# Patient Record
Sex: Male | Born: 1960 | Race: Black or African American | Hispanic: No | Marital: Single | Smoking: Never smoker
Health system: Southern US, Community
[De-identification: ages and names within clinical notes are randomized; demographics above are authoritative.]

## PROBLEM LIST (undated history)

## (undated) DIAGNOSIS — F1910 Other psychoactive substance abuse, uncomplicated: Secondary | ICD-10-CM

## (undated) DIAGNOSIS — N400 Enlarged prostate without lower urinary tract symptoms: Secondary | ICD-10-CM

## (undated) DIAGNOSIS — F32A Depression, unspecified: Secondary | ICD-10-CM

## (undated) DIAGNOSIS — F329 Major depressive disorder, single episode, unspecified: Secondary | ICD-10-CM

---

## 2014-07-19 ENCOUNTER — Encounter (HOSPITAL_COMMUNITY): Payer: Self-pay | Admitting: Emergency Medicine

## 2014-07-19 ENCOUNTER — Emergency Department (HOSPITAL_COMMUNITY)
Admission: EM | Admit: 2014-07-19 | Discharge: 2014-07-19 | Disposition: A | Discharge status: Home / Self Care | Payer: Medicare Other | Attending: Emergency Medicine | Admitting: Emergency Medicine

## 2014-07-19 DIAGNOSIS — F329 Major depressive disorder, single episode, unspecified: Secondary | ICD-10-CM | POA: Diagnosis not present

## 2014-07-19 DIAGNOSIS — L299 Pruritus, unspecified: Secondary | ICD-10-CM | POA: Diagnosis not present

## 2014-07-19 DIAGNOSIS — M2548 Effusion, other site: Secondary | ICD-10-CM | POA: Insufficient documentation

## 2014-07-19 DIAGNOSIS — R609 Edema, unspecified: Secondary | ICD-10-CM | POA: Insufficient documentation

## 2014-07-19 DIAGNOSIS — R21 Rash and other nonspecific skin eruption: Secondary | ICD-10-CM | POA: Insufficient documentation

## 2014-07-19 DIAGNOSIS — F3289 Other specified depressive episodes: Secondary | ICD-10-CM | POA: Diagnosis not present

## 2014-07-19 DIAGNOSIS — Z79899 Other long term (current) drug therapy: Secondary | ICD-10-CM | POA: Diagnosis not present

## 2014-07-19 HISTORY — DX: Major depressive disorder, single episode, unspecified: F32.9

## 2014-07-19 HISTORY — DX: Depression, unspecified: F32.A

## 2014-07-19 LAB — COMPREHENSIVE METABOLIC PANEL
ALT: 23 U/L (ref 0–53)
AST: 24 U/L (ref 0–37)
Albumin: 3.8 g/dL (ref 3.5–5.2)
Alkaline Phosphatase: 39 U/L (ref 39–117)
Anion gap: 14 (ref 5–15)
BILIRUBIN TOTAL: 0.3 mg/dL (ref 0.3–1.2)
BUN: 14 mg/dL (ref 6–23)
CHLORIDE: 101 meq/L (ref 96–112)
CO2: 26 meq/L (ref 19–32)
Calcium: 9.4 mg/dL (ref 8.4–10.5)
Creatinine, Ser: 1.05 mg/dL (ref 0.50–1.35)
GFR calc Af Amer: >90 mL/min (ref >90)
GFR, EST NON AFRICAN AMERICAN: 79 mL/min — AB (ref >90)
Glucose, Bld: 117 mg/dL — ABNORMAL HIGH (ref 70–99)
POTASSIUM: 3.8 meq/L (ref 3.7–5.3)
Sodium: 141 mEq/L (ref 137–147)
Total Protein: 6.8 g/dL (ref 6.0–8.3)

## 2014-07-19 NOTE — ED Notes (Signed)
Pt having bil ankle edema starting last week.  PA at bedside for assessment.

## 2014-07-19 NOTE — Discharge Instructions (Signed)
Please read and follow all provided instructions.  Your diagnoses today include:  1. Peripheral edema     Tests performed today include:  Blood test showing normal liver and kidney function  Vital signs. See below for your results today.   Medications prescribed:   None  Take any prescribed medications only as directed.  Home care instructions:  Follow any educational materials contained in this packet.  Keep legs elevated.  Go to the drug store and get a pair of compression stockings.   BE VERY CAREFUL not to take multiple medicines containing Tylenol (also called acetaminophen). Doing so can lead to an overdose which can damage your liver and cause liver failure and possibly death.   Follow-up instructions: Please follow-up with your primary care provider in the next 3 days for further evaluation of your symptoms.   Return instructions:   Please return to the Emergency Department if you experience worsening symptoms.   Please return if you have any other emergent concerns.  Additional Information:  Your vital signs today were: BP 125/78   Pulse 78   Temp(Src) 98.4 F (36.9 C) (Oral)   Resp 20   SpO2 99% If your blood pressure (BP) was elevated above 135/85 this visit, please have this repeated by your doctor within one month. --------------

## 2014-07-19 NOTE — ED Provider Notes (Signed)
CSN: [id number]     Arrival date & time 07/19/14  1018 History   First MD Initiated Contact with Patient 07/19/14 1020     Chief Complaint  Patient presents with  . Joint Swelling     (Consider location/radiation/quality/duration/timing/severity/associated sxs/prior Treatment) HPI Comments: Patient presents with complaint of bilateral ankle swelling for the past one week. He denies acute injury to these areas. Patient was released from prison one week ago. He had been in prison for one year. She states that since then he has been on his feet and his been walking frequently. He has been sleeping in a chair because there are bed bugs at the place where he is staying and does not want to get bit. He is not having any difficulty breathing or shortness of breath with activity. He does not have orthopnea. Patient also notes a diet change and states that he's been eating salty foods since he was released from prison. He is on no new medications including calcium channel blockers. He has not had any recent surgeries or prolonged immobilization or travel. No history of blood clots. He has no history of kidney or liver failure. Patient does not have pain with ambulation. He does not have redness of the skin of his ankles or lower extremities. Onset of symptoms gradual. Course is constant. Nothing makes symptoms better or worse.  The history is provided by the patient.    Past Medical History  Diagnosis Date  . Depression    History reviewed. No pertinent past surgical history. History reviewed. No pertinent family history. History  Substance Use Topics  . Smoking status: Never Smoker   . Smokeless tobacco: Not on file  . Alcohol Use: No    Review of Systems  Constitutional: Negative for fever.  HENT: Negative for rhinorrhea and sore throat.   Eyes: Negative for redness.  Respiratory: Negative for cough.   Cardiovascular: Negative for chest pain.  Gastrointestinal: Negative for nausea,  vomiting, abdominal pain and diarrhea.  Genitourinary: Negative for dysuria.  Musculoskeletal: Positive for joint swelling. Negative for arthralgias and myalgias.  Skin: Positive for rash (itchy, lower extremities).  Neurological: Negative for headaches.    Allergies  Review of patient's allergies indicates no known allergies.  Home Medications   Prior to Admission medications   Medication Sig Start Date End Date Taking? Authorizing Provider  acetaminophen (TYLENOL) 500 MG tablet Take 1,000 mg by mouth every 6 (six) hours as needed for moderate pain.   Yes Historical Provider, MD  citalopram (CELEXA) 20 MG tablet Take 20 mg by mouth daily.   Yes Historical Provider, MD  hydrOXYzine (VISTARIL) 25 MG capsule Take 25 mg by mouth daily.   Yes Historical Provider, MD   BP 125/78  Pulse 78  Temp(Src) 98.4 F (36.9 C) (Oral)  Resp 20  SpO2 99%  Physical Exam  Nursing note and vitals reviewed. Constitutional: He appears well-developed and well-nourished.  HENT:  Head: Normocephalic and atraumatic.  Eyes: Conjunctivae are normal. Right eye exhibits no discharge. Left eye exhibits no discharge.  Neck: Normal range of motion. Neck supple.  Cardiovascular: Normal rate, regular rhythm and normal heart sounds.   Pulmonary/Chest: Effort normal and breath sounds normal.  Abdominal: Soft. There is no tenderness.  Musculoskeletal:       Right knee: Normal.       Left knee: Normal.       Right ankle: He exhibits swelling. He exhibits normal range of motion. No tenderness. No lateral malleolus and  no medial malleolus tenderness found. Achilles tendon exhibits no pain.       Left ankle: He exhibits swelling. He exhibits normal range of motion. No tenderness. No lateral malleolus and no medial malleolus tenderness found. Achilles tendon normal.       Right lower leg: He exhibits no edema.       Left lower leg: He exhibits no edema.       Right foot: He exhibits normal range of motion, no  tenderness and no bony tenderness.       Left foot: He exhibits normal range of motion, no tenderness and no bony tenderness.  Neurological: He is alert.  Skin: Skin is warm and dry.  Psychiatric: He has a normal mood and affect.    ED Course  Procedures (including critical care time) Labs Review Labs Reviewed  COMPREHENSIVE METABOLIC PANEL    Imaging Review No results found.   EKG Interpretation None      10:50 AM Patient seen and examined. Work-up initiated.   Vital signs reviewed and are as follows: Filed Vitals:   07/19/14 1029  BP: 125/78  Pulse: 78  Temp: 98.4 F (36.9 C)  Resp: 20   Patient states that he has not had general physical in a while. I see no signs of congestive heart failure on exam. Given lack of followup, will check kidney and liver functions to ensure no kidney or liver failure. I feel likely symptoms are due to increase salt intake as well as venous insufficiency. Patient agrees with plan.  12:38 PM CMP okay. Will d/c to home. Counseled on elevation, compression stockings. Decrease salt intake. Hydrate well. Follow-up with PCP, referrals given.  MDM   Final diagnoses:  Peripheral edema   No signs of congestive heart failure. No signs of cirrhosis or nephrotic syndrome. Suspect swelling is due to patient's increase salt intake and him standing up for long periods of time. He should establish care with PCP. No emergency condition suspect at this time. Will discharge to home with conservative measures.    Renne CriglerJoshua Arshia Rondon, PA-C 07/19/14 1239

## 2014-07-20 NOTE — ED Provider Notes (Signed)
Medical screening examination/treatment/procedure(s) were performed by non-physician practitioner and as supervising physician I was immediately available for consultation/collaboration.   Kadyn Chovan David Karmin Kasprzak III, MD 07/20/14 0831 

## 2014-10-19 ENCOUNTER — Encounter (HOSPITAL_COMMUNITY): Payer: Self-pay | Admitting: Emergency Medicine

## 2014-10-19 ENCOUNTER — Emergency Department (INDEPENDENT_AMBULATORY_CARE_PROVIDER_SITE_OTHER)
Admission: EM | Admit: 2014-10-19 | Discharge: 2014-10-19 | Disposition: A | Discharge status: Home / Self Care | Payer: Medicare Other | Source: Home / Self Care | Attending: Family Medicine | Admitting: Family Medicine

## 2014-10-19 DIAGNOSIS — F3178 Bipolar disorder, in full remission, most recent episode mixed: Secondary | ICD-10-CM

## 2014-10-19 DIAGNOSIS — M25562 Pain in left knee: Secondary | ICD-10-CM

## 2014-10-19 MED ORDER — DICLOFENAC SODIUM 75 MG PO TBEC: 75.0000 mg | DELAYED_RELEASE_TABLET | Freq: Two times a day (BID) | ORAL | Status: DC

## 2014-10-19 MED ORDER — DICLOFENAC SODIUM 1 % TD GEL: 2.0000 g | Freq: Four times a day (QID) | TRANSDERMAL | Status: DC

## 2014-10-19 NOTE — ED Provider Notes (Signed)
CSN: [id number]     Arrival date & time 10/19/14  1450 History   First MD Initiated Contact with Patient 10/19/14 1535     Chief Complaint  Patient presents with  . Knee Pain   (Consider location/radiation/quality/duration/timing/severity/associated sxs/prior Treatment) HPI  Knee pain: L. Sw[facility]. Mild pain w/ prolonged walking. Worse w/ full hard extension. Hot tub w/ benefit. Recently started working out from being sedentary. Went really hard on treadmilll and bike. Has not taken any medications for the pain. No change in overall symptoms.   Bipolar: on celexa and hydroxysine prn. No recent mania. Has psych in town. Would like to avoid any medications that may change his mood.   Past Medical History  Diagnosis Date  . Depression    History reviewed. No pertinent past surgical history. No family history on file. History  Substance Use Topics  . Smoking status: Never Smoker   . Smokeless tobacco: Not on file  . Alcohol Use: No    Review of Systems Per HPI with all other pertinent systems negative.   Allergies  Review of patient's allergies indicates no known allergies.  Home Medications   Prior to Admission medications   Medication Sig Start Date End Date Taking? Authorizing Provider  acetaminophen (TYLENOL) 500 MG tablet Take 1,000 mg by mouth every 6 (six) hours as needed for moderate pain.    Historical Provider, MD  citalopram (CELEXA) 20 MG tablet Take 20 mg by mouth daily.    Historical Provider, MD  diclofenac (VOLTAREN) 75 MG EC tablet Take 1 tablet (75 mg total) by mouth 2 (two) times daily. 10/19/14   Ozella Rocksavid J Shyniece Scripter, MD  diclofenac sodium (VOLTAREN) 1 % GEL Apply 2-4 g topically 4 (four) times daily. 10/19/14   Ozella Rocksavid J Jakyiah Briones, MD  hydrOXYzine (VISTARIL) 25 MG capsule Take 25 mg by mouth daily.    Historical Provider, MD   BP 128/87  Pulse 72  Temp(Src) 97.8 F (36.6 C) (Oral)  Resp 16  SpO2 98% Physical Exam   Constitutional: He is oriented to person, place, and time. He appears well-developed and well-nourished.  HENT:  Head: Normocephalic and atraumatic.  Eyes: EOM are normal. Pupils are equal, round, and reactive to light.  Neck: Normal range of motion.  Cardiovascular: Normal rate and regular rhythm.   Pulmonary/Chest: Effort normal. No respiratory distress.  Abdominal: He exhibits no distension.  Musculoskeletal: Normal range of motion. He exhibits no tenderness.  Neurological: He is alert and oriented to person, place, and time. No cranial nerve deficit. Coordination normal.  Skin: Skin is warm.  Psychiatric: He has a normal mood and affect. His behavior is normal. Judgment and thought content normal.    ED Course  Procedures (including critical care time) Labs Review Labs Reviewed - No data to display  Imaging Review No results found.   MDM   1. Knee pain, acute, left   2. Bipolar 1 disorder, mixed, full remission    Knee pain: exam consistent w./ mild meniscal injury vs MCL strain.  Voltaren (PO or gel - not at the same time), rehab exercises diwcussed (no steroids), f/u new pcp if not better in 2-4 wks for imaging - Prilosec or zantac prn Gastritis  Bipolar: pt currently stable and happy w/ current regimen. Close f/u w/ psych PRN. No steroids at this time.   Precautions given and all questions answered   Shelly Flattenavid Mayli Covington, MD Family Medicine 10/19/2014, 4:10 PM   -  Bipolar    Ozella Rocksavid J Rad Gramling, MD 10/19/14 [phone]

## 2014-10-19 NOTE — ED Notes (Signed)
Patient c/o  Left knee pain onset about a week ago. Patient reports he does work out a lot. Patient states his trainer told him he may have sprained it or torn a ligament. Patient ambulated to room ok. NAD.

## 2014-10-19 NOTE — Discharge Instructions (Signed)
You knee pain is likely due to either a smal meniscal injury or a mild MCL strain Please start the voltaren twice a day (if this bothers your stomach then start an acid reducer such as prilosec or only use the voltaren gel).  Please stop your aerobic weight bearing activities for 2 weeks then return to activity slowly. Please start twice daily range of motion exercises (leg extensions) of both legs. 3 sets of 15.  Please come back or follow up at your new pcp in 2 weeks Please call you insurance to confirm your new doctor Please apply ice to your knee 2-4 times per day for 20-30 min for the next 48 hours. Then apply heat

## 2015-02-08 DIAGNOSIS — Z79899 Other long term (current) drug therapy: Secondary | ICD-10-CM | POA: Diagnosis not present

## 2015-02-08 DIAGNOSIS — Z5181 Encounter for therapeutic drug level monitoring: Secondary | ICD-10-CM | POA: Diagnosis not present

## 2015-03-06 DIAGNOSIS — Z5181 Encounter for therapeutic drug level monitoring: Secondary | ICD-10-CM | POA: Diagnosis not present

## 2015-03-06 DIAGNOSIS — Z79899 Other long term (current) drug therapy: Secondary | ICD-10-CM | POA: Diagnosis not present

## 2015-05-13 DIAGNOSIS — Z23 Encounter for immunization: Secondary | ICD-10-CM | POA: Diagnosis not present

## 2015-05-31 DIAGNOSIS — Z79899 Other long term (current) drug therapy: Secondary | ICD-10-CM | POA: Diagnosis not present

## 2015-05-31 DIAGNOSIS — Z5181 Encounter for therapeutic drug level monitoring: Secondary | ICD-10-CM | POA: Diagnosis not present

## 2015-06-10 DIAGNOSIS — Z23 Encounter for immunization: Secondary | ICD-10-CM | POA: Diagnosis not present

## 2015-06-23 ENCOUNTER — Encounter (HOSPITAL_COMMUNITY): Payer: Self-pay | Admitting: Emergency Medicine

## 2015-06-23 ENCOUNTER — Emergency Department (HOSPITAL_COMMUNITY)
Admission: EM | Admit: 2015-06-23 | Discharge: 2015-06-23 | Disposition: A | Discharge status: Home / Self Care | Payer: Medicaid Other | Attending: Emergency Medicine | Admitting: Emergency Medicine

## 2015-06-23 ENCOUNTER — Emergency Department (HOSPITAL_COMMUNITY): Payer: Medicaid Other

## 2015-06-23 DIAGNOSIS — Y998 Other external cause status: Secondary | ICD-10-CM | POA: Insufficient documentation

## 2015-06-23 DIAGNOSIS — Y9389 Activity, other specified: Secondary | ICD-10-CM | POA: Diagnosis not present

## 2015-06-23 DIAGNOSIS — Z791 Long term (current) use of non-steroidal anti-inflammatories (NSAID): Secondary | ICD-10-CM | POA: Diagnosis not present

## 2015-06-23 DIAGNOSIS — F329 Major depressive disorder, single episode, unspecified: Secondary | ICD-10-CM | POA: Insufficient documentation

## 2015-06-23 DIAGNOSIS — Y92410 Unspecified street and highway as the place of occurrence of the external cause: Secondary | ICD-10-CM | POA: Insufficient documentation

## 2015-06-23 DIAGNOSIS — S8002XA Contusion of left knee, initial encounter: Secondary | ICD-10-CM | POA: Diagnosis not present

## 2015-06-23 DIAGNOSIS — Z79899 Other long term (current) drug therapy: Secondary | ICD-10-CM | POA: Insufficient documentation

## 2015-06-23 DIAGNOSIS — S8992XA Unspecified injury of left lower leg, initial encounter: Secondary | ICD-10-CM | POA: Diagnosis present

## 2015-06-23 MED ORDER — NAPROXEN 500 MG PO TABS: 500.0000 mg | ORAL_TABLET | Freq: Two times a day (BID) | ORAL | Status: DC
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
## 2015-06-23 NOTE — Discharge Instructions (Signed)
Cryotherapy °Cryotherapy means treatment with cold. Ice or gel packs can be used to reduce both pain and swelling. Ice is the most helpful within the first 24 to 48 hours after an injury or flare-up from overusing a muscle or joint. Sprains, strains, spasms, burning pain, shooting pain, and aches can all be eased with ice. Ice can also be used when recovering from surgery. Ice is effective, has very few side effects, and is safe for most people to use. °PRECAUTIONS  °Ice is not a safe treatment option for people with: °· Raynaud phenomenon. This is a condition affecting small blood vessels in the extremities. Exposure to cold may cause your problems to return. °· Cold hypersensitivity. There are many forms of cold hypersensitivity, including: °· Cold urticaria. Red, itchy hives appear on the skin when the tissues begin to warm after being iced. °· Cold erythema. This is a red, itchy rash caused by exposure to cold. °· Cold hemoglobinuria. Red blood cells break down when the tissues begin to warm after being iced. The hemoglobin that carry oxygen are passed into the urine because they cannot combine with blood proteins fast enough. °· Numbness or altered sensitivity in the area being iced. °If you have any of the following conditions, do not use ice until you have discussed cryotherapy with your caregiver: °· Heart conditions, such as arrhythmia, angina, or chronic heart disease. °· High blood pressure. °· Healing wounds or open skin in the area being iced. °· Current infections. °· Rheumatoid arthritis. °· Poor circulation. °· Diabetes. °Ice slows the blood flow in the region it is applied. This is beneficial when trying to stop inflamed tissues from spreading irritating chemicals to surrounding tissues. However, if you expose your skin to cold temperatures for too long or without the proper protection, you can damage your skin or nerves. Watch for signs of skin damage due to cold. °HOME CARE INSTRUCTIONS °Follow  these tips to use ice and cold packs safely. °· Place a dry or damp towel between the ice and skin. A damp towel will cool the skin more quickly, so you may need to shorten the time that the ice is used. °· For a more rapid response, add gentle compression to the ice. °· Ice for no more than 10 to 20 minutes at a time. The bonier the area you are icing, the less time it will take to get the benefits of ice. °· Check your skin after 5 minutes to make sure there are no signs of a poor response to cold or skin damage. °· Rest 20 minutes or more between uses. °· Once your skin is numb, you can end your treatment. You can test numbness by very lightly touching your skin. The touch should be so light that you do not see the skin dimple from the pressure of your fingertip. When using ice, most people will feel these normal sensations in this order: cold, burning, aching, and numbness. °· Do not use ice on someone who cannot communicate their responses to pain, such as small children or people with dementia. °HOW TO MAKE AN ICE PACK °Ice packs are the most common way to use ice therapy. Other methods include ice massage, ice baths, and cryosprays. Muscle creams that cause a cold, tingly feeling do not offer the same benefits that ice offers and should not be used as a substitute unless recommended by your caregiver. °To make an ice pack, do one of the following: °· Place crushed ice or a   bag of frozen vegetables in a sealable plastic bag. Squeeze out the excess air. Place this bag inside another plastic bag. Slide the bag into a pillowcase or place a damp towel between your skin and the bag. °· Mix 3 parts water with 1 part rubbing alcohol. Freeze the mixture in a sealable plastic bag. When you remove the mixture from the freezer, it will be slushy. Squeeze out the excess air. Place this bag inside another plastic bag. Slide the bag into a pillowcase or place a damp towel between your skin and the bag. °SEEK MEDICAL CARE  IF: °· You develop white spots on your skin. This may give the skin a blotchy (mottled) appearance. °· Your skin turns blue or pale. °· Your skin becomes waxy or hard. °· Your swelling gets worse. °MAKE SURE YOU:  °· Understand these instructions. °· Will watch your condition. °· Will get help right away if you are not doing well or get worse. °Document Released: 08/10/2011 Document Revised: 04/30/2014 Document Reviewed: 08/10/2011 °Exit[employer]® Patient Information ©2015 [employer]. This information is not intended to replace advice given to you by your health care provider. Make sure you discuss any questions you have with your health care provider. ° °Contusion °A contusion is a deep bruise. Contusions are the result of an injury that caused bleeding under the skin. The contusion may turn blue, purple, or yellow. Minor injuries will give you a painless contusion, but more severe contusions may stay painful and swollen for a few weeks.  °CAUSES  °A contusion is usually caused by a blow, trauma, or direct force to an area of the body. °SYMPTOMS  °· Swelling and redness of the injured area. °· Bruising of the injured area. °· Tenderness and soreness of the injured area. °· Pain. °DIAGNOSIS  °The diagnosis can be made by taking a history and physical exam. An X-ray, CT scan, or MRI may be needed to determine if there were any associated injuries, such as fractures. °TREATMENT  °Specific treatment will depend on what area of the body was injured. In general, the best treatment for a contusion is resting, icing, elevating, and applying cold compresses to the injured area. Over-the-counter medicines may also be recommended for pain control. Ask your caregiver what the best treatment is for your contusion. °HOME CARE INSTRUCTIONS  °· Put ice on the injured area. °¨ Put ice in a plastic bag. °¨ Place a towel between your skin and the bag. °¨ Leave the ice on for 15-20 minutes, 3-4 times a day, or as directed by your health  care provider. °· Only take over-the-counter or prescription medicines for pain, discomfort, or fever as directed by your caregiver. Your caregiver may recommend avoiding anti-inflammatory medicines (aspirin, ibuprofen, and naproxen) for 48 hours because these medicines may increase bruising. °· Rest the injured area. °· If possible, elevate the injured area to reduce swelling. °SEEK IMMEDIATE MEDICAL CARE IF:  °· You have increased bruising or swelling. °· You have pain that is getting worse. °· Your swelling or pain is not relieved with medicines. °MAKE SURE YOU:  °· Understand these instructions. °· Will watch your condition. °· Will get help right away if you are not doing well or get worse. °Document Released: 09/23/2005 Document Revised: 12/19/2013 Document Reviewed: 10/19/2011 °ExitCare® Patient Information ©2015 [employer]. This information is not intended to replace advice given to you by your health care provider. Make sure you discuss any questions you have with your health care provider. ° °

## 2015-08-07 DIAGNOSIS — Z79899 Other long term (current) drug therapy: Secondary | ICD-10-CM | POA: Diagnosis not present

## 2015-08-07 DIAGNOSIS — Z5181 Encounter for therapeutic drug level monitoring: Secondary | ICD-10-CM | POA: Diagnosis not present

## 2015-08-13 ENCOUNTER — Encounter (HOSPITAL_COMMUNITY): Payer: Self-pay | Admitting: Emergency Medicine

## 2015-08-13 ENCOUNTER — Emergency Department (HOSPITAL_COMMUNITY): Payer: Medicaid Other

## 2015-08-13 ENCOUNTER — Emergency Department (HOSPITAL_COMMUNITY)
Admission: EM | Admit: 2015-08-13 | Discharge: 2015-08-13 | Disposition: A | Discharge status: Home / Self Care | Payer: Medicaid Other | Attending: Emergency Medicine | Admitting: Emergency Medicine

## 2015-08-13 DIAGNOSIS — Y9389 Activity, other specified: Secondary | ICD-10-CM | POA: Insufficient documentation

## 2015-08-13 DIAGNOSIS — F329 Major depressive disorder, single episode, unspecified: Secondary | ICD-10-CM | POA: Insufficient documentation

## 2015-08-13 DIAGNOSIS — M70831 Other soft tissue disorders related to use, overuse and pressure, right forearm: Secondary | ICD-10-CM | POA: Insufficient documentation

## 2015-08-13 DIAGNOSIS — Z79899 Other long term (current) drug therapy: Secondary | ICD-10-CM | POA: Diagnosis not present

## 2015-08-13 DIAGNOSIS — M25521 Pain in right elbow: Secondary | ICD-10-CM | POA: Diagnosis present

## 2015-08-13 DIAGNOSIS — M778 Other enthesopathies, not elsewhere classified: Secondary | ICD-10-CM | POA: Diagnosis not present

## 2015-08-13 MED ORDER — PREDNISONE 10 MG PO TABS: 20.0000 mg | ORAL_TABLET | Freq: Every day | ORAL | Status: DC

## 2015-08-13 MED ORDER — NAPROXEN 500 MG PO TABS: 500.0000 mg | ORAL_TABLET | Freq: Three times a day (TID) | ORAL | Status: DC

## 2015-08-13 NOTE — ED Notes (Signed)
Ortho at bedside to place brace.

## 2015-08-13 NOTE — Discharge Instructions (Signed)
Tendinitis Tendinitis is swelling and inflammation of the tendons. Tendons are band-like tissues that connect muscle to bone. Tendinitis commonly occurs in the:   Shoulders (rotator cuff).  Heels (Achilles tendon).  Elbows (triceps tendon). CAUSES Tendinitis is usually caused by overusing the tendon, muscles, and joints involved. When the tissue surrounding a tendon (synovium) becomes inflamed, it is called tenosynovitis. Tendinitis commonly develops in people whose jobs require repetitive motions. SYMPTOMS  Pain.  Tenderness.  Mild swelling. DIAGNOSIS Tendinitis is usually diagnosed by physical exam. Your health care provider may also order X-rays or other imaging tests. TREATMENT Your health care provider may recommend certain medicines or exercises for your treatment. HOME CARE INSTRUCTIONS   Use a sling or splint for as long as directed by your health care provider until the pain decreases.  Put ice on the injured area.  Put ice in a plastic bag.  Place a towel between your skin and the bag.  Leave the ice on for 15-20 minutes, 3-4 times a day, or as directed by your health care provider.  Avoid using the limb while the tendon is painful. Perform gentle range of motion exercises only as directed by your health care provider. Stop exercises if pain or discomfort increase, unless directed otherwise by your health care provider.  Only take over-the-counter or prescription medicines for pain, discomfort, or fever as directed by your health care provider. SEEK MEDICAL CARE IF:   Your pain and swelling increase.  You develop new, unexplained symptoms, especially increased numbness in the hands. MAKE SURE YOU:   Understand these instructions.  Will watch your condition.  Will get help right away if you are not doing well or get worse. Document Released: 12/11/2000 Document Revised: 04/30/2014 Document Reviewed: 03/02/2011 [facility] Patient Information 2015 [place],  [place]. This information is not intended to replace advice given to you by your health care provider. Make sure you discuss any questions you have with your health care provider. Make an appointment with the orthopedist fro evaluation or you PCP to get into a physical therapy program

## 2015-08-13 NOTE — ED Notes (Signed)
Pt states right elbow pain with movement. 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 Been going on for 3 months. Been trying heat and ice. States he tried to get in to see his PCP but could not see her.

## 2015-08-13 NOTE — ED Provider Notes (Signed)
CSN: [id number]     Arrival date & time 08/13/15  1258 History  This chart was scribed for Christopher Favor, NP working with Linwood Dibbles, MD by Placido Sou, ED Scribe. This patient was seen in room TR07C/TR07C and the patient's care was started at 2:27 PM.     Chief Complaint  Patient presents with  . Joint Swelling   The history is provided by the patient. No language interpreter was used.    HPI Comments: [name] is a 54 y.o. male who presents to the Emergency Department complaining of constant, mild, left elbow pain with onset 4 months ago. Pt notes a worsening of his pain with movent and palpation and further notes applying ice, heat and icy hot topical cream which has provided little relief. 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 He also notes he has an appointment with his PCP in 1 week. He denies any trauma or associated symptoms.   Past Medical History  Diagnosis Date  . Depression   . Depression    History reviewed. No pertinent past surgical history. History reviewed. No pertinent family history. Social History  Substance Use Topics  . Smoking status: Never Smoker   . Smokeless tobacco: None  . Alcohol Use: No    Review of Systems  Constitutional: Negative for fever.  Musculoskeletal: Positive for arthralgias. Negative for joint swelling.  Skin: Negative for wound.  Neurological: Negative for numbness.  All other systems reviewed and are negative.   Allergies  Review of patient's allergies indicates no known allergies.  Home Medications   Prior to Admission medications   Medication Sig Start Date End Date Taking? Authorizing Provider  acetaminophen (TYLENOL) 500 MG tablet Take 1,000 mg by mouth every 6 (six) hours as needed for moderate pain.    Historical Provider, MD  citalopram (CELEXA) 20 MG tablet Take 20 mg by mouth daily.    Historical  Provider, MD  diclofenac (VOLTAREN) 75 MG EC tablet Take 1 tablet (75 mg total) by mouth 2 (two) times daily. 10/19/14   Ozella Rocks, MD  diclofenac sodium (VOLTAREN) 1 % GEL Apply 2-4 g topically 4 (four) times daily. 10/19/14   Ozella Rocks, MD  hydrOXYzine (VISTARIL) 25 MG capsule Take 25 mg by mouth daily.    Historical Provider, MD  naproxen (NAPROSYN) 500 MG tablet Take 1 tablet (500 mg total) by mouth 3 (three) times daily with meals. 08/13/15   Christopher Favor, NP  predniSONE (DELTASONE) 10 MG tablet Take 2 tablets (20 mg total) by mouth daily. 08/13/15   Christopher Favor, NP   BP 112/77 mmHg  Pulse 60  Temp(Src) 97.6 F (36.4 C) (Oral)  Resp 16  SpO2 97% Physical Exam  Constitutional: He is oriented to person, place, and time. He appears well-developed and well-nourished. No distress.  HENT:  Head: Normocephalic and atraumatic.  Eyes: Conjunctivae and EOM are normal. Pupils are equal, round, and reactive to light.  Neck: Normal range of motion. Neck supple. No tracheal deviation present.  Cardiovascular: Normal rate.   Pulmonary/Chest: Effort normal.  Abdominal: Soft.  Musculoskeletal: Normal range of motion. He exhibits tenderness.       Left elbow: He exhibits normal range of motion, no swelling, no effusion, no deformity and no laceration. Tenderness found. No medial epicondyle and no lateral epicondyle tenderness noted.  Exam is consistent with tendinitis  Neurological: He  is alert and oriented to person, place, and time.  Skin: Skin is warm and dry.  Psychiatric: He has a normal mood and affect. His behavior is normal.  Nursing note and vitals reviewed.   ED Course  Procedures  DIAGNOSTIC STUDIES: Oxygen Saturation is 97% on RA, normal by my interpretation.    COORDINATION OF CARE: 2:30 PM Discussed treatment plan with pt at bedside and pt agreed to plan.  Labs Review Labs Reviewed - No data to display  Imaging Review Dg Elbow Complete Right  08/13/2015    CLINICAL DATA:  Right elbow pain for 3 months.  EXAM: RIGHT ELBOW - COMPLETE 3+ VIEW  COMPARISON:  None.  FINDINGS: There is no evidence of fracture, dislocation, or joint effusion. There is no evidence of arthropathy or other focal bone abnormality. Soft tissues are unremarkable.  IMPRESSION: Negative.   Electronically Signed   By: Charlett Nose M.D.   On: 08/13/2015 14:43   I have personally reviewed and evaluated these images and lab results as part of my medical decision-making.   EKG Interpretation None      MDM   Final diagnoses:  Tendonitis of elbow, right    I personally performed the services described in this documentation, which was scribed in my presence. The recorded information has been reviewed and is accurate.    Christopher Favor, NP 08/13/15 1503  Linwood Dibbles, MD 08/14/15 1131

## 2015-08-13 NOTE — Progress Notes (Signed)
Orthopedic Tech Progress Note Patient Details:  Christopher Dominguez October 07, 1961 [id number]  Ortho Devices Type of Ortho Device: Knee Sleeve Ortho Device/Splint Location: rue Ortho Device/Splint Interventions: Application   [name] 08/13/2015, 2:59 PM

## 2015-08-13 NOTE — ED Notes (Signed)
Patient transported to X-ray 

## 2015-08-19 DIAGNOSIS — M65222 Calcific tendinitis, left upper arm: Secondary | ICD-10-CM | POA: Diagnosis not present

## 2015-08-19 DIAGNOSIS — E6609 Other obesity due to excess calories: Secondary | ICD-10-CM | POA: Diagnosis not present

## 2015-08-19 DIAGNOSIS — M65231 Calcific tendinitis, right forearm: Secondary | ICD-10-CM | POA: Diagnosis not present

## 2015-08-27 DIAGNOSIS — M25521 Pain in right elbow: Secondary | ICD-10-CM | POA: Diagnosis not present

## 2015-08-30 DIAGNOSIS — M25521 Pain in right elbow: Secondary | ICD-10-CM | POA: Diagnosis not present

## 2015-09-04 DIAGNOSIS — M65222 Calcific tendinitis, left upper arm: Secondary | ICD-10-CM | POA: Diagnosis not present

## 2015-09-04 DIAGNOSIS — E6609 Other obesity due to excess calories: Secondary | ICD-10-CM | POA: Diagnosis not present

## 2015-09-04 DIAGNOSIS — M25521 Pain in right elbow: Secondary | ICD-10-CM | POA: Diagnosis not present

## 2015-09-04 DIAGNOSIS — M545 Low back pain: Secondary | ICD-10-CM | POA: Diagnosis not present

## 2015-09-06 DIAGNOSIS — M25521 Pain in right elbow: Secondary | ICD-10-CM | POA: Diagnosis not present

## 2015-09-10 DIAGNOSIS — M25521 Pain in right elbow: Secondary | ICD-10-CM | POA: Diagnosis not present

## 2015-09-13 DIAGNOSIS — M25521 Pain in right elbow: Secondary | ICD-10-CM | POA: Diagnosis not present

## 2015-09-16 DIAGNOSIS — M25521 Pain in right elbow: Secondary | ICD-10-CM | POA: Diagnosis not present

## 2015-09-20 DIAGNOSIS — M25521 Pain in right elbow: Secondary | ICD-10-CM | POA: Diagnosis not present

## 2015-09-23 DIAGNOSIS — M25521 Pain in right elbow: Secondary | ICD-10-CM | POA: Diagnosis not present

## 2015-09-25 DIAGNOSIS — M25521 Pain in right elbow: Secondary | ICD-10-CM | POA: Diagnosis not present

## 2015-11-13 DIAGNOSIS — E669 Obesity, unspecified: Secondary | ICD-10-CM | POA: Diagnosis not present

## 2015-11-13 DIAGNOSIS — M545 Low back pain: Secondary | ICD-10-CM | POA: Diagnosis not present

## 2015-11-13 DIAGNOSIS — Z0000 Encounter for general adult medical examination without abnormal findings: Secondary | ICD-10-CM | POA: Diagnosis not present

## 2015-11-13 DIAGNOSIS — F331 Major depressive disorder, recurrent, moderate: Secondary | ICD-10-CM | POA: Diagnosis not present

## 2015-11-20 DIAGNOSIS — M545 Low back pain: Secondary | ICD-10-CM | POA: Diagnosis not present

## 2015-11-25 DIAGNOSIS — M545 Low back pain: Secondary | ICD-10-CM | POA: Diagnosis not present

## 2015-11-27 DIAGNOSIS — M545 Low back pain: Secondary | ICD-10-CM | POA: Diagnosis not present

## 2015-12-09 DIAGNOSIS — M545 Low back pain: Secondary | ICD-10-CM | POA: Diagnosis not present

## 2015-12-11 DIAGNOSIS — M545 Low back pain: Secondary | ICD-10-CM | POA: Diagnosis not present

## 2015-12-18 DIAGNOSIS — M533 Sacrococcygeal disorders, not elsewhere classified: Secondary | ICD-10-CM | POA: Diagnosis not present

## 2016-01-01 DIAGNOSIS — Z5181 Encounter for therapeutic drug level monitoring: Secondary | ICD-10-CM | POA: Diagnosis not present

## 2016-01-01 DIAGNOSIS — Z79899 Other long term (current) drug therapy: Secondary | ICD-10-CM | POA: Diagnosis not present

## 2016-05-09 ENCOUNTER — Emergency Department (HOSPITAL_COMMUNITY)
Admission: EM | Admit: 2016-05-09 | Discharge: 2016-05-09 | Disposition: A | Discharge status: Home / Self Care | Payer: Medicare Other | Attending: Emergency Medicine | Admitting: Emergency Medicine

## 2016-05-09 ENCOUNTER — Emergency Department (HOSPITAL_COMMUNITY): Payer: Medicare Other

## 2016-05-09 ENCOUNTER — Encounter (HOSPITAL_COMMUNITY): Payer: Self-pay | Admitting: Emergency Medicine

## 2016-05-09 DIAGNOSIS — Z791 Long term (current) use of non-steroidal anti-inflammatories (NSAID): Secondary | ICD-10-CM | POA: Diagnosis not present

## 2016-05-09 DIAGNOSIS — F329 Major depressive disorder, single episode, unspecified: Secondary | ICD-10-CM | POA: Insufficient documentation

## 2016-05-09 DIAGNOSIS — K625 Hemorrhage of anus and rectum: Secondary | ICD-10-CM | POA: Diagnosis not present

## 2016-05-09 DIAGNOSIS — Z7952 Long term (current) use of systemic steroids: Secondary | ICD-10-CM | POA: Diagnosis not present

## 2016-05-09 DIAGNOSIS — K5730 Diverticulosis of large intestine without perforation or abscess without bleeding: Secondary | ICD-10-CM | POA: Diagnosis not present

## 2016-05-09 DIAGNOSIS — Z79899 Other long term (current) drug therapy: Secondary | ICD-10-CM | POA: Insufficient documentation

## 2016-05-09 DIAGNOSIS — K5900 Constipation, unspecified: Secondary | ICD-10-CM | POA: Insufficient documentation

## 2016-05-09 DIAGNOSIS — K6289 Other specified diseases of anus and rectum: Secondary | ICD-10-CM | POA: Insufficient documentation

## 2016-05-09 DIAGNOSIS — R109 Unspecified abdominal pain: Secondary | ICD-10-CM | POA: Diagnosis not present

## 2016-05-09 LAB — URINALYSIS, ROUTINE W REFLEX MICROSCOPIC
Bilirubin Urine: NEGATIVE
Glucose, UA: NEGATIVE mg/dL
Hgb urine dipstick: NEGATIVE
Ketones, ur: NEGATIVE mg/dL
LEUKOCYTES UA: NEGATIVE
NITRITE: NEGATIVE
Protein, ur: NEGATIVE mg/dL
SPECIFIC GRAVITY, URINE: 1.019 (ref 1.005–1.030)
pH: 7 (ref 5.0–8.0)

## 2016-05-09 LAB — CBC
HCT: 40.3 % (ref 39.0–52.0)
Hemoglobin: 12.5 g/dL — ABNORMAL LOW (ref 13.0–17.0)
MCH: 21.5 pg — ABNORMAL LOW (ref 26.0–34.0)
MCHC: 31 g/dL (ref 30.0–36.0)
MCV: 69.2 fL — AB (ref 78.0–100.0)
PLATELETS: 168 10*3/uL (ref 150–400)
RBC: 5.82 MIL/uL — ABNORMAL HIGH (ref 4.22–5.81)
RDW: 16.6 % — AB (ref 11.5–15.5)
WBC: 4.3 10*3/uL (ref 4.0–10.5)

## 2016-05-09 LAB — COMPREHENSIVE METABOLIC PANEL
ALT: 20 U/L (ref 17–63)
AST: 17 U/L (ref 15–41)
Albumin: 3.3 g/dL — ABNORMAL LOW (ref 3.5–5.0)
Alkaline Phosphatase: 34 U/L — ABNORMAL LOW (ref 38–126)
Anion gap: 8 (ref 5–15)
BUN: 11 mg/dL (ref 6–20)
CO2: 24 mmol/L (ref 22–32)
CREATININE: 1.23 mg/dL (ref 0.61–1.24)
Calcium: 8.6 mg/dL — ABNORMAL LOW (ref 8.9–10.3)
Chloride: 108 mmol/L (ref 101–111)
GFR calc Af Amer: >60 mL/min (ref >60)
Glucose, Bld: 96 mg/dL (ref 65–99)
Potassium: 3.7 mmol/L (ref 3.5–5.1)
Sodium: 140 mmol/L (ref 135–145)
Total Bilirubin: 0.8 mg/dL (ref 0.3–1.2)
Total Protein: 5.5 g/dL — ABNORMAL LOW (ref 6.5–8.1)

## 2016-05-09 LAB — ABO/RH: ABO/RH(D): O POS

## 2016-05-09 LAB — TYPE AND SCREEN
ABO/RH(D): O POS
Antibody Screen: NEGATIVE

## 2016-05-09 LAB — LIPASE, BLOOD: Lipase: 42 U/L (ref 11–51)

## 2016-05-09 LAB — POC OCCULT BLOOD, ED: Fecal Occult Bld: POSITIVE — AB

## 2016-05-09 MED ORDER — DOCUSATE SODIUM 100 MG PO CAPS: 100.0000 mg | ORAL_CAPSULE | Freq: Two times a day (BID) | ORAL | Status: DC

## 2016-05-09 MED ORDER — IOPAMIDOL (ISOVUE-300) INJECTION 61%
INTRAVENOUS | Status: AC
  Administered 2016-05-09: 100 mL
  Filled 2016-05-09: qty 100

## 2016-05-09 NOTE — ED Provider Notes (Signed)
CSN: [id number]     Arrival date & time 05/09/16  1216 History   First MD Initiated Contact with Patient 05/09/16 1241     Chief Complaint  Patient presents with  . Blood In Stools  . Abdominal Pain     (Consider location/radiation/quality/duration/timing/severity/associated sxs/prior Treatment) HPI Comments: Patient reports intermittent blood in the stool for the past 5 or 6 months but worsening over the past 2 days. States he's had brown bowel movements with blood on the outside, blood with wiping, blood in toilet bowl and blood clots today and yesterday. Admits to straining on the toilet having rectal pain with bowel movements.denies any vomiting or fever. States he had a colonoscopy 4 years ago that showed a small polyp. Denies any alcohol abuse. Denies any NSAID abuse. Denies any chest pain or shortness of breath. Denies any abdominal pain. Endorses good appetite. States his stools have been brown. He does straining on the toilet and today he had some clots in the toilet bowl after having a brown bowel movement that was coated with red blood. Denies any dizziness or lightheadedness currently.  Patient is a 55 y.o. male presenting with abdominal pain. The history is provided by the patient.  Abdominal Pain Associated symptoms: constipation   Associated symptoms: no chest pain, no cough, no diarrhea, no dysuria, no fatigue, no fever, no hematuria, no nausea, no shortness of breath and no vomiting     Past Medical History  Diagnosis Date  . Depression   . Depression    History reviewed. No pertinent past surgical history. History reviewed. No pertinent family history. Social History  Substance Use Topics  . Smoking status: Never Smoker   . Smokeless tobacco: None  . Alcohol Use: No    Review of Systems  Constitutional: Positive for activity change and appetite change. Negative for fever and fatigue.  Respiratory: Negative for cough, chest tightness and shortness of breath.    Cardiovascular: Negative for chest pain.  Gastrointestinal: Positive for abdominal pain, constipation, blood in stool and rectal pain. Negative for nausea, vomiting and diarrhea.  Genitourinary: Negative for dysuria, hematuria and testicular pain.  Musculoskeletal: Negative for myalgias and arthralgias.  Skin: Negative for rash.  Neurological: Negative for dizziness, weakness and headaches.  A complete 10 system review of systems was obtained and all systems are negative except as noted in the HPI and PMH.      Allergies  Review of patient's allergies indicates no known allergies.  Home Medications   Prior to Admission medications   Medication Sig Start Date End Date Taking? Authorizing Provider  acetaminophen (TYLENOL) 500 MG tablet Take 1,000 mg by mouth every 6 (six) hours as needed for moderate pain.    Historical Provider, MD  citalopram (CELEXA) 20 MG tablet Take 20 mg by mouth daily.    Historical Provider, MD  diclofenac (VOLTAREN) 75 MG EC tablet Take 1 tablet (75 mg total) by mouth 2 (two) times daily. 10/19/14   Ozella Rocks, MD  diclofenac sodium (VOLTAREN) 1 % GEL Apply 2-4 g topically 4 (four) times daily. 10/19/14   Ozella Rocks, MD  hydrOXYzine (VISTARIL) 25 MG capsule Take 25 mg by mouth daily.    Historical Provider, MD  naproxen (NAPROSYN) 500 MG tablet Take 1 tablet (500 mg total) by mouth 3 (three) times daily with meals. 08/13/15   Earley Favor, NP  predniSONE (DELTASONE) 10 MG tablet Take 2 tablets (20 mg total) by mouth daily. 08/13/15   Earley Favor, NP  BP 122/82 mmHg  Pulse 54  Temp(Src) 98.8 F (37.1 C) (Oral)  Resp 16  Ht 5\' 11"  (1.803 m)  Wt 217 lb 8 oz (98.657 kg)  BMI 30.35 kg/m2  SpO2 99% Physical Exam  Constitutional: He is oriented to person, place, and time. He appears well-developed and well-nourished. No distress.  No pallor  HENT:  Head: Normocephalic and atraumatic.  Mouth/Throat: Oropharynx is clear and moist. No oropharyngeal  exudate.  Eyes: Conjunctivae and EOM are normal. Pupils are equal, round, and reactive to light.  Neck: Normal range of motion. Neck supple.  No meningismus.  Cardiovascular: Normal rate, regular rhythm, normal heart sounds and intact distal pulses.   No murmur heard. Pulmonary/Chest: Effort normal and breath sounds normal. No respiratory distress.  Abdominal: Soft. There is tenderness. There is no rebound and no guarding.  Mild left quadrant abdominal pain  Genitourinary:  Chaperone present, no hemorrhoids, no fissures, no gross blood  Musculoskeletal: Normal range of motion. He exhibits no edema or tenderness.  Neurological: He is alert and oriented to person, place, and time. No cranial nerve deficit. He exhibits normal muscle tone. Coordination normal.  No ataxia on finger to nose bilaterally. No pronator drift. 5/5 strength throughout. CN 2-12 intact.Equal grip strength. Sensation intact.   Skin: Skin is warm.  Psychiatric: He has a normal mood and affect. His behavior is normal.  Nursing note and vitals reviewed.   ED Course  Procedures (including critical care time) Labs Review Labs Reviewed  COMPREHENSIVE METABOLIC PANEL - Abnormal; Notable for the following:    Calcium 8.6 (*)    Total Protein 5.5 (*)    Albumin 3.3 (*)    Alkaline Phosphatase 34 (*)    All other components within normal limits  CBC - Abnormal; Notable for the following:    RBC 5.82 (*)    Hemoglobin 12.5 (*)    MCV 69.2 (*)    MCH 21.5 (*)    RDW 16.6 (*)    All other components within normal limits  POC OCCULT BLOOD, ED - Abnormal; Notable for the following:    Fecal Occult Bld POSITIVE (*)    All other components within normal limits  LIPASE, BLOOD  URINALYSIS, ROUTINE W REFLEX MICROSCOPIC (NOT [facility])  TYPE AND SCREEN  ABO/RH    Imaging Review Ct Abdomen Pelvis W Contrast  05/09/2016  CLINICAL DATA:  Abdominal pain.  Rectal bleeding. EXAM: CT ABDOMEN AND PELVIS WITH CONTRAST TECHNIQUE:  Multidetector CT imaging of the abdomen and pelvis was performed using the standard protocol following bolus administration of intravenous contrast. CONTRAST:  100mL ISOVUE-300 IOPAMIDOL (ISOVUE-300) INJECTION 61% COMPARISON:  None. FINDINGS: Lower chest:  Lung bases clear. Hepatobiliary: The liver normal in size and contour. No liver lesion. Gallbladder and bile ducts normal. Pancreas: Negative Spleen: Negative Adrenals/Urinary Tract: Normal renal size and contour. Symmetric renal excretion of contrast. Normal ureter. Marked prostate enlargement with bladder wall thickening compatible with chronic bladder outlet obstruction. Stomach/Bowel: Negative for bowel obstruction. No bowel edema or mass. Scattered diverticula in the colon. No evidence of diverticulitis. Normal appendix. Vascular/Lymphatic: Normal aorta and IVC. Negative for lymphadenopathy. Reproductive: Marked prostate enlargement measuring 5.5 x 6.3 cm. Mild bladder wall thickening. Other: No free fluid. Soft tissue density in the right inguinal canal. This may represent inguinal hernia versus undescended testis. Recommend evaluation of the scrotum to document descended testes. Musculoskeletal: Negative IMPRESSION: No acute abnormality in the abdomen. Mild colonic diverticulosis without diverticulitis Marked prostate enlargement with bladder  wall thickening consistent with chronic bladder outlet obstruction Soft tissue density right inguinal canal may represent inguinal hernia versus undescended testis. Electronically Signed   By: Marlan Palau M.D.   On: 05/09/2016 16:48   I have personally reviewed and evaluated these images and lab results as part of my medical decision-making.   EKG Interpretation None      MDM   Final diagnoses:  Rectal bleeding   Intermittent abdominal pain, rectal pain with blood in stool. Admits to straining.  No gross hemorrhoids or fissures. Suspect diverticular or hemorrhoidal bleed. Vitals stable. No  anticoagulants.  Hemoglobin 12, no comparison.  FOBT positive.  HR and BP normal.   Diverticulosis on CT. Enlarged prostate. Soft tissue density in the right inguinal canal. 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 No tendnerness  Patient advised not to strain the toilet. Follow up with gastrology and urology and PCP. Return to the ED with worsening symptoms including worsening pain, bleeding and other concerns.  BP 131/91 mmHg  Pulse 56  Temp(Src) 98.8 F (37.1 C) (Oral)  Resp 16  Ht 5\' 11"  (1.803 m)  Wt 217 lb 8 oz (98.657 kg)  BMI 30.35 kg/m2  SpO2 100%   Glynn Octave, MD 05/09/16 1948

## 2016-05-09 NOTE — Discharge Instructions (Signed)
Gastrointestinal Bleeding Follow up with the stomach doctors and urologist. It is possible that you have an undescended testicle. Do not strain on the toilet. Return to the ED if you develop new or worsening symptoms. Gastrointestinal (GI) bleeding means there is bleeding somewhere along the digestive tract, between the mouth and anus. CAUSES  There are many different problems that can cause GI bleeding. Possible causes include:  Esophagitis. This is inflammation, irritation, or swelling of the esophagus.  Hemorrhoids.These are veins that are full of blood (engorged) in the rectum. They cause pain, inflammation, and may bleed.  Anal fissures.These are areas of painful tearing which may bleed. They are often caused by passing hard stool.  Diverticulosis.These are pouches that form on the colon over time, with age, and may bleed significantly.  Diverticulitis.This is inflammation in areas with diverticulosis. It can cause pain, fever, and bloody stools, although bleeding is rare.  Polyps and cancer. Colon cancer often starts out as precancerous polyps.  Gastritis and ulcers.Bleeding from the upper gastrointestinal tract (near the stomach) may travel through the intestines and produce black, sometimes tarry, often bad smelling stools. In certain cases, if the bleeding is fast enough, the stools may not be black, but red. This condition may be life-threatening. SYMPTOMS   Vomiting bright red blood or material that looks like coffee grounds.  Bloody, black, or tarry stools. DIAGNOSIS  Your caregiver may diagnose your condition by taking your history and performing a physical exam. More tests may be needed, including:  X-rays and other imaging tests.  Esophagogastroduodenoscopy (EGD). This test uses a flexible, lighted tube to look at your esophagus, stomach, and small intestine.  Colonoscopy. This test uses a flexible, lighted tube to look at your colon. TREATMENT  Treatment depends  on the cause of your bleeding.   For bleeding from the esophagus, stomach, small intestine, or colon, the caregiver doing your EGD or colonoscopy may be able to stop the bleeding as part of the procedure.  Inflammation or infection of the colon can be treated with medicines.  Many rectal problems can be treated with creams, suppositories, or warm baths.  Surgery is sometimes needed.  Blood transfusions are sometimes needed if you have lost a lot of blood. If bleeding is slow, you may be allowed to go home. If there is a lot of bleeding, you will need to stay in the hospital for observation. HOME CARE INSTRUCTIONS   Take any medicines exactly as prescribed.  Keep your stools soft by eating foods that are high in fiber. These foods include whole grains, legumes, fruits, and vegetables. Prunes (1 to 3 a day) work well for many people.  Drink enough fluids to keep your urine clear or pale yellow. SEEK IMMEDIATE MEDICAL CARE IF:   Your bleeding increases.  You feel lightheaded, weak, or you faint.  You have severe cramps in your back or abdomen.  You pass large blood clots in your stool.  Your problems are getting worse. MAKE SURE YOU:   Understand these instructions.  Will watch your condition.  Will get help right away if you are not doing well or get worse.   This information is not intended to replace advice given to you by your health care provider. Make sure you discuss any questions you have with your health care provider.   Document Released: 12/11/2000 Document Revised: 11/30/2012 Document Reviewed: 06/03/2015 Elsevier Interactive Patient Education [employer]

## 2016-05-09 NOTE — ED Notes (Signed)
Pt reports bright red blood in stool and pain in rectum and abdomen since "a couple months ago." Pt reports that at first the blood and pain was intermittent, now every BM. Pt also reports dizziness.

## 2016-05-15 DIAGNOSIS — D649 Anemia, unspecified: Secondary | ICD-10-CM | POA: Diagnosis not present

## 2016-05-15 DIAGNOSIS — Z125 Encounter for screening for malignant neoplasm of prostate: Secondary | ICD-10-CM | POA: Diagnosis not present

## 2016-05-15 DIAGNOSIS — K5901 Slow transit constipation: Secondary | ICD-10-CM | POA: Diagnosis not present

## 2016-05-15 DIAGNOSIS — Z79899 Other long term (current) drug therapy: Secondary | ICD-10-CM | POA: Diagnosis not present

## 2016-05-15 DIAGNOSIS — K5793 Diverticulitis of intestine, part unspecified, without perforation or abscess with bleeding: Secondary | ICD-10-CM | POA: Diagnosis not present

## 2016-05-15 DIAGNOSIS — D6489 Other specified anemias: Secondary | ICD-10-CM | POA: Diagnosis not present

## 2016-06-02 DIAGNOSIS — Z79899 Other long term (current) drug therapy: Secondary | ICD-10-CM | POA: Diagnosis not present

## 2016-06-02 DIAGNOSIS — Z5181 Encounter for therapeutic drug level monitoring: Secondary | ICD-10-CM | POA: Diagnosis not present

## 2016-06-05 DIAGNOSIS — K5901 Slow transit constipation: Secondary | ICD-10-CM | POA: Diagnosis not present

## 2016-06-29 DIAGNOSIS — R3911 Hesitancy of micturition: Secondary | ICD-10-CM | POA: Diagnosis not present

## 2016-06-29 DIAGNOSIS — N401 Enlarged prostate with lower urinary tract symptoms: Secondary | ICD-10-CM | POA: Diagnosis not present

## 2016-07-01 DIAGNOSIS — Z5181 Encounter for therapeutic drug level monitoring: Secondary | ICD-10-CM | POA: Diagnosis not present

## 2016-07-01 DIAGNOSIS — Z79899 Other long term (current) drug therapy: Secondary | ICD-10-CM | POA: Diagnosis not present

## 2016-08-04 DIAGNOSIS — Z79899 Other long term (current) drug therapy: Secondary | ICD-10-CM | POA: Diagnosis not present

## 2016-08-04 DIAGNOSIS — Z5181 Encounter for therapeutic drug level monitoring: Secondary | ICD-10-CM | POA: Diagnosis not present

## 2016-09-03 DIAGNOSIS — Z5181 Encounter for therapeutic drug level monitoring: Secondary | ICD-10-CM | POA: Diagnosis not present

## 2016-09-03 DIAGNOSIS — Z79899 Other long term (current) drug therapy: Secondary | ICD-10-CM | POA: Diagnosis not present

## 2016-11-14 DIAGNOSIS — Z23 Encounter for immunization: Secondary | ICD-10-CM | POA: Diagnosis not present

## 2017-01-25 ENCOUNTER — Emergency Department (HOSPITAL_COMMUNITY)
Admission: EM | Admit: 2017-01-25 | Discharge: 2017-01-25 | Disposition: A | Discharge status: Home / Self Care | Payer: Medicare Other | Attending: Emergency Medicine | Admitting: Emergency Medicine

## 2017-01-25 ENCOUNTER — Encounter (HOSPITAL_COMMUNITY): Payer: Self-pay | Admitting: *Deleted

## 2017-01-25 DIAGNOSIS — S0501XA Injury of conjunctiva and corneal abrasion without foreign body, right eye, initial encounter: Secondary | ICD-10-CM | POA: Insufficient documentation

## 2017-01-25 DIAGNOSIS — X58XXXA Exposure to other specified factors, initial encounter: Secondary | ICD-10-CM | POA: Diagnosis not present

## 2017-01-25 DIAGNOSIS — Y929 Unspecified place or not applicable: Secondary | ICD-10-CM | POA: Diagnosis not present

## 2017-01-25 DIAGNOSIS — H578 Other specified disorders of eye and adnexa: Secondary | ICD-10-CM | POA: Insufficient documentation

## 2017-01-25 DIAGNOSIS — Y999 Unspecified external cause status: Secondary | ICD-10-CM | POA: Insufficient documentation

## 2017-01-25 DIAGNOSIS — Y939 Activity, unspecified: Secondary | ICD-10-CM | POA: Diagnosis not present

## 2017-01-25 DIAGNOSIS — H5789 Other specified disorders of eye and adnexa: Secondary | ICD-10-CM

## 2017-01-25 DIAGNOSIS — Z79899 Other long term (current) drug therapy: Secondary | ICD-10-CM | POA: Insufficient documentation

## 2017-01-25 MED ORDER — TETRACAINE HCL 0.5 % OP SOLN
2.0000 [drp] | Freq: Once | OPHTHALMIC | Status: AC
Start: 2017-01-25 — End: 2017-01-25
  Administered 2017-01-25: 2 [drp] via OPHTHALMIC
  Filled 2017-01-25: qty 2

## 2017-01-25 MED ORDER — ERYTHROMYCIN 5 MG/GM OP OINT: 1.0000 "application " | TOPICAL_OINTMENT | Freq: Four times a day (QID) | OPHTHALMIC | 0 refills | Status: AC

## 2017-01-25 MED ORDER — FLUORESCEIN SODIUM 0.6 MG OP STRP
1.0000 | ORAL_STRIP | Freq: Once | OPHTHALMIC | Status: AC
  Administered 2017-01-25: 1 via OPHTHALMIC
  Filled 2017-01-25: qty 1

## 2017-01-25 NOTE — ED Notes (Signed)
ED Provider at bedside. 

## 2017-01-25 NOTE — ED Triage Notes (Signed)
Pt states feels like he has a piece of grit stuck in his  R eye since he woke up.

## 2017-01-25 NOTE — Discharge Instructions (Signed)
Try to avoid rubbing eyes. Apply warm or cool compresses and use prescribed eye ointments as directed. Follow up with your ophthalmologist in 1-2 days. Return to the ER for changes or worsening symptoms.

## 2017-01-25 NOTE — ED Provider Notes (Signed)
[facility]-EMERGENCY DEPT Provider Note   CSN: [id number] Arrival date & time: 01/25/17  2952  By signing my name below, I, Majel Homer, attest that this documentation has been prepared under the direction and in the presence of [address], [employer] . Electronically Signed: Majel Homer, Scribe. 01/25/2017. 11:27 AM.  History   Chief Complaint Chief Complaint  Patient presents with  . Eye Pain   The history is provided by the patient and medical records. No language interpreter was used.  Eye Pain  This is a new problem. The current episode started 3 to 5 hours ago. The problem occurs constantly. The problem has not changed since onset.Pertinent negatives include no chest pain, no abdominal pain and no shortness of breath. Exacerbated by: blinking. Nothing relieves the symptoms. He has tried water for the symptoms. The treatment provided no relief.   HPI Comments: EXANDER SHAUL is a 56 y.o. male who presents to the Emergency Department complaining of persistent right eye irritation that began this morning after waking up. Pt describes his pain as 7/10, constant, non-radiating, "gritty sensation" in the R eye which worsens with blinking. Reports associated redness and tearing. He notes "it feels like there is something in my eye, I just don't know what it is." He states he has attempted "flushing" his eye with water without relief and has not taken any medication for his pain. Pt wears glasses but denies contact lens use. He denies visualizing any foreign object in his eye or anything going into his eye that he's aware of. He denies any ocular crusting/drainage, or swelling surrounding his eyes, recent swimming, sick contacts with similar symptoms, fevers, chills, CP, SOB, abd pain, N/V/D/C, hematuria, dysuria, myalgias, arthralgias, numbness, tingling, weakness, or any other complaints at this time.  Past Medical History:  Diagnosis Date  . Depression   . Depression    There are no active  problems to display for this patient.  History reviewed. No pertinent surgical history.  Home Medications    Prior to Admission medications   Medication Sig Start Date End Date Taking? Authorizing Provider  acetaminophen (TYLENOL) 500 MG tablet Take 1,000 mg by mouth every 6 (six) hours as needed for moderate pain.    Historical Provider, MD  citalopram (CELEXA) 20 MG tablet Take 20 mg by mouth daily.    Historical Provider, MD  diclofenac (VOLTAREN) 75 MG EC tablet Take 1 tablet (75 mg total) by mouth 2 (two) times daily. 10/19/14   Ozella Rocks, MD  diclofenac sodium (VOLTAREN) 1 % GEL Apply 2-4 g topically 4 (four) times daily. 10/19/14   Ozella Rocks, MD  docusate sodium (COLACE) 100 MG capsule Take 1 capsule (100 mg total) by mouth every 12 (twelve) hours. 05/09/16   Glynn Octave, MD  hydrOXYzine (VISTARIL) 25 MG capsule Take 25 mg by mouth daily.    Historical Provider, MD  naproxen (NAPROSYN) 500 MG tablet Take 1 tablet (500 mg total) by mouth 3 (three) times daily with meals. 08/13/15   Earley Favor, NP  predniSONE (DELTASONE) 10 MG tablet Take 2 tablets (20 mg total) by mouth daily. 08/13/15   Earley Favor, NP    Family History No family history on file.  Social History Social History  Substance Use Topics  . Smoking status: Never Smoker  . Smokeless tobacco: Never Used  . Alcohol use No   Allergies   Patient has no known allergies.  Review of Systems Review of Systems  Constitutional: Negative for chills and  fever.  Eyes: Positive for pain and redness. Negative for photophobia, discharge and visual disturbance.  Respiratory: Negative for shortness of breath.   Cardiovascular: Negative for chest pain.  Gastrointestinal: Negative for abdominal pain, constipation, diarrhea, nausea and vomiting.  Genitourinary: Negative for dysuria and hematuria.  Musculoskeletal: Negative for arthralgias and myalgias.  Skin: Negative for color change.  Allergic/Immunologic:  Negative for immunocompromised state.  Neurological: Negative for weakness and numbness.  Psychiatric/Behavioral: Negative for confusion.  10 systems reviewed and all are negative for acute change except as noted in the HPI.  Physical Exam Updated Vital Signs BP 127/87 (BP Location: Right Arm)   Pulse 65   Temp 98.3 F (36.8 C) (Oral)   Resp 16   Ht 5\' 11"  (1.803 m)   Wt 192 lb (87.1 kg)   SpO2 99%   BMI 26.78 kg/m   Physical Exam  Constitutional: He is oriented to person, place, and time. Vital signs are normal. He appears well-developed and well-nourished.  Non-toxic appearance. No distress.  Afebrile, nontoxic, NAD  HENT:  Head: Normocephalic and atraumatic.  Mouth/Throat: Mucous membranes are normal.  Eyes: EOM are normal. Pupils are equal, round, and reactive to light. Lids are everted and swept, no foreign bodies found. Right eye exhibits no discharge. No foreign body present in the right eye. Left eye exhibits no discharge. No foreign body present in the left eye. Right conjunctiva is injected. Right conjunctiva has no hemorrhage. Left conjunctiva is not injected. Left conjunctiva has no hemorrhage.  Slit lamp exam:      The right eye shows corneal abrasion and fluorescein uptake. The right eye shows no corneal ulcer and no foreign body.       The left eye shows no foreign body.  PERRL, EOMI, no nystagmus, no visual field deficits Right conjunctiva mildly injected, no hemorrhage, lids everted and swept with no FBs noted, no obvious FB noted in either eye. L eye clear. No consensual eye pain or photophobia. No occular drainage or crusting. Fluorescein stain showing small corneal abrasion to R cornea, mild uptake in the area, no ulceration, no FBs noted during fluorescein stain.  Visual Acuity     Right Eye Near: R Near: 20/40 Left Eye Near:  L Near: 20/30  Neck: Normal range of motion. Neck supple.  Cardiovascular: Normal rate and intact distal pulses.   Pulmonary/Chest:  Effort normal. No respiratory distress.  Abdominal: Normal appearance. He exhibits no distension.  Musculoskeletal: Normal range of motion.  Neurological: He is alert and oriented to person, place, and time. He has normal strength. No sensory deficit.  Skin: Skin is warm, dry and intact. No rash noted.  Psychiatric: He has a normal mood and affect.  Nursing note and vitals reviewed.  ED Treatments / Results  Labs (all labs ordered are listed, but only abnormal results are displayed) Labs Reviewed - No data to display  EKG  EKG Interpretation None       Radiology No results found.  Procedures Procedures (including critical care time)  Medications Ordered in ED Medications  tetracaine (PONTOCAINE) 0.5 % ophthalmic solution 2 drop (2 drops Right Eye Given 01/25/17 1105)  fluorescein ophthalmic strip 1 strip (1 strip Right Eye Given 01/25/17 1105)    DIAGNOSTIC STUDIES:  Oxygen Saturation is 99% on RA, normal by my interpretation.    COORDINATION OF CARE:  10:57 AM Discussed treatment plan with pt at bedside and pt agreed to plan.  Initial Impression / Assessment and Plan / ED  Course  I have reviewed the triage vital signs and the nursing notes.  Pertinent labs & imaging results that were available during my care of the patient were reviewed by me and considered in my medical decision making (see chart for details).     56 y.o. male here with R eye irritation like a FB sensation, woke up with it this morning; no known FBs to eye, no crusting or drainage. Mildly injected conjunctiva. Visual acuity preserved, no photophobia, PERRL, fluorescein stain shows very small corneal abrasion to R cornea, likely cause of symptoms. No FBs noted, lids everted and swept. Eye flushed with saline. Will send home with erythromycin ointment, advised f/up with ophthalmologist in 1-2 days for recheck. I explained the diagnosis and have given explicit precautions to return to the ER including for  any other new or worsening symptoms. The patient understands and accepts the medical plan as it's been dictated and I have answered their questions. Discharge instructions concerning home care and prescriptions have been given. The patient is STABLE and is discharged to home in good condition.   I personally performed the services described in this documentation, which was scribed in my presence. The recorded information has been reviewed and is accurate.   Final Clinical Impressions(s) / ED Diagnoses   Final diagnoses:  Abrasion of right cornea, initial encounter  Eye irritation    New Prescriptions New Prescriptions   ERYTHROMYCIN OPHTHALMIC OINTMENT    Place 1 application into the right eye 4 (four) times daily. Apply thin 1/2 inch ribbon to lower eyelid every 6 hours x 7 days     [id number] Argyle CourtMercedes Strupp Latrica Clowers, PA-C 01/25/17 1150    Cathren LaineKevin Steinl, MD 01/25/17 1520

## 2017-01-28 DIAGNOSIS — Z5181 Encounter for therapeutic drug level monitoring: Secondary | ICD-10-CM | POA: Diagnosis not present

## 2017-01-28 DIAGNOSIS — Z79899 Other long term (current) drug therapy: Secondary | ICD-10-CM | POA: Diagnosis not present

## 2017-06-21 DIAGNOSIS — R3912 Poor urinary stream: Secondary | ICD-10-CM | POA: Diagnosis not present

## 2017-06-21 DIAGNOSIS — N401 Enlarged prostate with lower urinary tract symptoms: Secondary | ICD-10-CM | POA: Diagnosis not present

## 2017-06-21 DIAGNOSIS — R3911 Hesitancy of micturition: Secondary | ICD-10-CM | POA: Diagnosis not present

## 2018-04-25 DIAGNOSIS — N401 Enlarged prostate with lower urinary tract symptoms: Secondary | ICD-10-CM | POA: Diagnosis not present

## 2018-04-25 DIAGNOSIS — R3911 Hesitancy of micturition: Secondary | ICD-10-CM | POA: Diagnosis not present

## 2018-06-30 ENCOUNTER — Emergency Department (HOSPITAL_COMMUNITY): Payer: Medicare Other

## 2018-06-30 ENCOUNTER — Inpatient Hospital Stay (HOSPITAL_COMMUNITY)
Admission: EM | Admit: 2018-06-30 | Discharge: 2018-08-13 | DRG: 917 | Disposition: A | Discharge status: Home / Self Care | Payer: Medicare Other | Attending: Internal Medicine | Admitting: Internal Medicine

## 2018-06-30 ENCOUNTER — Inpatient Hospital Stay (HOSPITAL_COMMUNITY): Payer: Medicare Other

## 2018-06-30 ENCOUNTER — Encounter (HOSPITAL_COMMUNITY): Payer: Self-pay

## 2018-06-30 DIAGNOSIS — Z111 Encounter for screening for respiratory tuberculosis: Secondary | ICD-10-CM

## 2018-06-30 DIAGNOSIS — I427 Cardiomyopathy due to drug and external agent: Secondary | ICD-10-CM | POA: Diagnosis not present

## 2018-06-30 DIAGNOSIS — N400 Enlarged prostate without lower urinary tract symptoms: Secondary | ICD-10-CM | POA: Diagnosis present

## 2018-06-30 DIAGNOSIS — I5023 Acute on chronic systolic (congestive) heart failure: Secondary | ICD-10-CM | POA: Diagnosis not present

## 2018-06-30 DIAGNOSIS — G253 Myoclonus: Secondary | ICD-10-CM | POA: Diagnosis not present

## 2018-06-30 DIAGNOSIS — I5030 Unspecified diastolic (congestive) heart failure: Secondary | ICD-10-CM | POA: Diagnosis not present

## 2018-06-30 DIAGNOSIS — Z4659 Encounter for fitting and adjustment of other gastrointestinal appliance and device: Secondary | ICD-10-CM

## 2018-06-30 DIAGNOSIS — D638 Anemia in other chronic diseases classified elsewhere: Secondary | ICD-10-CM | POA: Diagnosis present

## 2018-06-30 DIAGNOSIS — E870 Hyperosmolality and hypernatremia: Secondary | ICD-10-CM | POA: Diagnosis not present

## 2018-06-30 DIAGNOSIS — R319 Hematuria, unspecified: Secondary | ICD-10-CM | POA: Diagnosis not present

## 2018-06-30 DIAGNOSIS — I959 Hypotension, unspecified: Secondary | ICD-10-CM | POA: Diagnosis not present

## 2018-06-30 DIAGNOSIS — Z4682 Encounter for fitting and adjustment of non-vascular catheter: Secondary | ICD-10-CM | POA: Diagnosis not present

## 2018-06-30 DIAGNOSIS — E875 Hyperkalemia: Secondary | ICD-10-CM | POA: Diagnosis not present

## 2018-06-30 DIAGNOSIS — R011 Cardiac murmur, unspecified: Secondary | ICD-10-CM | POA: Diagnosis present

## 2018-06-30 DIAGNOSIS — N179 Acute kidney failure, unspecified: Secondary | ICD-10-CM | POA: Diagnosis present

## 2018-06-30 DIAGNOSIS — G255 Other chorea: Secondary | ICD-10-CM | POA: Diagnosis not present

## 2018-06-30 DIAGNOSIS — Z79899 Other long term (current) drug therapy: Secondary | ICD-10-CM

## 2018-06-30 DIAGNOSIS — F14988 Cocaine use, unspecified with other cocaine-induced disorder: Secondary | ICD-10-CM | POA: Diagnosis present

## 2018-06-30 DIAGNOSIS — R1111 Vomiting without nausea: Secondary | ICD-10-CM | POA: Diagnosis not present

## 2018-06-30 DIAGNOSIS — K7200 Acute and subacute hepatic failure without coma: Secondary | ICD-10-CM | POA: Diagnosis not present

## 2018-06-30 DIAGNOSIS — R5381 Other malaise: Secondary | ICD-10-CM | POA: Diagnosis not present

## 2018-06-30 DIAGNOSIS — Y92230 Patient room in hospital as the place of occurrence of the external cause: Secondary | ICD-10-CM | POA: Diagnosis not present

## 2018-06-30 DIAGNOSIS — I110 Hypertensive heart disease with heart failure: Secondary | ICD-10-CM | POA: Diagnosis present

## 2018-06-30 DIAGNOSIS — E872 Acidosis, unspecified: Secondary | ICD-10-CM

## 2018-06-30 DIAGNOSIS — I5021 Acute systolic (congestive) heart failure: Secondary | ICD-10-CM | POA: Diagnosis present

## 2018-06-30 DIAGNOSIS — J690 Pneumonitis due to inhalation of food and vomit: Secondary | ICD-10-CM | POA: Diagnosis not present

## 2018-06-30 DIAGNOSIS — R570 Cardiogenic shock: Secondary | ICD-10-CM | POA: Diagnosis present

## 2018-06-30 DIAGNOSIS — Y738 Miscellaneous gastroenterology and urology devices associated with adverse incidents, not elsewhere classified: Secondary | ICD-10-CM | POA: Diagnosis not present

## 2018-06-30 DIAGNOSIS — F1290 Cannabis use, unspecified, uncomplicated: Secondary | ICD-10-CM | POA: Diagnosis present

## 2018-06-30 DIAGNOSIS — G931 Anoxic brain damage, not elsewhere classified: Secondary | ICD-10-CM | POA: Diagnosis not present

## 2018-06-30 DIAGNOSIS — J9690 Respiratory failure, unspecified, unspecified whether with hypoxia or hypercapnia: Secondary | ICD-10-CM

## 2018-06-30 DIAGNOSIS — I469 Cardiac arrest, cause unspecified: Secondary | ICD-10-CM | POA: Diagnosis present

## 2018-06-30 DIAGNOSIS — T405X1A Poisoning by cocaine, accidental (unintentional), initial encounter: Principal | ICD-10-CM | POA: Diagnosis present

## 2018-06-30 DIAGNOSIS — G9349 Other encephalopathy: Secondary | ICD-10-CM | POA: Diagnosis not present

## 2018-06-30 DIAGNOSIS — J9601 Acute respiratory failure with hypoxia: Secondary | ICD-10-CM | POA: Diagnosis not present

## 2018-06-30 DIAGNOSIS — Z8674 Personal history of sudden cardiac arrest: Secondary | ICD-10-CM

## 2018-06-30 DIAGNOSIS — R0902 Hypoxemia: Secondary | ICD-10-CM | POA: Diagnosis not present

## 2018-06-30 DIAGNOSIS — R112 Nausea with vomiting, unspecified: Secondary | ICD-10-CM | POA: Diagnosis not present

## 2018-06-30 DIAGNOSIS — T8383XA Hemorrhage of genitourinary prosthetic devices, implants and grafts, initial encounter: Secondary | ICD-10-CM | POA: Diagnosis not present

## 2018-06-30 DIAGNOSIS — I4510 Unspecified right bundle-branch block: Secondary | ICD-10-CM | POA: Diagnosis not present

## 2018-06-30 DIAGNOSIS — R079 Chest pain, unspecified: Secondary | ICD-10-CM | POA: Diagnosis not present

## 2018-06-30 DIAGNOSIS — K921 Melena: Secondary | ICD-10-CM | POA: Diagnosis not present

## 2018-06-30 DIAGNOSIS — R509 Fever, unspecified: Secondary | ICD-10-CM | POA: Diagnosis not present

## 2018-06-30 DIAGNOSIS — Z452 Encounter for adjustment and management of vascular access device: Secondary | ICD-10-CM | POA: Diagnosis not present

## 2018-06-30 DIAGNOSIS — R9431 Abnormal electrocardiogram [ECG] [EKG]: Secondary | ICD-10-CM | POA: Diagnosis not present

## 2018-06-30 DIAGNOSIS — J984 Other disorders of lung: Secondary | ICD-10-CM | POA: Diagnosis not present

## 2018-06-30 DIAGNOSIS — I499 Cardiac arrhythmia, unspecified: Secondary | ICD-10-CM | POA: Diagnosis not present

## 2018-06-30 DIAGNOSIS — R110 Nausea: Secondary | ICD-10-CM | POA: Diagnosis not present

## 2018-06-30 DIAGNOSIS — G9389 Other specified disorders of brain: Secondary | ICD-10-CM | POA: Diagnosis not present

## 2018-06-30 DIAGNOSIS — T17890A Other foreign object in other parts of respiratory tract causing asphyxiation, initial encounter: Secondary | ICD-10-CM | POA: Diagnosis not present

## 2018-06-30 DIAGNOSIS — M6282 Rhabdomyolysis: Secondary | ICD-10-CM | POA: Diagnosis not present

## 2018-06-30 DIAGNOSIS — D500 Iron deficiency anemia secondary to blood loss (chronic): Secondary | ICD-10-CM | POA: Diagnosis not present

## 2018-06-30 DIAGNOSIS — D509 Iron deficiency anemia, unspecified: Secondary | ICD-10-CM | POA: Diagnosis present

## 2018-06-30 DIAGNOSIS — I248 Other forms of acute ischemic heart disease: Secondary | ICD-10-CM | POA: Diagnosis not present

## 2018-06-30 DIAGNOSIS — I4581 Long QT syndrome: Secondary | ICD-10-CM | POA: Diagnosis present

## 2018-06-30 DIAGNOSIS — X58XXXA Exposure to other specified factors, initial encounter: Secondary | ICD-10-CM | POA: Diagnosis not present

## 2018-06-30 DIAGNOSIS — I509 Heart failure, unspecified: Secondary | ICD-10-CM | POA: Diagnosis not present

## 2018-06-30 DIAGNOSIS — Z818 Family history of other mental and behavioral disorders: Secondary | ICD-10-CM

## 2018-06-30 DIAGNOSIS — Z9289 Personal history of other medical treatment: Secondary | ICD-10-CM

## 2018-06-30 DIAGNOSIS — R310 Gross hematuria: Secondary | ICD-10-CM | POA: Diagnosis not present

## 2018-06-30 DIAGNOSIS — R109 Unspecified abdominal pain: Secondary | ICD-10-CM | POA: Diagnosis not present

## 2018-06-30 DIAGNOSIS — F329 Major depressive disorder, single episode, unspecified: Secondary | ICD-10-CM | POA: Diagnosis present

## 2018-06-30 DIAGNOSIS — R0989 Other specified symptoms and signs involving the circulatory and respiratory systems: Secondary | ICD-10-CM | POA: Diagnosis not present

## 2018-06-30 DIAGNOSIS — K922 Gastrointestinal hemorrhage, unspecified: Secondary | ICD-10-CM | POA: Diagnosis not present

## 2018-06-30 DIAGNOSIS — R918 Other nonspecific abnormal finding of lung field: Secondary | ICD-10-CM | POA: Diagnosis not present

## 2018-06-30 DIAGNOSIS — E162 Hypoglycemia, unspecified: Secondary | ICD-10-CM | POA: Diagnosis not present

## 2018-06-30 DIAGNOSIS — R0789 Other chest pain: Secondary | ICD-10-CM | POA: Diagnosis not present

## 2018-06-30 DIAGNOSIS — R609 Edema, unspecified: Secondary | ICD-10-CM | POA: Diagnosis not present

## 2018-06-30 DIAGNOSIS — G9340 Encephalopathy, unspecified: Secondary | ICD-10-CM | POA: Diagnosis not present

## 2018-06-30 DIAGNOSIS — R0602 Shortness of breath: Secondary | ICD-10-CM | POA: Diagnosis not present

## 2018-06-30 DIAGNOSIS — I10 Essential (primary) hypertension: Secondary | ICD-10-CM | POA: Diagnosis not present

## 2018-06-30 HISTORY — DX: Other psychoactive substance abuse, uncomplicated: F19.10

## 2018-06-30 HISTORY — DX: Benign prostatic hyperplasia without lower urinary tract symptoms: N40.0

## 2018-06-30 LAB — CBC WITH DIFFERENTIAL/PLATELET
BASOS PCT: 1 %
Basophils Absolute: 0.1 10*3/uL (ref 0.0–0.1)
EOS PCT: 0 %
Eosinophils Absolute: 0 10*3/uL (ref 0.0–0.7)
HEMATOCRIT: 50.5 % (ref 39.0–52.0)
HEMOGLOBIN: 13.8 g/dL (ref 13.0–17.0)
LYMPHS PCT: 11 %
Lymphs Abs: 1.3 10*3/uL (ref 0.7–4.0)
MCH: 22.2 pg — AB (ref 26.0–34.0)
MCHC: 27.3 g/dL — ABNORMAL LOW (ref 30.0–36.0)
MCV: 81.3 fL (ref 78.0–100.0)
Monocytes Absolute: 0.5 10*3/uL (ref 0.1–1.0)
Monocytes Relative: 4 %
NEUTROS ABS: 9.7 10*3/uL — AB (ref 1.7–7.7)
Neutrophils Relative %: 84 %
Platelets: 160 10*3/uL (ref 150–400)
RBC: 6.21 MIL/uL — ABNORMAL HIGH (ref 4.22–5.81)
RDW: 18.1 % — ABNORMAL HIGH (ref 11.5–15.5)
WBC MORPHOLOGY: INCREASED
WBC: 11.6 10*3/uL — ABNORMAL HIGH (ref 4.0–10.5)

## 2018-06-30 LAB — I-STAT ARTERIAL BLOOD GAS, ED
Acid-base deficit: 19 mmol/L — ABNORMAL HIGH (ref 0.0–2.0)
Bicarbonate: 12.9 mmol/L — ABNORMAL LOW (ref 20.0–28.0)
O2 Saturation: 99 %
PCO2 ART: 46.1 mmHg (ref 32.0–48.0)
PH ART: 7.027 — AB (ref 7.350–7.450)
Patient temperature: 33.2
TCO2: 15 mmol/L — ABNORMAL LOW (ref 22–32)
pO2, Arterial: 160 mmHg — ABNORMAL HIGH (ref 83.0–108.0)

## 2018-06-30 LAB — I-STAT TROPONIN, ED: TROPONIN I, POC: 0.42 ng/mL — AB (ref 0.00–0.08)

## 2018-06-30 LAB — BASIC METABOLIC PANEL
Anion gap: 18 — ABNORMAL HIGH (ref 5–15)
BUN: 27 mg/dL — AB (ref 6–20)
CALCIUM: 6.8 mg/dL — AB (ref 8.9–10.3)
CHLORIDE: 107 mmol/L (ref 98–111)
CO2: 16 mmol/L — ABNORMAL LOW (ref 22–32)
CREATININE: 4 mg/dL — AB (ref 0.61–1.24)
GFR, EST AFRICAN AMERICAN: 18 mL/min — AB (ref >60)
GFR, EST NON AFRICAN AMERICAN: 15 mL/min — AB (ref >60)
Glucose, Bld: 101 mg/dL — ABNORMAL HIGH (ref 70–99)
Potassium: 6.4 mmol/L (ref 3.5–5.1)
SODIUM: 141 mmol/L (ref 135–145)

## 2018-06-30 LAB — POCT I-STAT, CHEM 8
BUN: 35 mg/dL — AB (ref 6–20)
CREATININE: 3.8 mg/dL — AB (ref 0.61–1.24)
Calcium, Ion: 1 mmol/L — ABNORMAL LOW (ref 1.15–1.40)
Chloride: 105 mmol/L (ref 98–111)
GLUCOSE: 97 mg/dL (ref 70–99)
HEMATOCRIT: 43 % (ref 39.0–52.0)
Hemoglobin: 14.6 g/dL (ref 13.0–17.0)
POTASSIUM: 6.2 mmol/L — AB (ref 3.5–5.1)
Sodium: 137 mmol/L (ref 135–145)
TCO2: 18 mmol/L — ABNORMAL LOW (ref 22–32)

## 2018-06-30 LAB — I-STAT CG4 LACTIC ACID, ED: LACTIC ACID, VENOUS: 12.79 mmol/L — AB (ref 0.5–1.9)

## 2018-06-30 LAB — RAPID URINE DRUG SCREEN, HOSP PERFORMED
AMPHETAMINES: NOT DETECTED
Benzodiazepines: NOT DETECTED
Cocaine: POSITIVE — AB
Opiates: NOT DETECTED
TETRAHYDROCANNABINOL: POSITIVE — AB

## 2018-06-30 LAB — I-STAT CHEM 8, ED
BUN: 38 mg/dL — ABNORMAL HIGH (ref 6–20)
Calcium, Ion: 0.99 mmol/L — ABNORMAL LOW (ref 1.15–1.40)
Chloride: 100 mmol/L (ref 98–111)
Creatinine, Ser: 3.6 mg/dL — ABNORMAL HIGH (ref 0.61–1.24)
GLUCOSE: 134 mg/dL — AB (ref 70–99)
HCT: 48 % (ref 39.0–52.0)
HEMOGLOBIN: 16.3 g/dL (ref 13.0–17.0)
POTASSIUM: 4.3 mmol/L (ref 3.5–5.1)
SODIUM: 136 mmol/L (ref 135–145)
TCO2: 18 mmol/L — ABNORMAL LOW (ref 22–32)

## 2018-06-30 LAB — POCT I-STAT 3, VENOUS BLOOD GAS (G3P V)
ACID-BASE EXCESS: 1 mmol/L (ref 0.0–2.0)
BICARBONATE: 30 mmol/L — AB (ref 20.0–28.0)
O2 Saturation: 35 %
TCO2: 32 mmol/L (ref 22–32)
pCO2, Ven: 64.6 mmHg — ABNORMAL HIGH (ref 44.0–60.0)
pH, Ven: 7.274 (ref 7.250–7.430)
pO2, Ven: 24 mmHg — CL (ref 32.0–45.0)

## 2018-06-30 LAB — POCT I-STAT 3, ART BLOOD GAS (G3+)
ACID-BASE DEFICIT: 4 mmol/L — AB (ref 0.0–2.0)
Acid-base deficit: 12 mmol/L — ABNORMAL HIGH (ref 0.0–2.0)
BICARBONATE: 17.1 mmol/L — AB (ref 20.0–28.0)
Bicarbonate: 23.2 mmol/L (ref 20.0–28.0)
O2 Saturation: 92 %
O2 Saturation: 92 %
PCO2 ART: 51.3 mmHg — AB (ref 32.0–48.0)
PO2 ART: 76 mmHg — AB (ref 83.0–108.0)
Patient temperature: 36.8
TCO2: 19 mmol/L — AB (ref 22–32)
TCO2: 25 mmol/L (ref 22–32)
pCO2 arterial: 47.2 mmHg (ref 32.0–48.0)
pH, Arterial: 7.161 — CL (ref 7.350–7.450)
pH, Arterial: 7.263 — ABNORMAL LOW (ref 7.350–7.450)
pO2, Arterial: 74 mmHg — ABNORMAL LOW (ref 83.0–108.0)

## 2018-06-30 LAB — COMPREHENSIVE METABOLIC PANEL
ALBUMIN: 2.9 g/dL — AB (ref 3.5–5.0)
ALT: 3795 U/L — ABNORMAL HIGH (ref 0–44)
ANION GAP: 24 — AB (ref 5–15)
AST: 2690 U/L — ABNORMAL HIGH (ref 15–41)
Alkaline Phosphatase: 70 U/L (ref 38–126)
BUN: 25 mg/dL — AB (ref 6–20)
CO2: 17 mmol/L — AB (ref 22–32)
Calcium: 8 mg/dL — ABNORMAL LOW (ref 8.9–10.3)
Chloride: 99 mmol/L (ref 98–111)
Creatinine, Ser: 4.14 mg/dL — ABNORMAL HIGH (ref 0.61–1.24)
GFR calc Af Amer: 17 mL/min — ABNORMAL LOW (ref >60)
GFR calc non Af Amer: 15 mL/min — ABNORMAL LOW (ref >60)
GLUCOSE: 148 mg/dL — AB (ref 70–99)
Potassium: 4.5 mmol/L (ref 3.5–5.1)
SODIUM: 140 mmol/L (ref 135–145)
Total Bilirubin: 0.8 mg/dL (ref 0.3–1.2)
Total Protein: 4.9 g/dL — ABNORMAL LOW (ref 6.5–8.1)

## 2018-06-30 LAB — GLUCOSE, CAPILLARY
GLUCOSE-CAPILLARY: 114 mg/dL — AB (ref 70–99)
GLUCOSE-CAPILLARY: 115 mg/dL — AB (ref 70–99)

## 2018-06-30 LAB — URINALYSIS, ROUTINE W REFLEX MICROSCOPIC
Bilirubin Urine: NEGATIVE
GLUCOSE, UA: 50 mg/dL — AB
Hgb urine dipstick: NEGATIVE
Ketones, ur: 5 mg/dL — AB
Leukocytes, UA: NEGATIVE
Nitrite: NEGATIVE
PH: 5 (ref 5.0–8.0)
Protein, ur: 100 mg/dL — AB
SPECIFIC GRAVITY, URINE: 1.026 (ref 1.005–1.030)

## 2018-06-30 LAB — LACTIC ACID, PLASMA
LACTIC ACID, VENOUS: 5.1 mmol/L — AB (ref 0.5–1.9)
LACTIC ACID, VENOUS: 3.8 mmol/L — AB (ref 0.5–1.9)

## 2018-06-30 LAB — APTT: aPTT: 43 seconds — ABNORMAL HIGH (ref 24–36)

## 2018-06-30 LAB — PROCALCITONIN: Procalcitonin: 35.56 ng/mL

## 2018-06-30 LAB — MRSA PCR SCREENING: MRSA by PCR: NEGATIVE

## 2018-06-30 LAB — CK
CK TOTAL: 1430 U/L — AB (ref 49–397)
CK TOTAL: 5391 U/L — AB (ref 49–397)

## 2018-06-30 LAB — PROTIME-INR
INR: 1.88
PROTHROMBIN TIME: 21.5 s — AB (ref 11.4–15.2)

## 2018-06-30 LAB — TROPONIN I: Troponin I: 0.64 ng/mL (ref <0.03)

## 2018-06-30 MED ORDER — SODIUM CHLORIDE 0.9 % IV SOLN
250.0000 mL | INTRAVENOUS | Status: DC | PRN
  Administered 2018-06-30: 10 mL via INTRAVENOUS

## 2018-06-30 MED ORDER — FENTANYL CITRATE (PF) 100 MCG/2ML IJ SOLN
INTRAMUSCULAR | Status: AC
  Administered 2018-06-30: 100 ug via INTRAVENOUS
  Filled 2018-06-30: qty 2

## 2018-06-30 MED ORDER — VASOPRESSIN 20 UNIT/ML IV SOLN
0.0300 [IU]/min | INTRAVENOUS | Status: DC
  Administered 2018-06-30 – 2018-07-01 (×2): 0.03 [IU]/min via INTRAVENOUS
  Filled 2018-06-30 (×2): qty 2

## 2018-06-30 MED ORDER — NOREPINEPHRINE 4 MG/250ML-% IV SOLN
0.0000 ug/min | Freq: Once | INTRAVENOUS | Status: AC
  Administered 2018-06-30: 40 ug/min via INTRAVENOUS
  Filled 2018-06-30 (×2): qty 250

## 2018-06-30 MED ORDER — PROPOFOL 1000 MG/100ML IV EMUL: 25.0000 ug/kg/min | INTRAVENOUS | Status: DC

## 2018-06-30 MED ORDER — SODIUM CHLORIDE 0.9 % IV BOLUS
1000.0000 mL | Freq: Once | INTRAVENOUS | Status: AC
  Administered 2018-06-30: 1000 mL via INTRAVENOUS

## 2018-06-30 MED ORDER — MIDAZOLAM HCL 2 MG/2ML IJ SOLN
INTRAMUSCULAR | Status: AC
  Administered 2018-06-30: 2 mg
  Filled 2018-06-30: qty 2

## 2018-06-30 MED ORDER — SODIUM CHLORIDE 0.9 % IV SOLN
3.0000 ug/kg/min | INTRAVENOUS | Status: DC
  Administered 2018-06-30 – 2018-07-01 (×2): 3 ug/kg/min via INTRAVENOUS
  Filled 2018-06-30 (×2): qty 20

## 2018-06-30 MED ORDER — PANTOPRAZOLE SODIUM 40 MG PO PACK
40.0000 mg | PACK | Freq: Two times a day (BID) | ORAL | Status: DC
  Administered 2018-06-30 – 2018-07-01 (×3): 40 mg
  Filled 2018-06-30 (×3): qty 20

## 2018-06-30 MED ORDER — ORAL CARE MOUTH RINSE
15.0000 mL | OROMUCOSAL | Status: DC
  Administered 2018-07-01 – 2018-07-06 (×57): 15 mL via OROMUCOSAL

## 2018-06-30 MED ORDER — CHLORHEXIDINE GLUCONATE 0.12% ORAL RINSE (MEDLINE KIT)
15.0000 mL | Freq: Two times a day (BID) | OROMUCOSAL | Status: DC
  Administered 2018-06-30 – 2018-07-06 (×12): 15 mL via OROMUCOSAL

## 2018-06-30 MED ORDER — SODIUM POLYSTYRENE SULFONATE 15 GM/60ML PO SUSP
60.0000 g | Freq: Once | ORAL | Status: AC
  Administered 2018-06-30: 60 g
  Filled 2018-06-30: qty 240

## 2018-06-30 MED ORDER — FENTANYL CITRATE (PF) 100 MCG/2ML IJ SOLN: 100.0000 ug | Freq: Once | INTRAMUSCULAR | Status: DC

## 2018-06-30 MED ORDER — FENTANYL CITRATE (PF) 100 MCG/2ML IJ SOLN: 50.0000 ug | Freq: Once | INTRAMUSCULAR | Status: DC

## 2018-06-30 MED ORDER — PROPOFOL 1000 MG/100ML IV EMUL
5.0000 ug/kg/min | INTRAVENOUS | Status: DC
  Administered 2018-06-30: 5 ug/kg/min via INTRAVENOUS

## 2018-06-30 MED ORDER — FENTANYL 2500MCG IN NS 250ML (10MCG/ML) PREMIX INFUSION: 0.0000 ug/h | INTRAVENOUS | Status: DC

## 2018-06-30 MED ORDER — PIPERACILLIN-TAZOBACTAM 3.375 G IVPB 30 MIN
3.3750 g | Freq: Once | INTRAVENOUS | Status: AC
  Administered 2018-06-30: 3.375 g via INTRAVENOUS
  Filled 2018-06-30: qty 50

## 2018-06-30 MED ORDER — FENTANYL 2500MCG IN NS 250ML (10MCG/ML) PREMIX INFUSION: 100.0000 ug/h | INTRAVENOUS | Status: DC

## 2018-06-30 MED ORDER — MIDAZOLAM HCL 5 MG/ML IJ SOLN: 2.0000 mg | Freq: Once | INTRAMUSCULAR | Status: DC

## 2018-06-30 MED ORDER — PANTOPRAZOLE SODIUM 40 MG IV SOLR: 40.0000 mg | Freq: Two times a day (BID) | INTRAVENOUS | Status: DC

## 2018-06-30 MED ORDER — FENTANYL CITRATE (PF) 100 MCG/2ML IJ SOLN: 100.0000 ug | Freq: Once | INTRAMUSCULAR | Status: DC | PRN

## 2018-06-30 MED ORDER — HEPARIN SODIUM (PORCINE) 5000 UNIT/ML IJ SOLN
5000.0000 [IU] | Freq: Three times a day (TID) | INTRAMUSCULAR | Status: DC
  Administered 2018-06-30 – 2018-07-02 (×5): 5000 [IU] via SUBCUTANEOUS
  Filled 2018-06-30 (×5): qty 1

## 2018-06-30 MED ORDER — FENTANYL CITRATE (PF) 100 MCG/2ML IJ SOLN
100.0000 ug | Freq: Once | INTRAMUSCULAR | Status: AC
  Administered 2018-06-30: 100 ug via INTRAVENOUS
  Filled 2018-06-30: qty 2

## 2018-06-30 MED ORDER — FENTANYL BOLUS VIA INFUSION: 50.0000 ug | INTRAVENOUS | Status: DC | PRN

## 2018-06-30 MED ORDER — SODIUM BICARBONATE 8.4 % IV SOLN
50.0000 meq | Freq: Once | INTRAVENOUS | Status: AC
  Administered 2018-06-30: 50 meq via INTRAVENOUS

## 2018-06-30 MED ORDER — PROPOFOL 1000 MG/100ML IV EMUL
0.0000 ug/kg/min | INTRAVENOUS | Status: DC
  Administered 2018-06-30 (×2): 80 ug/kg/min via INTRAVENOUS
  Administered 2018-07-01: 20 ug/kg/min via INTRAVENOUS
  Administered 2018-07-01: 40 ug/kg/min via INTRAVENOUS
  Administered 2018-07-01 (×3): 70 ug/kg/min via INTRAVENOUS
  Filled 2018-06-30 (×9): qty 100

## 2018-06-30 MED ORDER — NOREPINEPHRINE 4 MG/250ML-% IV SOLN
0.0000 ug/min | INTRAVENOUS | Status: DC
  Administered 2018-06-30: 45 ug/min via INTRAVENOUS
  Filled 2018-06-30: qty 250

## 2018-06-30 MED ORDER — FENTANYL CITRATE (PF) 100 MCG/2ML IJ SOLN
100.0000 ug | Freq: Once | INTRAMUSCULAR | Status: AC
  Administered 2018-06-30: 100 ug via INTRAVENOUS

## 2018-06-30 MED ORDER — SODIUM BICARBONATE 8.4 % IV SOLN
INTRAVENOUS | Status: AC
  Administered 2018-06-30: 50 meq via INTRAVENOUS
  Filled 2018-06-30: qty 50

## 2018-06-30 MED ORDER — NOREPINEPHRINE 16 MG/250ML-% IV SOLN
0.0000 ug/min | INTRAVENOUS | Status: DC
  Administered 2018-06-30: 20 ug/min via INTRAVENOUS
  Administered 2018-07-01: 22 ug/min via INTRAVENOUS
  Administered 2018-07-01: 33 ug/min via INTRAVENOUS
  Filled 2018-06-30 (×4): qty 250

## 2018-06-30 MED ORDER — SODIUM BICARBONATE 8.4 % IV SOLN
INTRAVENOUS | Status: AC
  Filled 2018-06-30: qty 100

## 2018-06-30 MED ORDER — PROPOFOL 1000 MG/100ML IV EMUL
INTRAVENOUS | Status: AC
  Administered 2018-06-30: 5 ug/kg/min via INTRAVENOUS
  Filled 2018-06-30: qty 100

## 2018-06-30 MED ORDER — SODIUM BICARBONATE 8.4 % IV SOLN
100.0000 meq | Freq: Once | INTRAVENOUS | Status: AC
  Administered 2018-06-30: 100 meq via INTRAVENOUS

## 2018-06-30 MED ORDER — ARTIFICIAL TEARS OPHTHALMIC OINT
1.0000 "application " | TOPICAL_OINTMENT | Freq: Three times a day (TID) | OPHTHALMIC | Status: DC
  Administered 2018-06-30 – 2018-07-01 (×3): 1 via OPHTHALMIC
  Filled 2018-06-30: qty 3.5

## 2018-06-30 MED ORDER — ARTIFICIAL TEARS OPHTHALMIC OINT: 1.0000 "application " | TOPICAL_OINTMENT | Freq: Three times a day (TID) | OPHTHALMIC | Status: DC

## 2018-06-30 MED ORDER — FENTANYL BOLUS VIA INFUSION
50.0000 ug | INTRAVENOUS | Status: DC | PRN
  Filled 2018-06-30: qty 50

## 2018-06-30 MED ORDER — SODIUM CHLORIDE 0.9 % IV BOLUS
500.0000 mL | Freq: Once | INTRAVENOUS | Status: AC
  Administered 2018-06-30: 500 mL via INTRAVENOUS

## 2018-06-30 MED ORDER — FENTANYL 2500MCG IN NS 250ML (10MCG/ML) PREMIX INFUSION
100.0000 ug/h | INTRAVENOUS | Status: DC
  Administered 2018-06-30: 200 ug/h via INTRAVENOUS
  Administered 2018-07-01: 300 ug/h via INTRAVENOUS
  Administered 2018-07-01: 250 ug/h via INTRAVENOUS
  Filled 2018-06-30 (×3): qty 250

## 2018-06-30 MED ORDER — SODIUM CHLORIDE 0.9 % IV SOLN
INTRAVENOUS | Status: DC
  Administered 2018-06-30 – 2018-07-02 (×4): via INTRAVENOUS

## 2018-06-30 MED ORDER — ASPIRIN 300 MG RE SUPP: 300.0000 mg | RECTAL | Status: DC

## 2018-06-30 MED ORDER — SODIUM CHLORIDE 0.9 % IV SOLN
80.0000 mg | Freq: Once | INTRAVENOUS | Status: AC
  Administered 2018-06-30: 80 mg via INTRAVENOUS
  Filled 2018-06-30: qty 80

## 2018-06-30 MED ORDER — PIPERACILLIN-TAZOBACTAM 3.375 G IVPB
3.3750 g | Freq: Three times a day (TID) | INTRAVENOUS | Status: DC
  Administered 2018-07-01 – 2018-07-02 (×4): 3.375 g via INTRAVENOUS
  Filled 2018-06-30 (×5): qty 50

## 2018-06-30 MED ORDER — SODIUM CHLORIDE 0.9 % IV SOLN
8.0000 mg/h | INTRAVENOUS | Status: DC
  Administered 2018-06-30 – 2018-07-01 (×2): 8 mg/h via INTRAVENOUS
  Filled 2018-06-30 (×3): qty 80

## 2018-06-30 NOTE — Progress Notes (Signed)
Patient transported to CT for abdomen and up to room 2H05 without complications.

## 2018-06-30 NOTE — ED Provider Notes (Signed)
[facility] EMERGENCY DEPARTMENT Provider Note   CSN: [id number] Arrival date & time: 06/30/18  1359     History   Chief Complaint Chief Complaint  Patient presents with  . Cardiac Arrest    HPI Christopher Dominguez is a 57 y.o. male.  Patient is a 57 year old male with no known past medical history who presents after a cardiac arrest.  Per EMS report, per the patient's wife, he been complaining of indigestion last night and has taken a lot of Tums.  She found them today unresponsive.  It is unclear when his downtime was.  Patient was in asystole on EMS arrival.  He was given 7 epinephrines.  He went into a sinus rhythm following that.  He initially had a King airway placed but this was changed out to an ET tube by EMS.  His rhythm went from asystole to a sinus rhythm.  He was hypotensive and placed on epinephrine drip.  His total CPR time was around 26 minutes.     Past Medical History:  Diagnosis Date  . Depression   . Depression     There are no active problems to display for this patient.   History reviewed. No pertinent surgical history.      Home Medications    Prior to Admission medications   Medication Sig Start Date End Date Taking? Authorizing Provider  acetaminophen (TYLENOL) 500 MG tablet Take 1,000 mg by mouth every 6 (six) hours as needed for moderate pain.   Yes [provider]  ibuprofen (ADVIL,MOTRIN) 200 MG tablet Take 400 mg by mouth as needed for headache or mild pain.   Yes [provider]  tamsulosin (FLOMAX) 0.4 MG CAPS capsule Take 0.4 mg by mouth daily. 03/30/18  Yes [provider]  diclofenac (VOLTAREN) 75 MG EC tablet Take 1 tablet (75 mg total) by mouth 2 (two) times daily. Patient not taking: Reported on 06/30/2018 10/19/14   Ozella Rocks, MD  diclofenac sodium (VOLTAREN) 1 % GEL Apply 2-4 g topically 4 (four) times daily. Patient not taking: Reported on 06/30/2018 10/19/14   Ozella Rocks, MD    docusate sodium (COLACE) 100 MG capsule Take 1 capsule (100 mg total) by mouth every 12 (twelve) hours. Patient not taking: Reported on 06/30/2018 05/09/16   Glynn Octave, MD  naproxen (NAPROSYN) 500 MG tablet Take 1 tablet (500 mg total) by mouth 3 (three) times daily with meals. Patient not taking: Reported on 06/30/2018 08/13/15   Earley Favor, NP    Family History No family history on file.  Social History Social History   Tobacco Use  . Smoking status: Never Smoker  . Smokeless tobacco: Never Used  Substance Use Topics  . Alcohol use: No  . Drug use: No     Allergies   Patient has no known allergies.   Review of Systems Review of Systems  Unable to perform ROS: Intubated     Physical Exam Updated Vital Signs BP (!) 90/51   Pulse 75   Temp (!) 92.5 F (33.6 C) (Rectal)   Resp (!) 38   Ht 5\' 11"  (1.803 m)   SpO2 100%   BMI 26.78 kg/m   Physical Exam  Constitutional: He appears well-developed and well-nourished.  HENT:  Head: Normocephalic and atraumatic.  Eyes:  Pupils are small bilaterally and non-reactive  Neck: Normal range of motion. Neck supple.  Cardiovascular: Normal rate, regular rhythm and normal heart sounds.  Pulmonary/Chest: Effort normal and breath  sounds normal. No respiratory distress. He has no wheezes. He has no rales. He exhibits no tenderness.  Patient is intubated with rhonchi bilaterally.  He has initially diminished breath sounds on the left.  His ET tube was retracted.  Abdominal: Soft. Bowel sounds are normal. He exhibits distension. There is no tenderness. There is no rebound and no guarding.  Musculoskeletal: Normal range of motion. He exhibits no edema.  Lymphadenopathy:    He has no cervical adenopathy.  Skin: Skin is warm and dry. No rash noted.  Psychiatric: He has a normal mood and affect.     ED Treatments / Results  Labs (all labs ordered are listed, but only abnormal results are displayed) Labs Reviewed   COMPREHENSIVE METABOLIC PANEL - Abnormal; Notable for the following components:      Result Value   CO2 17 (*)    Glucose, Bld 148 (*)    BUN 25 (*)    Creatinine, Ser 4.14 (*)    Calcium 8.0 (*)    Total Protein 4.9 (*)    Albumin 2.9 (*)    GFR calc non Af Amer 15 (*)    GFR calc Af Amer 17 (*)    Anion gap 24 (*)    All other components within normal limits  CBC WITH DIFFERENTIAL/PLATELET - Abnormal; Notable for the following components:   WBC 11.6 (*)    RBC 6.21 (*)    MCH 22.2 (*)    MCHC 27.3 (*)    RDW 18.1 (*)    All other components within normal limits  I-STAT TROPONIN, ED - Abnormal; Notable for the following components:   Troponin i, poc 0.42 (*)    All other components within normal limits  I-STAT CG4 LACTIC ACID, ED - Abnormal; Notable for the following components:   Lactic Acid, Venous 12.79 (*)    All other components within normal limits  I-STAT CHEM 8, ED - Abnormal; Notable for the following components:   BUN 38 (*)    Creatinine, Ser 3.60 (*)    Glucose, Bld 134 (*)    Calcium, Ion 0.99 (*)    TCO2 18 (*)    All other components within normal limits  I-STAT ARTERIAL BLOOD GAS, ED - Abnormal; Notable for the following components:   pH, Arterial 7.027 (*)    pO2, Arterial 160.0 (*)    Bicarbonate 12.9 (*)    TCO2 15 (*)    Acid-base deficit 19.0 (*)    All other components within normal limits  URINALYSIS, ROUTINE W REFLEX MICROSCOPIC  RAPID URINE DRUG SCREEN, HOSP PERFORMED  CK    EKG EKG Interpretation  Date/Time:  Thursday June 30 2018 14:08:04 EDT Ventricular Rate:  73 PR Interval:    QRS Duration: 114 QT Interval:  482 QTC Calculation: 532 R Axis:   77 Text Interpretation:  Sinus rhythm Borderline intraventricular conduction delay Abnormal R-wave progression, early transition Prolonged QT interval No old tracing to compare Confirmed by Rolan Bucco [phone]) on 06/30/2018 2:57:56 PM   Radiology Ct Head Wo Contrast  Result Date:  06/30/2018 CLINICAL DATA:  58 year old male with altered mental status. Cardiac arrest. EXAM: CT HEAD WITHOUT CONTRAST TECHNIQUE: Contiguous axial images were obtained from the base of the skull through the vertex without intravenous contrast. COMPARISON:  None. FINDINGS: Brain: Hypodensity within bilateral occipital lobes noted. There is equivocal cortical hypodensity/edema within the RIGHT frontoparietal region (series 3: Image 27). Question anoxic injury vs posterior reversible encephalopathy syndrome (PRES). No hemorrhage,  extra-axial collection, hydrocephalus, midline shift or mass effect. Vascular: No hyperdense vessel or unexpected calcification. Skull: Normal. Negative for fracture or focal lesion. Sinuses/Orbits: No acute finding. Other: Oral intubation tubes noted. IMPRESSION: 1. Bilateral occipital edema and equivocal RIGHT frontoparietal edema-question anoxic injury or posterior reversible encephalopathy syndrome. No hemorrhage or midline shift. Electronically Signed   By: Harmon PierJeffrey  Hu M.D.   On: 06/30/2018 14:51   Dg Chest Portable 1 View  Result Date: 06/30/2018 CLINICAL DATA:  Cardiac arrest, CPR EXAM: PORTABLE CHEST 1 VIEW COMPARISON:  None. FINDINGS: Endotracheal tube is 2 cm above the carina. Heart is borderline in size. No confluent airspace opacities, effusions or edema. No pneumothorax. No visible acute bony abnormality. IMPRESSION: Endotracheal tube 2 cm above the carina. No acute cardiopulmonary disease. Electronically Signed   By: Charlett NoseKevin  Dover M.D.   On: 06/30/2018 14:14    Procedures Procedures (including critical care time)  Medications Ordered in ED Medications  norepinephrine (LEVOPHED) 4mg  in D5W 250mL premix infusion (0 mcg/min Intravenous Hold 06/30/18 1454)  sodium chloride 0.9 % bolus 1,000 mL (1,000 mLs Intravenous New Bag/Given 06/30/18 1418)  sodium chloride 0.9 % bolus 1,000 mL (1,000 mLs Intravenous New Bag/Given 06/30/18 1456)  midazolam (VERSED) injection 2 mg (has no  administration in time range)  fentaNYL (SUBLIMAZE) injection 50 mcg (has no administration in time range)  fentaNYL (SUBLIMAZE) injection 100 mcg (100 mcg Intravenous Given 06/30/18 1437)     Initial Impression / Assessment and Plan / ED Course  I have reviewed the triage vital signs and the nursing notes.  Pertinent labs & imaging results that were available during my care of the patient were reviewed by me and considered in my medical decision making (see chart for details).     Patient is a 57 year old male who presents following a cardiac arrest.  He has an ET tube in place.  This was verified with glide scope and is in correct position.  Initially seem to be right mainstem and was withdrawn.  A follow-up chest x-ray shows good placement.  His lungs otherwise doing clear.  He was hypothermic on arrival.  His EKG does not show STEMI changes.  His labs show a marked lactic acidosis and acute kidney injury.  His potassium is normal.  He was given IV fluids.  He initially was going to be started on a Levophed drip as his blood pressure had dropped back in the 90s.  However this stabilized and went back up.  IV fluids were continued.  He was given some fentanyl and Versed for sedation.  Dr. Wallace CullensGray was consulted with the intensivist service and will see the patient.  Final Clinical Impressions(s) / ED Diagnoses   Final diagnoses:  Cardiac arrest [facility](HCC)  Metabolic acidosis  Anoxic brain injury [facility](HCC)    ED Discharge Orders    None       Rolan BuccoBelfi, Keia Rask, MD 06/30/18 1507

## 2018-06-30 NOTE — ED Notes (Addendum)
Dr. Fredderick PhenixBelfi notified of elevated Trop & CG-4

## 2018-06-30 NOTE — H&P (Signed)
PULMONARY / CRITICAL CARE MEDICINE   Name: Christopher Dominguez MRN: [id number] DOB: 04-28-1961    ADMISSION DATE:  06/30/2018   CHIEF COMPLAINT: Out of hospital cardiac arrest.  HISTORY OF PRESENT ILLNESS:        This is a 57 year old who is suffered from out of hospital cardiac arrest.  We have essentially no history.  Last night he was complaining of indigestion but we do not nothing else about his recent history.  He was found down today and asystolic and CPR was initiated there was return of circulation after 7 rounds of epinephrine.  He was intubated apparently without any sedation.  He remains unresponsive in the department of emergency medicine. Medication history indicates that he has been taking substantial amounts of nonsteroidals.   PAST MEDICAL HISTORY :  He  has a past medical history of Depression and Depression.  PAST SURGICAL HISTORY: He  has no past surgical history on file.  No Known Allergies  No current facility-administered medications on file prior to encounter.    Current Outpatient Medications on File Prior to Encounter  Medication Sig  . acetaminophen (TYLENOL) 500 MG tablet Take 1,000 mg by mouth every 6 (six) hours as needed for moderate pain.  Marland Kitchen ibuprofen (ADVIL,MOTRIN) 200 MG tablet Take 400 mg by mouth as needed for headache or mild pain.  . tamsulosin (FLOMAX) 0.4 MG CAPS capsule Take 0.4 mg by mouth daily.  . diclofenac (VOLTAREN) 75 MG EC tablet Take 1 tablet (75 mg total) by mouth 2 (two) times daily. (Patient not taking: Reported on 06/30/2018)  . diclofenac sodium (VOLTAREN) 1 % GEL Apply 2-4 g topically 4 (four) times daily. (Patient not taking: Reported on 06/30/2018)  . docusate sodium (COLACE) 100 MG capsule Take 1 capsule (100 mg total) by mouth every 12 (twelve) hours. (Patient not taking: Reported on 06/30/2018)  . naproxen (NAPROSYN) 500 MG tablet Take 1 tablet (500 mg total) by mouth 3 (three) times daily with meals. (Patient not taking: Reported on  06/30/2018)    FAMILY HISTORY:  His has no family status information on file.    SOCIAL HISTORY: He  reports that he has never smoked. He has never used smokeless tobacco. He reports that he does not drink alcohol or use drugs.  REVIEW OF SYSTEMS:   Unobtainable  SUBJECTIVE:  Unobtainable  VITAL SIGNS: BP (!) 90/51   Pulse 75   Temp (!) 92.5 F (33.6 C) (Rectal)   Resp (!) 38   Ht 5\' 11"  (1.803 m)   SpO2 100%   BMI 26.78 kg/m   HEMODYNAMICS:    VENTILATOR SETTINGS: Vent Mode: PRVC FiO2 (%):  [90 %-100 %] 90 % Set Rate:  [18 bmp-24 bmp] 24 bmp Vt Set:  [600 mL] 600 mL PEEP:  [5 cmH20] 5 cmH20  INTAKE / OUTPUT: No intake/output data recorded.  PHYSICAL EXAMINATION: General: This is a fit appearing 57 year old who is orally intubated and unresponsive Neuro: He is spontaneously decerebrating.  Pupils are equal at 2 mm, EOMs are absent by doll's, corneals are absent cough is absent gag is absent. Cardiovascular: S1 and S2 are regular without murmur rub or gallop.  There is no JVD, there is no dependent edema Lungs: There is symmetric air movement, some scattered rhonchi and no wheezes Abdomen: The abdomen is soft without any overt organomegaly masses or tenderness there is some flank discoloration which I think is more consistent with livedo than flank ecchymoses.  NG drainage is black resembling old  blood Musculoskeletal: Not remarkable Skin:    LABS:  BMET Recent Labs  Lab 06/30/18 1405 06/30/18 1417  NA 140 136  K 4.5 4.3  CL 99 100  CO2 17*  --   BUN 25* 38*  CREATININE 4.14* 3.60*  GLUCOSE 148* 134*    Electrolytes Recent Labs  Lab 06/30/18 1405  CALCIUM 8.0*    CBC Recent Labs  Lab 06/30/18 1405 06/30/18 1417  WBC 11.6*  --   HGB 13.8 16.3  HCT 50.5 48.0  PLT 160  --     Coag's No results for input(s): APTT, INR in the last 168 hours.  Sepsis Markers Recent Labs  Lab 06/30/18 1418  LATICACIDVEN 12.79*    ABG Recent Labs   Lab 06/30/18 1449  PHART 7.027*  PCO2ART 46.1  PO2ART 160.0*    Liver Enzymes Recent Labs  Lab 06/30/18 1405  AST 2,690*  ALT 3,795*  ALKPHOS 70  BILITOT 0.8  ALBUMIN 2.9*    Cardiac Enzymes No results for input(s): TROPONINI, PROBNP in the last 168 hours.  Glucose No results for input(s): GLUCAP in the last 168 hours.  Imaging Ct Head Wo Contrast  Result Date: 06/30/2018 CLINICAL DATA:  57 year old male with altered mental status. Cardiac arrest. EXAM: CT HEAD WITHOUT CONTRAST TECHNIQUE: Contiguous axial images were obtained from the base of the skull through the vertex without intravenous contrast. COMPARISON:  None. FINDINGS: Brain: Hypodensity within bilateral occipital lobes noted. There is equivocal cortical hypodensity/edema within the RIGHT frontoparietal region (series 3: Image 27). Question anoxic injury vs posterior reversible encephalopathy syndrome (PRES). No hemorrhage, extra-axial collection, hydrocephalus, midline shift or mass effect. Vascular: No hyperdense vessel or unexpected calcification. Skull: Normal. Negative for fracture or focal lesion. Sinuses/Orbits: No acute finding. Other: Oral intubation tubes noted. IMPRESSION: 1. Bilateral occipital edema and equivocal RIGHT frontoparietal edema-question anoxic injury or posterior reversible encephalopathy syndrome. No hemorrhage or midline shift. Electronically Signed   By: Harmon PierJeffrey  Hu M.D.   On: 06/30/2018 14:51   Dg Chest Portable 1 View  Result Date: 06/30/2018 CLINICAL DATA:  Cardiac arrest, CPR EXAM: PORTABLE CHEST 1 VIEW COMPARISON:  None. FINDINGS: Endotracheal tube is 2 cm above the carina. Heart is borderline in size. No confluent airspace opacities, effusions or edema. No pneumothorax. No visible acute bony abnormality. IMPRESSION: Endotracheal tube 2 cm above the carina. No acute cardiopulmonary disease. Electronically Signed   By: Charlett NoseKevin  Dover M.D.   On: 06/30/2018 14:14   Dg Abd Portable 1v  Result  Date: 06/30/2018 CLINICAL DATA:  Orogastric tube placement. EXAM: PORTABLE ABDOMEN - 1 VIEW COMPARISON:  None. FINDINGS: Orogastric tube extends well below the diaphragm to curl within the stomach. Normal bowel gas pattern. IMPRESSION: Well-positioned orogastric tube. Electronically Signed   By: Amie Portlandavid  Ormond M.D.   On: 06/30/2018 15:16     STUDIES:  EKG to my eye shows sinus without acute changes.  CT of the head shows a preserved white-gray interface however the sulci are tight  ANTIBIOTICS: None  SIGNIFICANT EVENTS:   LINES/TUBES:   DISCUSSION:      This is a 57 year old status post unwitnessed out of hospital cardiopulmonary arrest  ASSESSMENT / PLAN:  PULMONARY A: Chest x-ray does not suggest acute issues such as aspiration or infiltrate.  CARDIOVASCULAR A: We are fluid loading the patient empirically at present I will get a better feel for his volume status was transferred to the intensive care unit.  For now blood pressure support will be with levo  fed as needed.  There is no indication that his arrest was on the basis of ischemia however renal enzymes have been ordered.  Other considerations as to the etiology of his arrest include an intra-abdominal catastrophe or GI bleed manifested as his abdominal discomfort prior to arrest.  A CT scan of the abdomen is pending.  A tox screen is also pending to rule out a toxicologic provocation for his arrest and Dopplers of the lower extremity are pending to rule out thromboembolic disease.  We will be maintaining the patient's temperature below 36 degrees for the next 24 hours, he is currently 31 degrees without cooling   RENAL A: Past history is not known it is not clear whether his elevated creatinine is acute or chronic  GASTROINTESTINAL A: Very concerned by the appearance of his NG drainage, a stat CT scan of the abdomen and pelvis without contrast has been ordered to rule out perforation I have placed him on double dose  PPI.  HEMATOLOGIC A: I am not using pharmacologic DVT prophylaxis at present until it is clear that he is not GI bleeding  INFECTIOUS A: No issues identified at this point  ENDOCRINE A: History is unknown  NEUROLOGIC A: He has a very poor neurological examination at present and is received no sedative agents from [place].  We are going to cool him as noted.  Greater than 32 minutes has been spent in the care of this patient today who is requiring acute life support measures.  Penny Pia, MD Critical Care Medicine [facility] Pager: [phone]  06/30/2018, 3:22 PM

## 2018-06-30 NOTE — Progress Notes (Signed)
Patient transported to CT and back to trauma room A without complications.  

## 2018-06-30 NOTE — Progress Notes (Signed)
Post intubation ABG obtained.  Obtained on settings of tidal volume of 600, respiratory rate of 18, FIO2 of 100% and PEEP of 5.  Results given to Dr. Fredderick PhenixBelfi and Dr. Wallace CullensGray.  Per Dr. Wallace CullensGray increased respiratory rate to 24 and decreased FIO2 to 90%.     Ref. Range 06/30/2018 14:49  Sample type Unknown ARTERIAL  pH, Arterial Latest Ref Range: 7.350 - 7.450  7.027 (LL)  pCO2 arterial Latest Ref Range: 32.0 - 48.0 mmHg 46.1  pO2, Arterial Latest Ref Range: 83.0 - 108.0 mmHg 160.0 (H)  TCO2 Latest Ref Range: 22 - 32 mmol/L 15 (L)  Acid-base deficit Latest Ref Range: 0.0 - 2.0 mmol/L 19.0 (H)  Bicarbonate Latest Ref Range: 20.0 - 28.0 mmol/L 12.9 (L)  O2 Saturation Latest Units: % 99.0  Patient temperature Unknown 33.2 C  Collection site Unknown RADIAL, ALLEN'S T.Marland Kitchen..Marland Kitchen

## 2018-06-30 NOTE — Procedures (Signed)
Central venous catheter placement  Indication: Administration of pressors in a patient status post cardiopulmonary arrest  Procedure: The procedure was performed as emergent and medically indicated.  The area over the left subclavian was extensively prepped with chlorhexidine then widely draped.  Sterile garb was donned and the left subclavian vein easily cannulated.  A wire was gently passed, the skin was sharply incised and the tract dilated.  A 20 cm triple-lumen catheter was placed over the wire and sutured in place at 20 cm.  There was good flow from all ports.  Chest x-ray shows good line placement and no pneumothorax.

## 2018-06-30 NOTE — Progress Notes (Signed)
Call Dr. Wallace CullensGray for critical potassium at 6.4. Verbal order given for bicarb x 1. 60 mg kayexalate. Will continue to monitor

## 2018-06-30 NOTE — Progress Notes (Signed)
Patient arrived via EMS intubated with 8.0 ETT.  Patient placed on ventilator without complications.

## 2018-06-30 NOTE — ED Triage Notes (Addendum)
Pt presents as cardiac arrest. Family noted patient had epigastric discomfort last night that he felt was indigestion, was taking tums all evening. Significant other found patient gurgling in bed this AM. Pt given 6 epi in route, initial rhythm asystole for EMS. CPR time 26 min. Intubated in route. ROSC PTA.

## 2018-06-30 NOTE — Procedures (Signed)
Arterial line placement  Indication: Continuous blood pressure monitoring and frequent blood gas sampling in a patient who is hemodynamically stable following out of hospital cardiac arrest  Procedure: The procedure performed as medically indicated as we have no surrogate for the patient.  The area over the left groin was extensively prepped with chlorhexidine and sterilely draped.  The left femoral artery was identified by palpation and easily cannulated.  A wire was gently passed and the skin sharply incised.  A 20-gauge catheter was placed over the wire and sutured in place.  There was good tracing and flow and no immediate complication.  A sterile dressing has been applied.

## 2018-06-30 NOTE — ED Notes (Signed)
Dr. Fredderick PhenixBelfi aware of patient rectal temperature. Will place temp foley per verbal order and wait for further orders.

## 2018-07-01 ENCOUNTER — Encounter (HOSPITAL_COMMUNITY): Payer: Self-pay | Admitting: *Deleted

## 2018-07-01 ENCOUNTER — Other Ambulatory Visit: Payer: Self-pay

## 2018-07-01 ENCOUNTER — Inpatient Hospital Stay (HOSPITAL_COMMUNITY): Payer: Medicare Other

## 2018-07-01 DIAGNOSIS — R9431 Abnormal electrocardiogram [ECG] [EKG]: Secondary | ICD-10-CM

## 2018-07-01 DIAGNOSIS — I5030 Unspecified diastolic (congestive) heart failure: Secondary | ICD-10-CM

## 2018-07-01 DIAGNOSIS — R570 Cardiogenic shock: Secondary | ICD-10-CM

## 2018-07-01 DIAGNOSIS — R609 Edema, unspecified: Secondary | ICD-10-CM

## 2018-07-01 DIAGNOSIS — I469 Cardiac arrest, cause unspecified: Secondary | ICD-10-CM

## 2018-07-01 DIAGNOSIS — I5021 Acute systolic (congestive) heart failure: Secondary | ICD-10-CM

## 2018-07-01 DIAGNOSIS — I509 Heart failure, unspecified: Secondary | ICD-10-CM

## 2018-07-01 LAB — CBC WITH DIFFERENTIAL/PLATELET
Basophils Absolute: 0 10*3/uL (ref 0.0–0.1)
Basophils Relative: 0 %
EOS PCT: 2 %
Eosinophils Absolute: 0.1 10*3/uL (ref 0.0–0.7)
HCT: 42.9 % (ref 39.0–52.0)
Hemoglobin: 13.2 g/dL (ref 13.0–17.0)
LYMPHS ABS: 0.5 10*3/uL — AB (ref 0.7–4.0)
Lymphocytes Relative: 11 %
MCH: 22.1 pg — AB (ref 26.0–34.0)
MCHC: 30.8 g/dL (ref 30.0–36.0)
MCV: 71.9 fL — ABNORMAL LOW (ref 78.0–100.0)
MONO ABS: 0.1 10*3/uL (ref 0.1–1.0)
Monocytes Relative: 2 %
Neutro Abs: 3.8 10*3/uL (ref 1.7–7.7)
Neutrophils Relative %: 85 %
PLATELETS: 165 10*3/uL (ref 150–400)
RBC: 5.97 MIL/uL — AB (ref 4.22–5.81)
RDW: 16.8 % — AB (ref 11.5–15.5)
WBC: 4.5 10*3/uL (ref 4.0–10.5)

## 2018-07-01 LAB — POCT I-STAT 3, ART BLOOD GAS (G3+)
ACID-BASE DEFICIT: 7 mmol/L — AB (ref 0.0–2.0)
ACID-BASE DEFICIT: 8 mmol/L — AB (ref 0.0–2.0)
ACID-BASE DEFICIT: 8 mmol/L — AB (ref 0.0–2.0)
Bicarbonate: 15.6 mmol/L — ABNORMAL LOW (ref 20.0–28.0)
Bicarbonate: 15.6 mmol/L — ABNORMAL LOW (ref 20.0–28.0)
Bicarbonate: 17.5 mmol/L — ABNORMAL LOW (ref 20.0–28.0)
O2 SAT: 100 %
O2 SAT: 98 %
O2 SAT: 98 %
PH ART: 7.387 (ref 7.350–7.450)
PO2 ART: 98 mmHg (ref 83.0–108.0)
Patient temperature: 35.8
TCO2: 16 mmol/L — ABNORMAL LOW (ref 22–32)
TCO2: 16 mmol/L — ABNORMAL LOW (ref 22–32)
TCO2: 19 mmol/L — AB (ref 22–32)
pCO2 arterial: 23.3 mmHg — ABNORMAL LOW (ref 32.0–48.0)
pCO2 arterial: 25.6 mmHg — ABNORMAL LOW (ref 32.0–48.0)
pCO2 arterial: 35.1 mmHg (ref 32.0–48.0)
pH, Arterial: 7.429 (ref 7.350–7.450)
pH, Arterial: 7.301 — ABNORMAL LOW (ref 7.350–7.450)
pO2, Arterial: 102 mmHg (ref 83.0–108.0)
pO2, Arterial: 188 mmHg — ABNORMAL HIGH (ref 83.0–108.0)

## 2018-07-01 LAB — C DIFFICILE QUICK SCREEN W PCR REFLEX
C Diff antigen: NEGATIVE
C Diff interpretation: NOT DETECTED
C Diff toxin: NEGATIVE

## 2018-07-01 LAB — COMPREHENSIVE METABOLIC PANEL
ALBUMIN: 2.5 g/dL — AB (ref 3.5–5.0)
ALT: 3839 U/L — ABNORMAL HIGH (ref 0–44)
ANION GAP: 19 — AB (ref 5–15)
AST: 4019 U/L — ABNORMAL HIGH (ref 15–41)
Alkaline Phosphatase: 35 U/L — ABNORMAL LOW (ref 38–126)
BILIRUBIN TOTAL: 1.3 mg/dL — AB (ref 0.3–1.2)
BUN: 37 mg/dL — ABNORMAL HIGH (ref 6–20)
CO2: 14 mmol/L — ABNORMAL LOW (ref 22–32)
Calcium: 7.2 mg/dL — ABNORMAL LOW (ref 8.9–10.3)
Chloride: 105 mmol/L (ref 98–111)
Creatinine, Ser: 4.36 mg/dL — ABNORMAL HIGH (ref 0.61–1.24)
GFR calc Af Amer: 16 mL/min — ABNORMAL LOW (ref >60)
GFR, EST NON AFRICAN AMERICAN: 14 mL/min — AB (ref >60)
Glucose, Bld: 110 mg/dL — ABNORMAL HIGH (ref 70–99)
POTASSIUM: 3.8 mmol/L (ref 3.5–5.1)
Sodium: 138 mmol/L (ref 135–145)
Total Protein: 4.1 g/dL — ABNORMAL LOW (ref 6.5–8.1)

## 2018-07-01 LAB — GLUCOSE, CAPILLARY
GLUCOSE-CAPILLARY: 107 mg/dL — AB (ref 70–99)
GLUCOSE-CAPILLARY: 58 mg/dL — AB (ref 70–99)
Glucose-Capillary: 96 mg/dL (ref 70–99)
Glucose-Capillary: 107 mg/dL — ABNORMAL HIGH (ref 70–99)
Glucose-Capillary: 72 mg/dL (ref 70–99)
Glucose-Capillary: 63 mg/dL — ABNORMAL LOW (ref 70–99)

## 2018-07-01 LAB — BASIC METABOLIC PANEL
ANION GAP: 18 — AB (ref 5–15)
BUN: 36 mg/dL — ABNORMAL HIGH (ref 6–20)
CALCIUM: 6.4 mg/dL — AB (ref 8.9–10.3)
CO2: 14 mmol/L — ABNORMAL LOW (ref 22–32)
Chloride: 105 mmol/L (ref 98–111)
Creatinine, Ser: 4.04 mg/dL — ABNORMAL HIGH (ref 0.61–1.24)
GFR, EST AFRICAN AMERICAN: 17 mL/min — AB (ref >60)
GFR, EST NON AFRICAN AMERICAN: 15 mL/min — AB (ref >60)
Glucose, Bld: 106 mg/dL — ABNORMAL HIGH (ref 70–99)
POTASSIUM: 4.1 mmol/L (ref 3.5–5.1)
Sodium: 137 mmol/L (ref 135–145)

## 2018-07-01 LAB — ECHOCARDIOGRAM COMPLETE
Height: 71 in
Weight: 3619.07 oz

## 2018-07-01 LAB — TROPONIN I
TROPONIN I: 1.36 ng/mL — AB (ref <0.03)
TROPONIN I: 1.12 ng/mL — AB (ref <0.03)
TROPONIN I: 1.18 ng/mL — AB (ref <0.03)

## 2018-07-01 LAB — LACTIC ACID, PLASMA
LACTIC ACID, VENOUS: 4.2 mmol/L — AB (ref 0.5–1.9)
LACTIC ACID, VENOUS: 3.5 mmol/L — AB (ref 0.5–1.9)

## 2018-07-01 LAB — MAGNESIUM: Magnesium: 1.7 mg/dL (ref 1.7–2.4)

## 2018-07-01 LAB — HIV ANTIBODY (ROUTINE TESTING W REFLEX): HIV SCREEN 4TH GENERATION: NONREACTIVE

## 2018-07-01 LAB — PROTIME-INR
INR: 2.15
Prothrombin Time: 23.8 seconds — ABNORMAL HIGH (ref 11.4–15.2)

## 2018-07-01 LAB — CK: Total CK: 5506 U/L — ABNORMAL HIGH (ref 49–397)

## 2018-07-01 LAB — PROCALCITONIN: PROCALCITONIN: 49.84 ng/mL

## 2018-07-01 LAB — APTT: aPTT: 43 seconds — ABNORMAL HIGH (ref 24–36)

## 2018-07-01 MED ORDER — DEXTROSE 50 % IV SOLN
25.0000 g | Freq: Once | INTRAVENOUS | Status: AC
  Administered 2018-07-01: 25 g via INTRAVENOUS

## 2018-07-01 MED ORDER — PERFLUTREN LIPID MICROSPHERE
1.0000 mL | INTRAVENOUS | Status: AC | PRN
  Administered 2018-07-01: 2 mL via INTRAVENOUS
  Filled 2018-07-01: qty 10

## 2018-07-01 MED ORDER — SODIUM CHLORIDE 0.9 % IV SOLN
2.0000 g | Freq: Once | INTRAVENOUS | Status: AC
  Administered 2018-07-01: 2 g via INTRAVENOUS
  Filled 2018-07-01: qty 20

## 2018-07-01 MED ORDER — PERFLUTREN LIPID MICROSPHERE
INTRAVENOUS | Status: AC
  Filled 2018-07-01: qty 10

## 2018-07-01 NOTE — Progress Notes (Signed)
  Echocardiogram 2D Echocardiogram has been performed.  Roosvelt MaserLane, Laura Radilla F 07/01/2018, 9:37 AM

## 2018-07-01 NOTE — Progress Notes (Signed)
CRITICAL VALUE ALERT  Critical Value:  Lactic acid 3.4  Date & Time Notied:  06/30/2018 11:06 PM  Provider Notified: Pola CornELink  Orders Received/Actions taken: No new orders.

## 2018-07-01 NOTE — Progress Notes (Addendum)
*  Preliminary Results* Bilateral lower extremity venous duplex completed. Bilateral lower extremities are negative for deep vein thrombosis. There is no evidence of Baker's cyst bilaterally.  07/01/2018 2:34 PM Gertie FeyMichelle Amillya Chavira, BS, RVT, RDCS, RDMS

## 2018-07-01 NOTE — Progress Notes (Addendum)
CRITICAL VALUE ALERT  Critical Value:  Calcium 6.4  Date & Time Notied:  07/01/18 1:08 AM   Provider Notified: Pola CornELink  Orders Received/Actions taken: New orders. See MAR.

## 2018-07-01 NOTE — Progress Notes (Signed)
PULMONARY / CRITICAL CARE MEDICINE   Name: Christopher Dominguez MRN: [id number] DOB: Oct 09, 1961    ADMISSION DATE:  06/30/2018   CHIEF COMPLAINT: Out of hospital cardiac arrest.  HISTORY OF PRESENT ILLNESS:        This is a 57 year old who is suffered from out of hospital cardiac arrest.  We have essentially no history.  Last night he was complaining of indigestion but we do not nothing else about his recent history.  He was found down today and asystolic and CPR was initiated there was return of circulation after 7 rounds of epinephrine.  He was intubated apparently without any sedation.  He remains unresponsive in the department of emergency medicine. Medication history indicates that he has been taking substantial amounts of nonsteroidals.   SUBJECTIVE:  No events overnight, unresponsive, no new complaints.  VITAL SIGNS: BP 112/77 (BP Location: Left Arm)   Pulse 69   Temp (!) 96.8 F (36 C) (Bladder)   Resp (!) 22   Ht 5\' 11"  (1.803 m)   Wt 226 lb 3.1 oz (102.6 kg)   SpO2 100%   BMI 31.55 kg/m   HEMODYNAMICS:    VENTILATOR SETTINGS: Vent Mode: PRVC FiO2 (%):  [80 %-100 %] 80 % Set Rate:  [18 bmp-24 bmp] 24 bmp Vt Set:  [600 mL-750 mL] 750 mL PEEP:  [5 cmH20] 5 cmH20 Plateau Pressure:  [21 cmH20-27 cmH20] 24 cmH20  INTAKE / OUTPUT: I/O last 3 completed shifts: In: 4192.4 [I.V.:3889.1; Other:30; NG/GT:100; IV Piggyback:173.3] Out: 550 [Urine:150; Emesis/NG output:400]  PHYSICAL EXAMINATION: General: Acutely ill appearing, paralyzed Neuro: Paralyzed, unable to examine Cardiovascular: RRR, Nl S1/S2 and -M/R/G Lungs: CTA bilaterally Abdomen: Soft, NT, ND and +BS Musculoskeletal: Not remarkable, -edema and -tenderness Skin:  Intact  LABS:  BMET Recent Labs  Lab 06/30/18 1723 06/30/18 2344 07/01/18 0320  NA 141  137 137 138  K 6.4*  6.2* 4.1 3.8  CL 107  105 105 105  CO2 16* 14* 14*  BUN 27*  35* 36* 37*  CREATININE 4.00*  3.80* 4.04* 4.36*  GLUCOSE  101*  97 106* 110*   Electrolytes Recent Labs  Lab 06/30/18 1723 06/30/18 2344 07/01/18 0320  CALCIUM 6.8* 6.4* 7.2*   CBC Recent Labs  Lab 06/30/18 1405 06/30/18 1417 06/30/18 1723 07/01/18 0320  WBC 11.6*  --   --  4.5  HGB 13.8 16.3 14.6 13.2  HCT 50.5 48.0 43.0 42.9  PLT 160  --   --  165   Coag's Recent Labs  Lab 06/30/18 1723 06/30/18 2344  APTT 43* 43*  INR 1.88 2.15   Sepsis Markers Recent Labs  Lab 06/30/18 2210 07/01/18 0320 07/01/18 0653  LATICACIDVEN 3.8* 4.2* 3.5*  PROCALCITON 35.56 49.84  --     ABG Recent Labs  Lab 06/30/18 1821 07/01/18 0005 07/01/18 0523  PHART 7.263* 7.387 7.429  PCO2ART 51.3* 25.6* 23.3*  PO2ART 74.0* 98.0 102.0    Liver Enzymes Recent Labs  Lab 06/30/18 1405 07/01/18 0320  AST 2,690* 4,019*  ALT 3,795* 3,839*  ALKPHOS 70 35*  BILITOT 0.8 1.3*  ALBUMIN 2.9* 2.5*    Cardiac Enzymes Recent Labs  Lab 06/30/18 1723 06/30/18 2344 07/01/18 0320  TROPONINI 0.64* 1.12* 1.36*    Glucose Recent Labs  Lab 06/30/18 2212 06/30/18 2344 07/01/18 0327  GLUCAP 114* 115* 107*    Imaging Ct Abdomen Pelvis Wo Contrast  Result Date: 06/30/2018 CLINICAL DATA:  Patient unable to provide history. Out of hospital cardiac  arrest. EXAM: CT ABDOMEN AND PELVIS WITHOUT CONTRAST TECHNIQUE: Multidetector CT imaging of the abdomen and pelvis was performed following the standard protocol without IV contrast. COMPARISON:  05/09/2016 FINDINGS: Lower chest: Hazy nodule opacification over the right middle lobe. Posterior bibasilar consolidation likely atelectasis right worse than left. No effusion. Hepatobiliary: Within normal. Pancreas: Normal. Spleen: Normal. Adrenals/Urinary Tract: Adrenal glands are normal. Kidneys are normal in size without hydronephrosis or nephrolithiasis. Ureters and bladder are unremarkable. Foley catheter is present within the bladder with a few small foci of air present. Stomach/Bowel: Nasogastric tube is  present which is coiled once in the stomach with tip pointing posteriorly over the gastric fundus. Small bowel is normal. Appendix is normal. Colon is normal. Vascular/Lymphatic: Normal. Reproductive: Normal. Other: No free fluid or focal inflammatory change. Musculoskeletal: Mild spondylosis of the spine. IMPRESSION: No acute findings in the abdomen/pelvis. Subtle hazy nodular opacification over the right middle lobe which may be due to infection or an inflammatory process. Bibasilar consolidation likely atelectasis right worse than left. Nasogastric tube coiled once in the stomach with tip over the gastric fundus directed posteriorly. Electronically Signed   By: Elberta Fortis M.D.   On: 06/30/2018 16:22   Ct Head Wo Contrast  Result Date: 06/30/2018 CLINICAL DATA:  57 year old male with altered mental status. Cardiac arrest. EXAM: CT HEAD WITHOUT CONTRAST TECHNIQUE: Contiguous axial images were obtained from the base of the skull through the vertex without intravenous contrast. COMPARISON:  None. FINDINGS: Brain: Hypodensity within bilateral occipital lobes noted. There is equivocal cortical hypodensity/edema within the RIGHT frontoparietal region (series 3: Image 27). Question anoxic injury vs posterior reversible encephalopathy syndrome (PRES). No hemorrhage, extra-axial collection, hydrocephalus, midline shift or mass effect. Vascular: No hyperdense vessel or unexpected calcification. Skull: Normal. Negative for fracture or focal lesion. Sinuses/Orbits: No acute finding. Other: Oral intubation tubes noted. IMPRESSION: 1. Bilateral occipital edema and equivocal RIGHT frontoparietal edema-question anoxic injury or posterior reversible encephalopathy syndrome. No hemorrhage or midline shift. Electronically Signed   By: Harmon Pier M.D.   On: 06/30/2018 14:51   Dg Chest Port 1 View  Result Date: 06/30/2018 CLINICAL DATA:  Central line placement. EXAM: PORTABLE CHEST 1 VIEW COMPARISON:  06/30/2018 at 1403  hours FINDINGS: New left subclavian central venous line has its tip in the mid superior vena cava, just inferior to the left brachiocephalic vein, superior vena cava confluence. No pneumothorax. New nasal/orogastric tube passes below the diaphragm into the stomach and below the included field of view. Endotracheal tube is stable, well-positioned. There are interstitial and intervening hazy airspace type opacities which are centrally distributed, most evident in the left perihilar region, accentuated by the supine technique. IMPRESSION: 1. New left subclavian central venous line tip projects in the mid superior vena cava. No pneumothorax. 2. New nasal/orogastric tube passes below the diaphragm into the stomach, below the included field of view. 3. Lung aeration is worsened. There are central interstitial and hazy airspace lung opacities. Findings are consistent with interstitial pulmonary edema versus bilateral infection. Electronically Signed   By: Amie Portland M.D.   On: 06/30/2018 17:10   Dg Chest Portable 1 View  Result Date: 06/30/2018 CLINICAL DATA:  Cardiac arrest, CPR EXAM: PORTABLE CHEST 1 VIEW COMPARISON:  None. FINDINGS: Endotracheal tube is 2 cm above the carina. Heart is borderline in size. No confluent airspace opacities, effusions or edema. No pneumothorax. No visible acute bony abnormality. IMPRESSION: Endotracheal tube 2 cm above the carina. No acute cardiopulmonary disease. Electronically Signed  By: Charlett NoseKevin  Dover M.D.   On: 06/30/2018 14:14   Dg Abd Portable 1v  Result Date: 06/30/2018 CLINICAL DATA:  Orogastric tube placement. EXAM: PORTABLE ABDOMEN - 1 VIEW COMPARISON:  None. FINDINGS: Orogastric tube extends well below the diaphragm to curl within the stomach. Normal bowel gas pattern. IMPRESSION: Well-positioned orogastric tube. Electronically Signed   By: Amie Portlandavid  Ormond M.D.   On: 06/30/2018 15:16     STUDIES:  EKG to my eye shows sinus without acute changes.  CT of the head shows a  preserved white-gray interface however the sulci are tight  ANTIBIOTICS: Zosyn 7/4>>>  SIGNIFICANT EVENTS:  7/4>>> cardiac arrest  LINES/TUBES: ETT 7/4>>> R IJ TLC 7/4>>> R radial a-line 7/4>>>  Cultures: Blood 7/4>>>  DISCUSSION:      This is a 57 year old status post unwitnessed out of hospital cardiopulmonary arrest.  Currently on 36 degrees and paralyzed  ASSESSMENT / PLAN:  PULMONARY A: VDRF post cardiac arrest P:   - Full vent support - Decrease RR to 20 and Tv to 8 cc/kg, increase PEEP to 10. - Titrate O2 for sat of 88-92%  CARDIOVASCULAR A:  Cardiac arrest Cardiogenic shock P:  - Tele monitoring - Vasopressin 0.03, levo 30. - IVF resuscitation - Echo being done now - Cards consult called 7/5  RENAL A:   Unknown baseline, assume acute renal failure P:   - Hydrate, NS 75 ml/hr - Replace electrolytes as indicated - BMET in AM - Avoid all nephrotoxic drugs - Maintain perfusion  GASTROINTESTINAL A:   Concern for upper GI bleed, blood appears old, no evidence of new blood on lavage P:   - NPO - Protonix BID - Hold off TF for now  HEMATOLOGIC A:   No active P:  - CBC in AM - Transfuse per ICU protocol  INFECTIOUS A:   ?aspiration pneumonia P:   - F/u on culture - Zosyn  ENDOCRINE A:   No active issues   P:   - Monitor  NEUROLOGIC A:   Concern for anoxic brain injury on CT bilateral posterior P:   RASS goal: 0 - Repeat head CT today - EEG being done - Neurology consult called 7/5  FAMILY  - Updates: Girlfriend bedside, CSW is to obtain family contact information for updates  - Inter-disciplinary family meet or Palliative Care meeting due by:  day 7  The patient is critically ill with multiple organ systems failure and requires high complexity decision making for assessment and support, frequent evaluation and titration of therapies, application of advanced monitoring technologies and extensive interpretation of multiple  databases.   Critical Care Time devoted to patient care services described in this note is  45  Minutes. This time reflects time of care of this signee Dr Koren BoundWesam Saadia Dewitt. This critical care time does not reflect procedure time, or teaching time or supervisory time of PA/NP/Med student/Med Resident etc but could involve care discussion time.  Alyson ReedyWesam G. Shima Compere, M.D. [facility]. Pager: [phone]. After hours pager: [phone].  07/01/2018, 8:59 AM

## 2018-07-01 NOTE — Consult Note (Addendum)
NEURO HOSPITALIST CONSULT NOTE   Requestig physician: Dr. Pearline Cables  Reason for Consult: Neurological prognosis, status-post cardiac arrest  History obtained from:  Chart review  HPI:      Patient on 43mg/kg/min of propofol                                                                                                                               Christopher MCNAYis an 57y.o. male with unknown PMH [facility]   Per chart 06/29/18 he complained of indigestion. He was then found down on 06/30/18 asystole and CPR was initiated.  ROSC achieved after 7 rounds of epi. Patient intubated w/o sedation. He remained unresponsive in the ED. Medication history indicated that he has been taking a substantial amount of NSAIDS.  Hospital Course: Head CT 06/30/18: Bilateral occipital edema and equivocal RIGHT frontoparietal edema-question anoxic injury or posterior reversible encephalopathy syndrome. No hemorrhage or midline shift. Labs on arrival: CMP: AST: 2690, ALT: 3795, Creatinine: 4.14, BUN: 25 BG: 148 and GFR: 17. Lactic acid 12.79, troponin: 0.42 Toxicology was (+) for cocaine and (+) for Tetrahydrocannabinol.  EEG: pending Repeat Head CT : The bilateral occipital hypodensity is now resolved. This raises the question of the prior CT finding possibly having been due to artifact.    Past Medical History:  Diagnosis Date  . Depression   . Depression     History reviewed. No pertinent surgical history.   Social History:  reports that he has never smoked. He has never used smokeless tobacco. He reports that he does not drink alcohol or use drugs.  No Known Allergies  MEDICATIONS:                                                                                                                     Scheduled: . artificial tears  1 application Both Eyes QN8G . chlorhexidine gluconate (MEDLINE KIT)  15 mL Mouth Rinse BID  . heparin  5,000  Units Subcutaneous Q8H  . mouth rinse  15 mL Mouth Rinse 10 times per day  . midazolam  2 mg Intravenous Once  . [START ON 07/04/2018] pantoprazole  40 mg Intravenous Q12H  . pantoprazole sodium  40 mg Per Tube BID  . perflutren lipid microspheres (DEFINITY) IV suspension  Continuous: . sodium chloride 10 mL/hr at 07/01/18 0900  . sodium chloride 75 mL/hr at 07/01/18 0900  . cisatracurium (NIMBEX) infusion 3 mcg/kg/min (07/01/18 0900)  . fentaNYL infusion INTRAVENOUS 300 mcg/hr (07/01/18 0900)  . norepinephrine (LEVOPHED) Adult infusion 30 mcg/min (07/01/18 0900)  . piperacillin-tazobactam (ZOSYN)  IV 3.375 g (07/01/18 0931)  . propofol (DIPRIVAN) infusion 70 mcg/kg/min (07/01/18 0928)  . vasopressin (PITRESSIN) infusion - *FOR SHOCK* 0.03 Units/min (07/01/18 0900)   JQZ:ESPQZR chloride, fentaNYL, fentaNYL (SUBLIMAZE) injection, perflutren lipid microspheres (DEFINITY) IV suspension   ROS:                                                                                                                                       History  unobtainable from patient due to mental status  Blood pressure 112/87, pulse 69, temperature (!) 96.8 F (36 C), temperature source Bladder, resp. rate (!) 24, height 5' 11"  (1.803 m), weight 102.6 kg (226 lb 3.1 oz), SpO2 100 %.   General Examination:                                                                                                       Physical Exam  HEENT-  Normocephalic, no lesions, without obvious abnormality.  Normal external eye and conjunctiva.   Cardiovascular- S1-S2 audible, pulses palpable throughout   Lungs-intubated and sedated not breathing above the vent.  Saturations within normal limits. Extremities- Warm, dry and intact Musculoskeletal-no joint tenderness, deformity or swelling Skin-warm and dry, intact  Mental Status: on 80mg/kg/min of Propofol/ fentanyl; patient also in maintenance phase of cooling protocol.   Patient does not respond to verbal stimuli.  Does not respond to deep sternal rub.  Does not follow commands.  No verbalizations noted. (Repeat Neurological exam by attending with propofol reduced to a rate of 10, shows the same findings as initial exam by NP with patient on higher dose of propofol).  Cranial Nerves: (also unchanged on 2nd exam with propofol rate decreased to 10) II: No blink to threat bilaterally III,IV,VI: pupils right 2 mm, left 2 mm and non- reactice bilaterally, doll's response absent bilaterally.  V,VII: corneal reflex absent bilaterally  VIII: patient does not respond to verbal stimuli IX,X: gag reflex present per nursing,  XI: trapezius strength unable to test bilaterally XII: tongue strength unable to test Motor: Extremities flaccid throughout.  No spontaneous movement noted.  No purposeful movements noted. Sensory: Does not respond to noxious stimuli in any extremity. Deep Tendon  Reflexes:  Absent throughout. Plantars: Muted bilaterally Cerebellar/Gait: Unable to perform    Lab Results: Basic Metabolic Panel: Recent Labs  Lab 06/30/18 1405 06/30/18 1417 06/30/18 1723 06/30/18 2344 07/01/18 0320  NA 140 136 141  137 137 138  K 4.5 4.3 6.4*  6.2* 4.1 3.8  CL 99 100 107  105 105 105  CO2 17*  --  16* 14* 14*  GLUCOSE 148* 134* 101*  97 106* 110*  BUN 25* 38* 27*  35* 36* 37*  CREATININE 4.14* 3.60* 4.00*  3.80* 4.04* 4.36*  CALCIUM 8.0*  --  6.8* 6.4* 7.2*    CBC: Recent Labs  Lab 06/30/18 1405 06/30/18 1417 06/30/18 1723 07/01/18 0320  WBC 11.6*  --   --  4.5  NEUTROABS 9.7*  --   --  3.8  HGB 13.8 16.3 14.6 13.2  HCT 50.5 48.0 43.0 42.9  MCV 81.3  --   --  71.9*  PLT 160  --   --  165    Cardiac Enzymes: Recent Labs  Lab 06/30/18 1450 06/30/18 1723 06/30/18 2210 06/30/18 2344 07/01/18 0320  CKTOTAL 1,430*  --  5,391*  --  5,506*  TROPONINI  --  0.64*  --  1.12* 1.36*    Lipid Panel: No results for input(s): CHOL, TRIG,  HDL, CHOLHDL, VLDL, LDLCALC in the last 168 hours.  Imaging: Ct Abdomen Pelvis Wo Contrast  Result Date: 06/30/2018 CLINICAL DATA:  Patient unable to provide history. Out of hospital cardiac arrest. EXAM: CT ABDOMEN AND PELVIS WITHOUT CONTRAST TECHNIQUE: Multidetector CT imaging of the abdomen and pelvis was performed following the standard protocol without IV contrast. COMPARISON:  05/09/2016 FINDINGS: Lower chest: Hazy nodule opacification over the right middle lobe. Posterior bibasilar consolidation likely atelectasis right worse than left. No effusion. Hepatobiliary: Within normal. Pancreas: Normal. Spleen: Normal. Adrenals/Urinary Tract: Adrenal glands are normal. Kidneys are normal in size without hydronephrosis or nephrolithiasis. Ureters and bladder are unremarkable. Foley catheter is present within the bladder with a few small foci of air present. Stomach/Bowel: Nasogastric tube is present which is coiled once in the stomach with tip pointing posteriorly over the gastric fundus. Small bowel is normal. Appendix is normal. Colon is normal. Vascular/Lymphatic: Normal. Reproductive: Normal. Other: No free fluid or focal inflammatory change. Musculoskeletal: Mild spondylosis of the spine. IMPRESSION: No acute findings in the abdomen/pelvis. Subtle hazy nodular opacification over the right middle lobe which may be due to infection or an inflammatory process. Bibasilar consolidation likely atelectasis right worse than left. Nasogastric tube coiled once in the stomach with tip over the gastric fundus directed posteriorly. Electronically Signed   By: Marin Olp M.D.   On: 06/30/2018 16:22   Ct Head Wo Contrast  Result Date: 06/30/2018 CLINICAL DATA:  57 year old male with altered mental status. Cardiac arrest. EXAM: CT HEAD WITHOUT CONTRAST TECHNIQUE: Contiguous axial images were obtained from the base of the skull through the vertex without intravenous contrast. COMPARISON:  None. FINDINGS: Brain:  Hypodensity within bilateral occipital lobes noted. There is equivocal cortical hypodensity/edema within the RIGHT frontoparietal region (series 3: Image 27). Question anoxic injury vs posterior reversible encephalopathy syndrome (PRES). No hemorrhage, extra-axial collection, hydrocephalus, midline shift or mass effect. Vascular: No hyperdense vessel or unexpected calcification. Skull: Normal. Negative for fracture or focal lesion. Sinuses/Orbits: No acute finding. Other: Oral intubation tubes noted. IMPRESSION: 1. Bilateral occipital edema and equivocal RIGHT frontoparietal edema-question anoxic injury or posterior reversible encephalopathy syndrome. No hemorrhage or midline shift. Electronically Signed  By: Margarette Canada M.D.   On: 06/30/2018 14:51   Dg Chest Port 1 View  Result Date: 06/30/2018 CLINICAL DATA:  Central line placement. EXAM: PORTABLE CHEST 1 VIEW COMPARISON:  06/30/2018 at 1403 hours FINDINGS: New left subclavian central venous line has its tip in the mid superior vena cava, just inferior to the left brachiocephalic vein, superior vena cava confluence. No pneumothorax. New nasal/orogastric tube passes below the diaphragm into the stomach and below the included field of view. Endotracheal tube is stable, well-positioned. There are interstitial and intervening hazy airspace type opacities which are centrally distributed, most evident in the left perihilar region, accentuated by the supine technique. IMPRESSION: 1. New left subclavian central venous line tip projects in the mid superior vena cava. No pneumothorax. 2. New nasal/orogastric tube passes below the diaphragm into the stomach, below the included field of view. 3. Lung aeration is worsened. There are central interstitial and hazy airspace lung opacities. Findings are consistent with interstitial pulmonary edema versus bilateral infection. Electronically Signed   By: Lajean Manes M.D.   On: 06/30/2018 17:10   Dg Chest Portable 1  View  Result Date: 06/30/2018 CLINICAL DATA:  Cardiac arrest, CPR EXAM: PORTABLE CHEST 1 VIEW COMPARISON:  None. FINDINGS: Endotracheal tube is 2 cm above the carina. Heart is borderline in size. No confluent airspace opacities, effusions or edema. No pneumothorax. No visible acute bony abnormality. IMPRESSION: Endotracheal tube 2 cm above the carina. No acute cardiopulmonary disease. Electronically Signed   By: Rolm Baptise M.D.   On: 06/30/2018 14:14   Dg Abd Portable 1v  Result Date: 06/30/2018 CLINICAL DATA:  Orogastric tube placement. EXAM: PORTABLE ABDOMEN - 1 VIEW COMPARISON:  None. FINDINGS: Orogastric tube extends well below the diaphragm to curl within the stomach. Normal bowel gas pattern. IMPRESSION: Well-positioned orogastric tube. Electronically Signed   By: Lajean Manes M.D.   On: 06/30/2018 15:16    EEG:  Impression: This sedated EEG is abnormal due to mild diffuse background slowing. Clinical Correlation of the above findings indicates diffuse cerebral dysfunction that is non-specific in etiology and can be seen with hypoxic/ischemic injury, toxic/metabolic encephalopathies, or medication effect from Propofol.  There were no electrographic seizures in this study. The absence of epileptiform discharges does not rule out a clinical diagnosis of epilepsy.     Assessment:  57 year old AA male with unknown PMH who presented to [facility] s/p cardiac arrest. Patient was found down in asystole. ROSC was achieved after 7 rounds of Epi. Patient on exam is sedated with 7mg/kg/min of propofol and receiving fentanyl, and is in the maintenance phase of the cooling protocol. Some extensor posturing was noted overnight per nursing.  1. Toxicology screen was + for cocaine and tetrahydrocannabinol.  2. CT head 06/30/18 concerning for edema- question anoxic brain injury vs PRES. 3. 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   I have seen and examined the patient. I have amended the assessment and plan above, as well as portions of the documented neurological exam.  Electronically signed: Dr. EKerney Elbe7/04/2018, 9:54 AM

## 2018-07-01 NOTE — Progress Notes (Signed)
Called [facility] regarding low UOP. Order for 500cc bolus. Will continue to monitor patient closely.

## 2018-07-01 NOTE — Procedures (Signed)
ELECTROENCEPHALOGRAM REPORT  Date of Study: 07/01/2018  Patient's Name: Christopher KaufmannShelton M Coletta MRN: [id number] Date of Birth: 08/11/1961  Referring Provider: Dr. Warren LacyWalter Gray  Clinical History: This is a 57 year old man s/p cardiac arrest, on hypothermia protocol  Medications: Propofol  Technical Summary: A multichannel digital EEG recording measured by the international 10-20 system with electrodes applied with paste and impedances below 5000 ohms performed as portable with EKG monitoring in an intubated and sedated patient on hypothermia protocol.  Hyperventilation and photic stimulation were not performed.  The digital EEG was referentially recorded, reformatted, and digitally filtered in a variety of bipolar and referential montages for optimal display.   Description: The patient is intubated and sedated during the recording. There is no clear posterior dominant rhythm seen. The background shows a large amount of diffuse 5-6 Hz theta and occasional diffuse 2-3 Hz delta slowing with brief 1-second periods of diffuse background suppression. Normal sleep architecture was not seen. Hyperventilation and photic stimulation were not performed.  There were no epileptiform discharges or electrographic seizures seen.    EKG lead was unremarkable.  Impression: This sedated EEG is abnormal due to mild diffuse background slowing.  Clinical Correlation of the above findings indicates diffuse cerebral dysfunction that is non-specific in etiology and can be seen with hypoxic/ischemic injury, toxic/metabolic encephalopathies, or medication effect from Propofol.  There were no electrographic seizures in this study. The absence of epileptiform discharges does not rule out a clinical diagnosis of epilepsy.  Clinical correlation is advised.   Patrcia DollyKaren Fonnie Crookshanks, M.D.

## 2018-07-01 NOTE — Progress Notes (Signed)
Clarified with On Call MD regarding SQ Heparin being administered as ordered with concern for possible old GI bleed. Advised by MD that it was okay to proceed with SQ Heparin administration as ordered. At this time no signs of bleeding noted. No signs of upper/lower new/old GI bleed at this time. Will continue to monitor.

## 2018-07-01 NOTE — Consult Note (Addendum)
Cardiology Consultation:   Patient ID: Christopher Dominguez; [id number]; 03-31-1961   Admit date: 06/30/2018 Date of Consult: 07/01/2018  Primary Care Provider: Unknown Primary Cardiologist: Buford Dresser, MD   Chief Complaint: cardiac arrest  Patient Profile:   Christopher Dominguez is a 57 y.o. male with a hx of depression (on disability for mental health, prostate enlargement) who is being seen today for the evaluation of cardiac arrest at the request of Dr. Nelda Marseille.  History of Present Illness:   Christopher Dominguez is currently unconscious on the hypothermia protocol, so history is obtained through the chart and in talking with patient's girlfriend Estill Bamberg [phone]). Estill Bamberg will be reaching out to the patient's son to try and get medical history, and she will call up to the unit. She knows he is on disability for mental health and believes the only medication he has been taking was Flomax. PCCM note indicates patient may have been taking substantial NSAIDS but these were historical meds and girlfriend does not think he'd been taking. She says he had a physical last week and everything went OK. She states he does not drink or smoke and did not think he did drugs that she knew of, but UDS is + for cocaine and THC. She said he tends to keep his issues to himself but did keep saying he as under a lot of stress this week. He has been complaining of bilateral shoulder pain recently as well as some indigestion the day before this occurrence. Apparently they were sleeping yesterday morning - she is not clear on timing. She went to wake him and he was minimally arousable and had foam at the mouth. He was snoring but this is not out of the ordinary for him. She did say he was able to shake his head. She felt the foam may be related to his dentures being out and sleeping soundly so she wiped it away. His leg was already off the bed and he sort of slid onto the floor where she then tried to make him  comfortable to allow him to continue to rest. She did say he was breathing and had a pulse. However, she came back a short time later (again, she's unclear on timing) and he was no longer snoring and his pupils were dilated. She checked his pulse again and this time it was absent so she called EMS. Per ED notes, he was in asystole upon their arrival and received 7 amps of epi with ROSC with NSR. He was intubated for airway protection and placed on pressors for hypotension. Total CPR time was around 26 minutes but how long he had been pulseless before then is not really clear.  His evaluation shows evidence of multiorgan failure with acute renal failure (Cr peak 4.36 so far, was 1.2 in 2017), hyperkalemia with peak potassium of 6.4, shock liver with marked AST/ALT elevations (4019/3839), lactic acidosis with lactate of 12.79, leukocytosis, probable rhabdomyolsis with peak CK  5506, peak troponin 1.36, coagulopathy with INR of 2.15 (not on any home anticoagulation), procalcitonin 48. UDS is +THC, cocaine. There is concern for anoxic brain injury based on CT/EEG and lack of responsiveness prior to cooling without sedation agents. He remains on pressors and has been treated with IV fluids. There is also concern for GIB based on appearance of NG tube drainage - note today indicates blood appears old without evidence for new blood on lavage. 2D echo today (technically difficult) reveals EF 25-30% diffuse HK, grade 1  DD, mildly dilated aortic root, mildly dilated RV, severely reduced RV function.  Past Medical History:  Diagnosis Date  . Depression   . Prostate enlargement     History reviewed. No pertinent surgical history.   Inpatient Medications: Scheduled Meds: . artificial tears  1 application Both Eyes T0W  . chlorhexidine gluconate (MEDLINE KIT)  15 mL Mouth Rinse BID  . heparin  5,000 Units Subcutaneous Q8H  . mouth rinse  15 mL Mouth Rinse 10 times per day  . midazolam  2 mg Intravenous Once  .  pantoprazole sodium  40 mg Per Tube BID  . perflutren lipid microspheres (DEFINITY) IV suspension       Continuous Infusions: . sodium chloride 10 mL/hr at 07/01/18 1300  . sodium chloride Stopped (07/01/18 0931)  . cisatracurium (NIMBEX) infusion Stopped (07/01/18 0941)  . fentaNYL infusion INTRAVENOUS 250 mcg/hr (07/01/18 1300)  . norepinephrine (LEVOPHED) Adult infusion 22 mcg/min (07/01/18 1325)  . piperacillin-tazobactam (ZOSYN)  IV Stopped (07/01/18 1147)  . propofol (DIPRIVAN) infusion 30 mcg/kg/min (07/01/18 1300)  . vasopressin (PITRESSIN) infusion - *FOR SHOCK* 0.03 Units/min (07/01/18 1300)   PRN Meds: sodium chloride, fentaNYL, fentaNYL (SUBLIMAZE) injection  Home Meds: Prior to Admission medications   Medication Sig Start Date End Date Taking? Authorizing Provider  acetaminophen (TYLENOL) 500 MG tablet Take 1,000 mg by mouth every 6 (six) hours as needed for moderate pain.   Yes [provider]  ibuprofen (ADVIL,MOTRIN) 200 MG tablet Take 400 mg by mouth as needed for headache or mild pain.   Yes [provider]  tamsulosin (FLOMAX) 0.4 MG CAPS capsule Take 0.4 mg by mouth daily. 03/30/18  Yes [provider]  diclofenac (VOLTAREN) 75 MG EC tablet Take 1 tablet (75 mg total) by mouth 2 (two) times daily. Patient not taking: Reported on 06/30/2018 10/19/14   Waldemar Dickens, MD  diclofenac sodium (VOLTAREN) 1 % GEL Apply 2-4 g topically 4 (four) times daily. Patient not taking: Reported on 06/30/2018 10/19/14   Waldemar Dickens, MD  docusate sodium (COLACE) 100 MG capsule Take 1 capsule (100 mg total) by mouth every 12 (twelve) hours. Patient not taking: Reported on 06/30/2018 05/09/16   Ezequiel Essex, MD  naproxen (NAPROSYN) 500 MG tablet Take 1 tablet (500 mg total) by mouth 3 (three) times daily with meals. Patient not taking: Reported on 06/30/2018 08/13/15   Junius Creamer, NP    Allergies:   No Known Allergies  Social History:   Social History    Socioeconomic History  . Marital status: Single    Spouse name: Not on file  . Number of children: Not on file  . Years of education: Not on file  . Highest education level: Not on file  Occupational History  . Occupation: Disability for mental health  Social Needs  . Financial resource strain: Not on file  . Food insecurity:    Worry: Not on file    Inability: Not on file  . Transportation needs:    Medical: Not on file    Non-medical: Not on file  Tobacco Use  . Smoking status: Never Smoker  . Smokeless tobacco: Never Used  Substance and Sexual Activity  . Alcohol use: No  . Drug use: No  . Sexual activity: Not on file  Lifestyle  . Physical activity:    Days per week: Not on file    Minutes per session: Not on file  . Stress: Not on file  Relationships  . Social connections:  Talks on phone: Not on file    Gets together: Not on file    Attends religious service: Not on file    Active member of club or organization: Not on file    Attends meetings of clubs or organizations: Not on file    Relationship status: Not on file  . Intimate partner violence:    Fear of current or ex partner: Not on file    Emotionally abused: Not on file    Physically abused: Not on file    Forced sexual activity: Not on file  Other Topics Concern  . Not on file  Social History Narrative  . Not on file    Family History:   The patient's family history includes Dementia in his mother. Girlfriend is unaware of other family history  ROS:  Please see the history of present illness. Unable to obtain as patient is intubated and unconscious.  Physical Exam/Data:   Vitals:   07/01/18 1000 07/01/18 1100 07/01/18 1200 07/01/18 1300  BP: 119/73 127/74 130/65 123/67  Pulse: 73 70 81 78  Resp: 20 (!) 33 (!) 29 (!) 31  Temp: (!) 96.6 F (35.9 C) (!) 96.6 F (35.9 C)  98.2 F (36.8 C)  TempSrc:      SpO2: 100% 100% 94% 96%  Weight:      Height:        Intake/Output Summary (Last  24 hours) at 07/01/2018 1330 Last data filed at 07/01/2018 1300 Gross per 24 hour  Intake 5182.73 ml  Output 585 ml  Net 4597.73 ml   Filed Weights   06/30/18 1700 07/01/18 0410  Weight: 211 lb 13.8 oz (96.1 kg) 226 lb 3.1 oz (102.6 kg)   Body mass index is 31.55 kg/m.  General: Well developed, well nourished AAM in no acute distress. Intubated Head: Normocephalic, atraumatic, sclera non-icteric, no xanthomas, nares are without discharge.  Neck: JVD not elevated. Lungs: Clear bilaterally to auscultation without wheezes, rales, or rhonchi. Breathing is unlabored. Heart: RRR with S1 S2. No murmurs, rubs, or gallops appreciated. Abdomen: Soft, non-tender, non-distended with normoactive bowel sounds. No hepatomegaly. No rebound/guarding. No obvious abdominal masses. Msk:  Strength and tone appear normal for age. Extremities: No clubbing or cyanosis. No edema.  Diminished pedal pulses Neuro: Unresponsive on sedation for vent Psych:  Unable to assess  EKG:  The EKG was personally reviewed and demonstrates: 1) yesterday: NSR 73bpm, baseline IVCD, QTc prolonged at 548m with nonspecific ST changes 2) NSR 73bpm, nonspecific TWI V1-V3, QTc remains prolonged at 531m Relevant CV Studies: As above.  Laboratory Data:  Chemistry Recent Labs  Lab 06/30/18 1723 06/30/18 2344 07/01/18 0320  NA 141  137 137 138  K 6.4*  6.2* 4.1 3.8  CL 107  105 105 105  CO2 16* 14* 14*  GLUCOSE 101*  97 106* 110*  BUN 27*  35* 36* 37*  CREATININE 4.00*  3.80* 4.04* 4.36*  CALCIUM 6.8* 6.4* 7.2*  GFRNONAA 15* 15* 14*  GFRAA 18* 17* 16*  ANIONGAP 18* 18* 19*    Recent Labs  Lab 06/30/18 1405 07/01/18 0320  PROT 4.9* 4.1*  ALBUMIN 2.9* 2.5*  AST 2,690* 4,019*  ALT 3,795* 3,839*  ALKPHOS 70 35*  BILITOT 0.8 1.3*   Hematology Recent Labs  Lab 06/30/18 1405 06/30/18 1417 06/30/18 1723 07/01/18 0320  WBC 11.6*  --   --  4.5  RBC 6.21*  --   --  5.97*  HGB 13.8 16.3 14.6 13.2  HCT  50.5 48.0 43.0 42.9  MCV 81.3  --   --  71.9*  MCH 22.2*  --   --  22.1*  MCHC 27.3*  --   --  30.8  RDW 18.1*  --   --  16.8*  PLT 160  --   --  165   Cardiac Enzymes Recent Labs  Lab 06/30/18 1723 06/30/18 2344 07/01/18 0320 07/01/18 1012  TROPONINI 0.64* 1.12* 1.36* 1.18*    Recent Labs  Lab 06/30/18 1415  TROPIPOC 0.42*    BNPNo results for input(s): BNP, PROBNP in the last 168 hours.  DDimer No results for input(s): DDIMER in the last 168 hours.  Radiology/Studies:  Ct Abdomen Pelvis Wo Contrast  Result Date: 06/30/2018 CLINICAL DATA:  Patient unable to provide history. Out of hospital cardiac arrest. EXAM: CT ABDOMEN AND PELVIS WITHOUT CONTRAST TECHNIQUE: Multidetector CT imaging of the abdomen and pelvis was performed following the standard protocol without IV contrast. COMPARISON:  05/09/2016 FINDINGS: Lower chest: Hazy nodule opacification over the right middle lobe. Posterior bibasilar consolidation likely atelectasis right worse than left. No effusion. Hepatobiliary: Within normal. Pancreas: Normal. Spleen: Normal. Adrenals/Urinary Tract: Adrenal glands are normal. Kidneys are normal in size without hydronephrosis or nephrolithiasis. Ureters and bladder are unremarkable. Foley catheter is present within the bladder with a few small foci of air present. Stomach/Bowel: Nasogastric tube is present which is coiled once in the stomach with tip pointing posteriorly over the gastric fundus. Small bowel is normal. Appendix is normal. Colon is normal. Vascular/Lymphatic: Normal. Reproductive: Normal. Other: No free fluid or focal inflammatory change. Musculoskeletal: Mild spondylosis of the spine. IMPRESSION: No acute findings in the abdomen/pelvis. Subtle hazy nodular opacification over the right middle lobe which may be due to infection or an inflammatory process. Bibasilar consolidation likely atelectasis right worse than left. Nasogastric tube coiled once in the stomach with tip over  the gastric fundus directed posteriorly. Electronically Signed   By: Marin Olp M.D.   On: 06/30/2018 16:22   Ct Head Wo Contrast  Result Date: 07/01/2018 CLINICAL DATA:  57 year old male status post cardiac arrest. Questionable ischemia on prior CT EXAM: CT HEAD WITHOUT CONTRAST TECHNIQUE: Contiguous axial images were obtained from the base of the skull through the vertex without intravenous contrast. COMPARISON:  06/30/2018 FINDINGS: Brain: No acute intracranial hemorrhage. No midline shift or mass effect. Unremarkable appearance of the ventricles. Gray-white differentiation is maintained. No loss of the gray-white junction. Questionable findings in the right parietal and bilateral occipital regions likely represent artifact. Vascular: No significant atherosclerotic calcifications. Skull: No acute fracture.  No soft tissue swelling. Sinuses/Orbits: Unremarkable appearance of the orbits and sinuses. No mastoid effusion. Bilateral debris of the external auditory canal Other: None. IMPRESSION: CT demonstrates no acute intracranial abnormality. Previously noted findings of bilateral occipital and right parietal regions, may have represented artifact. Electronically Signed   By: Corrie Mckusick D.O.   On: 07/01/2018 12:56   Ct Head Wo Contrast  Result Date: 06/30/2018 CLINICAL DATA:  57 year old male with altered mental status. Cardiac arrest. EXAM: CT HEAD WITHOUT CONTRAST TECHNIQUE: Contiguous axial images were obtained from the base of the skull through the vertex without intravenous contrast. COMPARISON:  None. FINDINGS: Brain: Hypodensity within bilateral occipital lobes noted. There is equivocal cortical hypodensity/edema within the RIGHT frontoparietal region (series 3: Image 27). Question anoxic injury vs posterior reversible encephalopathy syndrome (PRES). No hemorrhage, extra-axial collection, hydrocephalus, midline shift or mass effect. Vascular: No hyperdense vessel or unexpected calcification.  Skull: Normal. Negative for fracture or focal lesion. Sinuses/Orbits: No acute finding. Other: Oral intubation tubes noted. IMPRESSION: 1. Bilateral occipital edema and equivocal RIGHT frontoparietal edema-question anoxic injury or posterior reversible encephalopathy syndrome. No hemorrhage or midline shift. Electronically Signed   By: Margarette Canada M.D.   On: 06/30/2018 14:51   Dg Chest Port 1 View  Result Date: 06/30/2018 CLINICAL DATA:  Central line placement. EXAM: PORTABLE CHEST 1 VIEW COMPARISON:  06/30/2018 at 1403 hours FINDINGS: New left subclavian central venous line has its tip in the mid superior vena cava, just inferior to the left brachiocephalic vein, superior vena cava confluence. No pneumothorax. New nasal/orogastric tube passes below the diaphragm into the stomach and below the included field of view. Endotracheal tube is stable, well-positioned. There are interstitial and intervening hazy airspace type opacities which are centrally distributed, most evident in the left perihilar region, accentuated by the supine technique. IMPRESSION: 1. New left subclavian central venous line tip projects in the mid superior vena cava. No pneumothorax. 2. New nasal/orogastric tube passes below the diaphragm into the stomach, below the included field of view. 3. Lung aeration is worsened. There are central interstitial and hazy airspace lung opacities. Findings are consistent with interstitial pulmonary edema versus bilateral infection. Electronically Signed   By: Lajean Manes M.D.   On: 06/30/2018 17:10   Dg Chest Portable 1 View  Result Date: 06/30/2018 CLINICAL DATA:  Cardiac arrest, CPR EXAM: PORTABLE CHEST 1 VIEW COMPARISON:  None. FINDINGS: Endotracheal tube is 2 cm above the carina. Heart is borderline in size. No confluent airspace opacities, effusions or edema. No pneumothorax. No visible acute bony abnormality. IMPRESSION: Endotracheal tube 2 cm above the carina. No acute cardiopulmonary disease.  Electronically Signed   By: Rolm Baptise M.D.   On: 06/30/2018 14:14   Dg Abd Portable 1v  Result Date: 06/30/2018 CLINICAL DATA:  Orogastric tube placement. EXAM: PORTABLE ABDOMEN - 1 VIEW COMPARISON:  None. FINDINGS: Orogastric tube extends well below the diaphragm to curl within the stomach. Normal bowel gas pattern. IMPRESSION: Well-positioned orogastric tube. Electronically Signed   By: Lajean Manes M.D.   On: 06/30/2018 15:16    Assessment and Plan:   1. Cardiac arrest/asystole - unclear total downtime as the patient was minimally responsive when girlfriend initially went to wake him, then went fully unresponsive sometime between the time she left and came back. CPR duration 26 minutes per chart. No STEMI on EKG. UDS is + cocaine. LE duplex pending. Doubt there is a role for any invasive cardiac workup at present time until his neurologic prognosis is elucidated or multiorgan failure shows signs of improvement.  2. Multiorgan failure with acute renal failure, shock liver, anoxic encephalopathy, lactic acidosis - prognosis guarded.  3. Elevated troponin - suspect due to rhabdo rather than primary ACS. Given downtime I am surprised it's actually not higher. No BB for now given + cocaine. Hold off statin given LFTs - can revisit prevention measures down the road contingent on recovery. There is concern for LGI bleed so would not use aspirin/heparin at this time.  4. Positive drug screen for cocaine - girlfriend previously unaware of use but states patient did not always reveal everything to her.  5. Biventricular ventricular failure - no acute evidence of hypervolemia on exam. Would exercise daily reassessment of need for IVF. Follow daily weights/strict I/O's. Not a candidate for standard HF meds due to hypotension but can re-eval on a daily basis. LV dysfunction is with unknown  chronicity, may be due to myocardial stunning.  6. Prolonged QT - no prior EKG available to discern if a chronic  findings. Suspect metabolically driven; will likely be perpetuated at least short term in setting of hypothermia. Initial rhythm by EMS was asystole rather than ventricular rhythm. Continue to follow on telemetry. K was 6.4 during day yesterday, now 3.6 - follow conservatively with Mg for now given his LV dysfunction. Will add Mg to labs. Would avoid addition of QT prolonging agents.  7. Shock, suspected cardiogenic - on pressors for now and maintaining SBP. WBC OK.  For questions or updates, please contact [place] Please consult [email or web address] for contact info under Cardiology/STEMI.    Signed, Charlie Pitter, PA-C  07/01/2018 1:30 PM

## 2018-07-01 NOTE — Progress Notes (Signed)
Received critical lactic acid value from lab of 3.5. This is actually lower than the previous one of 4.2. Will notify MD with rounds this am.

## 2018-07-01 NOTE — Progress Notes (Signed)
EEG complete, results pending 

## 2018-07-01 NOTE — Progress Notes (Signed)
eLink Physician-Brief Progress Note Patient Name: Christopher KaufmannShelton M Worthley DOB: 1961-12-28 MRN: [id number]   Date of Service  07/01/2018  HPI/Events of Note  Hypocalcemia  eICU Interventions  Calcium replaced        Crickett Abbett 07/01/2018, 1:18 AM

## 2018-07-01 NOTE — Progress Notes (Signed)
CRITICAL VALUE ALERT  Critical Value:  Lactic acid 4.2  Date & Time Notied:  07/01/18 4:42 AM   Provider Notified: Pola CornELink  Orders Received/Actions taken: No new orders.

## 2018-07-01 NOTE — Progress Notes (Signed)
Initial Nutrition Assessment  DOCUMENTATION CODES:   Not applicable  INTERVENTION:   If warranted recommend Vital AF 1.2 @ 75 ml/hr (1800 ml/day) Provides: 2160 kcal, 135 grams protein, and 1459 ml free water   NUTRITION DIAGNOSIS:   Inadequate oral intake related to inability to eat as evidenced by NPO status.  GOAL:   Patient will meet greater than or equal to 90% of their needs  MONITOR:   Vent status, I & O's  REASON FOR ASSESSMENT:   Ventilator    ASSESSMENT:   Pt with unknown PMH admitted after OOH arrest.    Pt paralyzed on 36 degree therapy. Concern for anoxic injury No plan for TF now.  Per RN she is weaning propofol off  Patient is currently intubated on ventilator support MV: 17.5 L/min Temp (24hrs), Avg:96.6 F (35.9 C), Min:92.5 F (33.6 C), Max:98.2 F (36.8 C)  Propofol: 17.3 ml/hr provides: 456 kcal Medications reviewed and include:  Levo @ 30 mcg  Vaso .03 units Labs reviewed: AST/ALT: 4019/3839    NUTRITION - FOCUSED PHYSICAL EXAM:    Most Recent Value  Orbital Region  No depletion  Upper Arm Region  No depletion  Thoracic and Lumbar Region  No depletion  Buccal Region  Unable to assess  Temple Region  No depletion  Clavicle Bone Region  No depletion  Clavicle and Acromion Bone Region  No depletion  Scapular Bone Region  Unable to assess  Dorsal Hand  No depletion  Patellar Region  No depletion  Anterior Thigh Region  No depletion  Posterior Calf Region  No depletion  Edema (RD Assessment)  None  Hair  Reviewed  Eyes  Unable to assess  Mouth  Unable to assess  Skin  Reviewed  Nails  Reviewed       Diet Order:   Diet Order           Diet NPO time specified  Diet effective now          EDUCATION NEEDS:   No education needs have been identified at this time  Skin:  Skin Assessment: Reviewed RN Assessment  Last BM:  7/4 - rectal tube   Height:   Ht Readings from Last 1 Encounters:  06/30/18 5\' 11"  (1.803 m)     Weight:   Wt Readings from Last 1 Encounters:  07/01/18 226 lb 3.1 oz (102.6 kg)    Ideal Body Weight:  78.1 kg  BMI:  Body mass index is 31.55 kg/m.  Estimated Nutritional Needs:   Kcal:  2210  Protein:  115-145 grams  Fluid:  2 L/day  Kendell BaneHeather Kilynn Fitzsimmons RD, LDN, CNSC [phone] Pager [phone] After Hours Pager

## 2018-07-01 NOTE — Progress Notes (Signed)
RT transported patient to CT and returned to 2H05 without complications. Vitals stable throughout. Patient tolerated well. RT will continue to monitor.

## 2018-07-02 ENCOUNTER — Inpatient Hospital Stay (HOSPITAL_COMMUNITY): Payer: Medicare Other

## 2018-07-02 DIAGNOSIS — G931 Anoxic brain damage, not elsewhere classified: Secondary | ICD-10-CM

## 2018-07-02 DIAGNOSIS — J9601 Acute respiratory failure with hypoxia: Secondary | ICD-10-CM

## 2018-07-02 LAB — POCT I-STAT 3, ART BLOOD GAS (G3+)
Acid-base deficit: 9 mmol/L — ABNORMAL HIGH (ref 0.0–2.0)
Bicarbonate: 17.4 mmol/L — ABNORMAL LOW (ref 20.0–28.0)
O2 Saturation: 99 %
PCO2 ART: 37.7 mmHg (ref 32.0–48.0)
PH ART: 7.272 — AB (ref 7.350–7.450)
TCO2: 18 mmol/L — AB (ref 22–32)
pO2, Arterial: 135 mmHg — ABNORMAL HIGH (ref 83.0–108.0)

## 2018-07-02 LAB — COOXEMETRY PANEL
CARBOXYHEMOGLOBIN: 1.2 % (ref 0.5–1.5)
METHEMOGLOBIN: 1 % (ref 0.0–1.5)
O2 SAT: 85 %
TOTAL HEMOGLOBIN: 11.9 g/dL — AB (ref 12.0–16.0)

## 2018-07-02 LAB — RENAL FUNCTION PANEL
ALBUMIN: 2.2 g/dL — AB (ref 3.5–5.0)
Anion gap: 18 — ABNORMAL HIGH (ref 5–15)
BUN: 60 mg/dL — AB (ref 6–20)
CO2: 21 mmol/L — AB (ref 22–32)
Calcium: 6.6 mg/dL — ABNORMAL LOW (ref 8.9–10.3)
Chloride: 102 mmol/L (ref 98–111)
Creatinine, Ser: 7.29 mg/dL — ABNORMAL HIGH (ref 0.61–1.24)
GFR calc Af Amer: 9 mL/min — ABNORMAL LOW (ref >60)
GFR calc non Af Amer: 7 mL/min — ABNORMAL LOW (ref >60)
GLUCOSE: 94 mg/dL (ref 70–99)
Phosphorus: 6.8 mg/dL — ABNORMAL HIGH (ref 2.5–4.6)
Potassium: 4.6 mmol/L (ref 3.5–5.1)
SODIUM: 141 mmol/L (ref 135–145)

## 2018-07-02 LAB — CBC
HCT: 40.7 % (ref 39.0–52.0)
Hemoglobin: 12.3 g/dL — ABNORMAL LOW (ref 13.0–17.0)
MCH: 22 pg — AB (ref 26.0–34.0)
MCHC: 30.2 g/dL (ref 30.0–36.0)
MCV: 72.8 fL — AB (ref 78.0–100.0)
Platelets: 142 10*3/uL — ABNORMAL LOW (ref 150–400)
RBC: 5.59 MIL/uL (ref 4.22–5.81)
RDW: 17.7 % — AB (ref 11.5–15.5)
WBC: 7.3 10*3/uL (ref 4.0–10.5)

## 2018-07-02 LAB — GLUCOSE, CAPILLARY
GLUCOSE-CAPILLARY: 81 mg/dL (ref 70–99)
Glucose-Capillary: 75 mg/dL (ref 70–99)
Glucose-Capillary: 81 mg/dL (ref 70–99)
Glucose-Capillary: 87 mg/dL (ref 70–99)
Glucose-Capillary: 91 mg/dL (ref 70–99)
Glucose-Capillary: 94 mg/dL (ref 70–99)

## 2018-07-02 LAB — BASIC METABOLIC PANEL
ANION GAP: 16 — AB (ref 5–15)
BUN: 55 mg/dL — AB (ref 6–20)
CALCIUM: 6.4 mg/dL — AB (ref 8.9–10.3)
CO2: 17 mmol/L — AB (ref 22–32)
CREATININE: 6.66 mg/dL — AB (ref 0.61–1.24)
Chloride: 104 mmol/L (ref 98–111)
GFR calc Af Amer: 9 mL/min — ABNORMAL LOW (ref >60)
GFR, EST NON AFRICAN AMERICAN: 8 mL/min — AB (ref >60)
GLUCOSE: 84 mg/dL (ref 70–99)
Potassium: 6.1 mmol/L — ABNORMAL HIGH (ref 3.5–5.1)
Sodium: 137 mmol/L (ref 135–145)

## 2018-07-02 LAB — MAGNESIUM
MAGNESIUM: 1.7 mg/dL (ref 1.7–2.4)
Magnesium: 1.7 mg/dL (ref 1.7–2.4)
Magnesium: 1.9 mg/dL (ref 1.7–2.4)

## 2018-07-02 LAB — PHOSPHORUS
Phosphorus: 7.1 mg/dL — ABNORMAL HIGH (ref 2.5–4.6)
Phosphorus: 7.2 mg/dL — ABNORMAL HIGH (ref 2.5–4.6)
Phosphorus: 6.6 mg/dL — ABNORMAL HIGH (ref 2.5–4.6)

## 2018-07-02 LAB — PROCALCITONIN: PROCALCITONIN: 60.76 ng/mL

## 2018-07-02 LAB — TRIGLYCERIDES: TRIGLYCERIDES: 303 mg/dL — AB (ref <150)

## 2018-07-02 LAB — LACTIC ACID, PLASMA: LACTIC ACID, VENOUS: 1.7 mmol/L (ref 0.5–1.9)

## 2018-07-02 MED ORDER — HEPARIN BOLUS VIA INFUSION (CRRT)
1000.0000 [IU] | INTRAVENOUS | Status: DC | PRN
  Filled 2018-07-02: qty 1000

## 2018-07-02 MED ORDER — SODIUM CHLORIDE 0.9% FLUSH
10.0000 mL | Freq: Two times a day (BID) | INTRAVENOUS | Status: DC
  Administered 2018-07-02 – 2018-07-04 (×4): 10 mL
  Administered 2018-07-04: 20 mL
  Administered 2018-07-05 – 2018-07-06 (×4): 10 mL
  Administered 2018-07-07: 30 mL
  Administered 2018-07-07 – 2018-07-08 (×2): 10 mL
  Administered 2018-07-08: 30 mL
  Administered 2018-07-09: 20 mL
  Administered 2018-07-09: 10 mL

## 2018-07-02 MED ORDER — PANTOPRAZOLE SODIUM 40 MG PO PACK
40.0000 mg | PACK | Freq: Every day | ORAL | Status: DC
  Administered 2018-07-02 – 2018-07-06 (×5): 40 mg
  Filled 2018-07-02 (×5): qty 20

## 2018-07-02 MED ORDER — CHLORHEXIDINE GLUCONATE CLOTH 2 % EX PADS
6.0000 | MEDICATED_PAD | Freq: Every day | CUTANEOUS | Status: DC
  Administered 2018-07-03 – 2018-07-04 (×2): 6 via TOPICAL

## 2018-07-02 MED ORDER — VITAL AF 1.2 CAL PO LIQD
1000.0000 mL | ORAL | Status: DC
  Administered 2018-07-02 – 2018-07-03 (×2): 1000 mL

## 2018-07-02 MED ORDER — SODIUM CHLORIDE 0.9 % IJ SOLN
250.0000 [IU]/h | INTRAMUSCULAR | Status: DC
  Administered 2018-07-02: 250 [IU]/h via INTRAVENOUS_CENTRAL
  Administered 2018-07-03: 750 [IU]/h via INTRAVENOUS_CENTRAL
  Administered 2018-07-03: 850 [IU]/h via INTRAVENOUS_CENTRAL
  Administered 2018-07-04 (×2): 900 [IU]/h via INTRAVENOUS_CENTRAL
  Administered 2018-07-05: 1400 [IU]/h via INTRAVENOUS_CENTRAL
  Administered 2018-07-05: 1100 [IU]/h via INTRAVENOUS_CENTRAL
  Administered 2018-07-06 – 2018-07-07 (×6): 1400 [IU]/h via INTRAVENOUS_CENTRAL
  Administered 2018-07-07: 1350 [IU]/h via INTRAVENOUS_CENTRAL
  Administered 2018-07-08: 950 [IU]/h via INTRAVENOUS_CENTRAL
  Filled 2018-07-02 (×16): qty 2

## 2018-07-02 MED ORDER — SODIUM CHLORIDE 0.9% FLUSH
10.0000 mL | INTRAVENOUS | Status: DC | PRN
  Administered 2018-07-04: 10 mL
  Filled 2018-07-02: qty 40

## 2018-07-02 MED ORDER — HEPARIN SODIUM (PORCINE) 1000 UNIT/ML DIALYSIS
1000.0000 [IU] | INTRAMUSCULAR | Status: DC | PRN
  Administered 2018-07-02: 2400 [IU] via INTRAVENOUS_CENTRAL
  Filled 2018-07-02: qty 3
  Filled 2018-07-02 (×2): qty 6

## 2018-07-02 MED ORDER — PIPERACILLIN-TAZOBACTAM IN DEX 2-0.25 GM/50ML IV SOLN
2.2500 g | Freq: Four times a day (QID) | INTRAVENOUS | Status: DC
  Administered 2018-07-02: 2.25 g via INTRAVENOUS
  Filled 2018-07-02 (×2): qty 50

## 2018-07-02 MED ORDER — VITAL HIGH PROTEIN PO LIQD
1000.0000 mL | ORAL | Status: DC
  Administered 2018-07-02: 1000 mL

## 2018-07-02 MED ORDER — HEPARIN (PORCINE) 2000 UNITS/L FOR CRRT
INTRAVENOUS_CENTRAL | Status: DC | PRN
  Administered 2018-07-02: 1000 mL via INTRAVENOUS_CENTRAL
  Administered 2018-07-04 – 2018-07-05 (×2): via INTRAVENOUS_CENTRAL
  Administered 2018-07-06: 1000 mL via INTRAVENOUS_CENTRAL
  Filled 2018-07-02 (×2): qty 1000

## 2018-07-02 MED ORDER — PRO-STAT SUGAR FREE PO LIQD
30.0000 mL | Freq: Two times a day (BID) | ORAL | Status: DC
  Administered 2018-07-02: 30 mL
  Filled 2018-07-02: qty 30

## 2018-07-02 MED ORDER — PRISMASOL BGK 0/2.5 32-2.5 MEQ/L IV SOLN
INTRAVENOUS | Status: DC
  Administered 2018-07-02 – 2018-07-04 (×5): via INTRAVENOUS_CENTRAL
  Filled 2018-07-02 (×7): qty 5000

## 2018-07-02 MED ORDER — PRISMASOL BGK 4/2.5 32-4-2.5 MEQ/L IV SOLN
INTRAVENOUS | Status: DC
  Administered 2018-07-02 – 2018-07-08 (×46): via INTRAVENOUS_CENTRAL
  Filled 2018-07-02 (×64): qty 5000

## 2018-07-02 MED ORDER — HEPARIN SODIUM (PORCINE) 1000 UNIT/ML DIALYSIS
1000.0000 [IU] | INTRAMUSCULAR | Status: DC | PRN
  Administered 2018-07-08: 2400 [IU] via INTRAVENOUS_CENTRAL
  Filled 2018-07-02 (×2): qty 6
  Filled 2018-07-02: qty 3

## 2018-07-02 MED ORDER — PRISMASOL BGK 0/2.5 32-2.5 MEQ/L IV SOLN
INTRAVENOUS | Status: DC
  Administered 2018-07-02 – 2018-07-03 (×2): via INTRAVENOUS_CENTRAL
  Filled 2018-07-02 (×4): qty 5000

## 2018-07-02 MED ORDER — FENTANYL CITRATE (PF) 100 MCG/2ML IJ SOLN
25.0000 ug | INTRAMUSCULAR | Status: DC | PRN
  Administered 2018-07-03 – 2018-07-05 (×5): 100 ug via INTRAVENOUS
  Administered 2018-07-05: 50 ug via INTRAVENOUS
  Administered 2018-07-05 – 2018-07-06 (×2): 100 ug via INTRAVENOUS
  Filled 2018-07-02 (×8): qty 2

## 2018-07-02 MED ORDER — PIPERACILLIN-TAZOBACTAM 3.375 G IVPB 30 MIN
3.3750 g | Freq: Four times a day (QID) | INTRAVENOUS | Status: DC
  Administered 2018-07-02 – 2018-07-06 (×15): 3.375 g via INTRAVENOUS
  Filled 2018-07-02 (×17): qty 50

## 2018-07-02 MED ORDER — PIPERACILLIN-TAZOBACTAM 3.375 G IVPB: 3.3750 g | Freq: Four times a day (QID) | INTRAVENOUS | Status: DC

## 2018-07-02 MED ORDER — FENTANYL CITRATE (PF) 100 MCG/2ML IJ SOLN
INTRAMUSCULAR | Status: AC
  Filled 2018-07-02: qty 2

## 2018-07-02 NOTE — Progress Notes (Signed)
On call MD notified and updated regarding patients AM ABG results: ph: 7.27, PCO2 37.7, PO2 135, HCO3 17.4 with BE, B of -9. Advised to maintain vent settings without adjustments. No Bicarb to be administered at this time.  No further orders at this time.

## 2018-07-02 NOTE — Procedures (Signed)
Hemodialysis Catheter Insertion Procedure Note Christopher KaufmannShelton M Dominguez [id number] [birth year]  Procedure: Insertion of Central Venous Catheter Indications: Acute kidney injury  Procedure Details Consent: Risks of procedure as well as the alternatives and risks of each were explained to the (patient/caregiver).  Consent for procedure obtained. Time Out: Verified patient identification, verified procedure, site/side was marked, verified correct patient position, special equipment/implants available, medications/allergies/relevent history reviewed, required imaging and test results available.  Performed  Maximum sterile technique was used including antiseptics, cap, gloves, gown, hand hygiene, mask and sheet. Skin prep: Chlorhexidine; local anesthetic administered A antimicrobial bonded/coated triple lumen catheter was placed in the right internal jugular vein using the Seldinger technique.  Ultrasound was used to verify the patency of the vein and for real time needle guidance.  Evaluation Blood flow good Complications: No apparent complications Patient did tolerate procedure well. Chest X-ray ordered to verify placement.  CXR: pending.  Christopher Dominguez Christopher Dominguez 07/02/2018, 11:07 AM

## 2018-07-02 NOTE — Progress Notes (Signed)
Nutrition Follow-up / Consult  DOCUMENTATION CODES:   Not applicable  INTERVENTION:    Vital AF 1.2 at 75 ml/h (1800 ml per day)   Provides 2160 kcal, 135 gm protein, 1459 ml free water daily  NUTRITION DIAGNOSIS:   Inadequate oral intake related to inability to eat as evidenced by NPO status.  Ongoing  GOAL:   Patient will meet greater than or equal to 90% of their needs  Being addressed with TF  MONITOR:   Vent status, I & O's  REASON FOR ASSESSMENT:   Consult Enteral/tube feeding initiation and management  ASSESSMENT:   Pt with unknown PMH admitted after OOH arrest.   Received MD Consult for TF initiation and management. OGT in place. Remains on normotherapy protocol.  Patient remains intubated on ventilator support MV: 12.4 L/min Temp (24hrs), Avg:98.2 F (36.8 C), Min:97 F (36.1 C), Max:98.8 F (37.1 C)  Propofol: off  Labs reviewed. Phosphorus 7.2 (H), potassium 6.1 (H), triglycerides 303 (H) Medications reviewed and include Nimbex.    Diet Order:   Diet Order           Diet NPO time specified  Diet effective now          EDUCATION NEEDS:   No education needs have been identified at this time  Skin:  Skin Assessment: Reviewed RN Assessment  Last BM:  7/6 - rectal tube  Height:   Ht Readings from Last 1 Encounters:  07/02/18 5\' 11"  (1.803 m)    Weight:   Wt Readings from Last 1 Encounters:  07/02/18 225 lb 5 oz (102.2 kg)    Ideal Body Weight:  78.1 kg  BMI:  Body mass index is 31.42 kg/m.  Estimated Nutritional Needs:   Kcal:  2210  Protein:  115-145 grams  Fluid:  2 L/day    Joaquin CourtsKimberly Harris, RD, LDN, CNSC Pager [phone] After Hours Pager [phone]

## 2018-07-02 NOTE — Progress Notes (Signed)
CRITICAL VALUE ALERT  Critical Value:  Calcium 6.4  Date & Time Notied:  07/02/2018 0554  Critical Result Report Handed Off To: Markus DaftGretchin Whelan, RN.    E-Link: Donny PiqueGrechin Whelan, RN to Report to Dr. Shelba FlakeElizabeth Deterding, MD.   Orders Received/Actions taken: No new orders received at this time.

## 2018-07-02 NOTE — Progress Notes (Signed)
20 CC Fentanyl wasted with Gillis EndsMillie Shaw, RN

## 2018-07-02 NOTE — Progress Notes (Signed)
Progress Note  Patient Name: Christopher Dominguez Date of Encounter: 07/02/2018  Primary Cardiologist: Buford Dresser, MD   Subjective   Intubated/no history   Inpatient Medications    Scheduled Meds: . chlorhexidine gluconate (MEDLINE KIT)  15 mL Mouth Rinse BID  . feeding supplement (PRO-STAT SUGAR FREE 64)  30 mL Per Tube BID  . feeding supplement (VITAL HIGH PROTEIN)  1,000 mL Per Tube Q24H  . heparin  5,000 Units Subcutaneous Q8H  . mouth rinse  15 mL Mouth Rinse 10 times per day  . pantoprazole sodium  40 mg Per Tube Daily   Continuous Infusions: . sodium chloride 10 mL/hr at 07/02/18 0900  . sodium chloride 75 mL/hr at 07/02/18 0900  . cisatracurium (NIMBEX) infusion Stopped (07/01/18 0941)  . norepinephrine (LEVOPHED) Adult infusion 2 mcg/min (07/02/18 0900)  . piperacillin-tazobactam (ZOSYN)  IV    . vasopressin (PITRESSIN) infusion - *FOR SHOCK* Stopped (07/02/18 6384)   PRN Meds: sodium chloride, fentaNYL (SUBLIMAZE) injection, heparin   Vital Signs    Vitals:   07/02/18 0900 07/02/18 1000 07/02/18 1120 07/02/18 1133  BP: 110/66 116/67  (!) 112/59  Pulse: 80 82  83  Resp: (!) 22 (!) 22  (!) 22  Temp: 98.8 F (37.1 C) 98.4 F (36.9 C) 98.2 F (36.8 C) 98.4 F (36.9 C)  TempSrc:      SpO2: 99% 98%  99%  Weight:      Height:        Intake/Output Summary (Last 24 hours) at 07/02/2018 1137 Last data filed at 07/02/2018 0900 Gross per 24 hour  Intake 2477.53 ml  Output 842 ml  Net 1635.53 ml   Filed Weights   06/30/18 1700 07/01/18 0410 07/02/18 0500  Weight: 211 lb 13.8 oz (96.1 kg) 226 lb 3.1 oz (102.6 kg) 225 lb 5 oz (102.2 kg)    Telemetry    Normal sinus rhythm and sinus tachycardia- Personally Reviewed  ECG    Sinus rhythm with no acute ST-T wave changes noted on tracing done 07/01/2018- Personally Reviewed  Physical Exam  Intubated/not responsive GEN:   On ventilator Neck:   Unable to assess JVD Cardiac: no murmurs, rubs, or  gallops.  Respiratory: Clear to auscultation bilaterally. GI: Soft, nontender, non-distended  MS: No edema; No deformity. Neuro:    Unresponsive   Labs    Chemistry Recent Labs  Lab 06/30/18 1405  06/30/18 2344 07/01/18 0320 07/02/18 0437  NA 140   < > 137 138 137  K 4.5   < > 4.1 3.8 6.1*  CL 99   < > 105 105 104  CO2 17*   < > 14* 14* 17*  GLUCOSE 148*   < > 106* 110* 84  BUN 25*   < > 36* 37* 55*  CREATININE 4.14*   < > 4.04* 4.36* 6.66*  CALCIUM 8.0*   < > 6.4* 7.2* 6.4*  PROT 4.9*  --   --  4.1*  --   ALBUMIN 2.9*  --   --  2.5*  --   AST 2,690*  --   --  4,019*  --   ALT 3,795*  --   --  3,839*  --   ALKPHOS 70  --   --  35*  --   BILITOT 0.8  --   --  1.3*  --   GFRNONAA 15*   < > 15* 14* 8*  GFRAA 17*   < > 17* 16* 9*  ANIONGAP  24*   < > 18* 19* 16*   < > = values in this interval not displayed.     Hematology Recent Labs  Lab 06/30/18 1405  06/30/18 1723 07/01/18 0320 07/02/18 0437  WBC 11.6*  --   --  4.5 7.3  RBC 6.21*  --   --  5.97* 5.59  HGB 13.8   < > 14.6 13.2 12.3*  HCT 50.5   < > 43.0 42.9 40.7  MCV 81.3  --   --  71.9* 72.8*  MCH 22.2*  --   --  22.1* 22.0*  MCHC 27.3*  --   --  30.8 30.2  RDW 18.1*  --   --  16.8* 17.7*  PLT 160  --   --  165 142*   < > = values in this interval not displayed.    Cardiac Enzymes Recent Labs  Lab 06/30/18 1723 06/30/18 2344 07/01/18 0320 07/01/18 1012  TROPONINI 0.64* 1.12* 1.36* 1.18*    Recent Labs  Lab 06/30/18 1415  TROPIPOC 0.42*     BNPNo results for input(s): BNP, PROBNP in the last 168 hours.   DDimer No results for input(s): DDIMER in the last 168 hours.   Radiology    Ct Abdomen Pelvis Wo Contrast  Result Date: 06/30/2018 CLINICAL DATA:  Patient unable to provide history. Out of hospital cardiac arrest. EXAM: CT ABDOMEN AND PELVIS WITHOUT CONTRAST TECHNIQUE: Multidetector CT imaging of the abdomen and pelvis was performed following the standard protocol without IV contrast.  COMPARISON:  05/09/2016 FINDINGS: Lower chest: Hazy nodule opacification over the right middle lobe. Posterior bibasilar consolidation likely atelectasis right worse than left. No effusion. Hepatobiliary: Within normal. Pancreas: Normal. Spleen: Normal. Adrenals/Urinary Tract: Adrenal glands are normal. Kidneys are normal in size without hydronephrosis or nephrolithiasis. Ureters and bladder are unremarkable. Foley catheter is present within the bladder with a few small foci of air present. Stomach/Bowel: Nasogastric tube is present which is coiled once in the stomach with tip pointing posteriorly over the gastric fundus. Small bowel is normal. Appendix is normal. Colon is normal. Vascular/Lymphatic: Normal. Reproductive: Normal. Other: No free fluid or focal inflammatory change. Musculoskeletal: Mild spondylosis of the spine. IMPRESSION: No acute findings in the abdomen/pelvis. Subtle hazy nodular opacification over the right middle lobe which may be due to infection or an inflammatory process. Bibasilar consolidation likely atelectasis right worse than left. Nasogastric tube coiled once in the stomach with tip over the gastric fundus directed posteriorly. Electronically Signed   By: Marin Olp M.D.   On: 06/30/2018 16:22   Ct Head Wo Contrast  Result Date: 07/01/2018 CLINICAL DATA:  57 year old male status post cardiac arrest. Questionable ischemia on prior CT EXAM: CT HEAD WITHOUT CONTRAST TECHNIQUE: Contiguous axial images were obtained from the base of the skull through the vertex without intravenous contrast. COMPARISON:  06/30/2018 FINDINGS: Brain: No acute intracranial hemorrhage. No midline shift or mass effect. Unremarkable appearance of the ventricles. Gray-white differentiation is maintained. No loss of the gray-white junction. Questionable findings in the right parietal and bilateral occipital regions likely represent artifact. Vascular: No significant atherosclerotic calcifications. Skull: No  acute fracture.  No soft tissue swelling. Sinuses/Orbits: Unremarkable appearance of the orbits and sinuses. No mastoid effusion. Bilateral debris of the external auditory canal Other: None. IMPRESSION: CT demonstrates no acute intracranial abnormality. Previously noted findings of bilateral occipital and right parietal regions, may have represented artifact. Electronically Signed   By: Corrie Mckusick D.O.   On: 07/01/2018 12:56  Ct Head Wo Contrast  Result Date: 06/30/2018 CLINICAL DATA:  57 year old male with altered mental status. Cardiac arrest. EXAM: CT HEAD WITHOUT CONTRAST TECHNIQUE: Contiguous axial images were obtained from the base of the skull through the vertex without intravenous contrast. COMPARISON:  None. FINDINGS: Brain: Hypodensity within bilateral occipital lobes noted. There is equivocal cortical hypodensity/edema within the RIGHT frontoparietal region (series 3: Image 27). Question anoxic injury vs posterior reversible encephalopathy syndrome (PRES). No hemorrhage, extra-axial collection, hydrocephalus, midline shift or mass effect. Vascular: No hyperdense vessel or unexpected calcification. Skull: Normal. Negative for fracture or focal lesion. Sinuses/Orbits: No acute finding. Other: Oral intubation tubes noted. IMPRESSION: 1. Bilateral occipital edema and equivocal RIGHT frontoparietal edema-question anoxic injury or posterior reversible encephalopathy syndrome. No hemorrhage or midline shift. Electronically Signed   By: Margarette Canada M.D.   On: 06/30/2018 14:51   Dg Chest Port 1 View  Result Date: 07/02/2018 CLINICAL DATA:  History of ETT. EXAM: PORTABLE CHEST 1 VIEW COMPARISON:  June 30, 2018 FINDINGS: The ETT and left central line are in good position. The OG tube is not seen in its entirety. The cardiomediastinal silhouette is stable. No pneumothorax. No nodules or masses. Probable small effusion with underlying atelectasis. No overt edema. No other nodule, mass, or infiltrate.  IMPRESSION: 1. Support apparatus as above. 2. Probable small left effusion with underlying atelectasis. 3. No other acute abnormality identified. Electronically Signed   By: Dorise Bullion III M.D   On: 07/02/2018 07:07   Dg Chest Port 1 View  Result Date: 06/30/2018 CLINICAL DATA:  Central line placement. EXAM: PORTABLE CHEST 1 VIEW COMPARISON:  06/30/2018 at 1403 hours FINDINGS: New left subclavian central venous line has its tip in the mid superior vena cava, just inferior to the left brachiocephalic vein, superior vena cava confluence. No pneumothorax. New nasal/orogastric tube passes below the diaphragm into the stomach and below the included field of view. Endotracheal tube is stable, well-positioned. There are interstitial and intervening hazy airspace type opacities which are centrally distributed, most evident in the left perihilar region, accentuated by the supine technique. IMPRESSION: 1. New left subclavian central venous line tip projects in the mid superior vena cava. No pneumothorax. 2. New nasal/orogastric tube passes below the diaphragm into the stomach, below the included field of view. 3. Lung aeration is worsened. There are central interstitial and hazy airspace lung opacities. Findings are consistent with interstitial pulmonary edema versus bilateral infection. Electronically Signed   By: Lajean Manes M.D.   On: 06/30/2018 17:10   Dg Chest Portable 1 View  Result Date: 06/30/2018 CLINICAL DATA:  Cardiac arrest, CPR EXAM: PORTABLE CHEST 1 VIEW COMPARISON:  None. FINDINGS: Endotracheal tube is 2 cm above the carina. Heart is borderline in size. No confluent airspace opacities, effusions or edema. No pneumothorax. No visible acute bony abnormality. IMPRESSION: Endotracheal tube 2 cm above the carina. No acute cardiopulmonary disease. Electronically Signed   By: Rolm Baptise M.D.   On: 06/30/2018 14:14   Dg Abd Portable 1v  Result Date: 06/30/2018 CLINICAL DATA:  Orogastric tube  placement. EXAM: PORTABLE ABDOMEN - 1 VIEW COMPARISON:  None. FINDINGS: Orogastric tube extends well below the diaphragm to curl within the stomach. Normal bowel gas pattern. IMPRESSION: Well-positioned orogastric tube. Electronically Signed   By: Lajean Manes M.D.   On: 06/30/2018 15:16    Cardiac Studies   2D Doppler echocardiogram 519: Study Conclusions  - Left ventricle: The cavity size was normal. Wall thickness was   increased  in a pattern of mild LVH. Systolic function was   severely reduced. The estimated ejection fraction was in the   range of 25% to 30%. Diffuse hypokinesis. Doppler parameters are   consistent with abnormal left ventricular relaxation (grade 1   diastolic dysfunction). - Aortic root: The aortic root was mildly dilated. - Mitral valve: Calcified annulus. - Right ventricle: The cavity size was mildly dilated. Systolic   function was severely reduced. - Right atrium: The atrium was mildly dilated.  Impressions:  - Technically difficult; definity used; severe LV dysfunction; mild   LVH; mild diastolic dysfunction; enlarged right side with   moderate to severe RV dysfunction.  Patient Profile     57 y.o. male male with a hx of depression (on disability for mental health, prostate enlargement) who is being seen today for the evaluation of cardiac arrest at the request of Dr. Nelda Marseille.  Multiorgan failure syndrome and LVEF 25 to 30%.    Assessment & Plan    1. Acute systolic heart failure, presumed.  Likely secondary to prolonged resuscitation and low flow.  No evidence of Q wave infarction.  Given troponin bump.  The elevated troponin is felt related to demand in setting of hypotension and cardiac arrest.  LV function may recover if can remain hemodynamically stable.  CNS status is unknown 2. Elevated troponin is noted.  Not likely related to ACS. 3. Acute and progressive kidney failure with creatinine 6.6.  [facility] will sign off.   Medication  Recommendations:  None Other recommendations (labs, testing, etc):  none Follow up as an outpatient:  N/A  Please reconsult if we may be of assistance.  For questions or updates, please contact [facility] [place] Please consult [email or web address] for contact info under Cardiology/STEMI.      Signed, Sinclair Grooms, MD  07/02/2018, 11:37 AM

## 2018-07-02 NOTE — Consult Note (Signed)
Reason for Consult: ARF Referring Physician:  Lake Bells, MD  ORLANDUS BOROWSKI is an 57 y.o. male.  HPI: Pt is a 58yo AAM with PMH significant for depression who suffered a cardiac arrest in the field.  No known history regarding the even except that he was found down in asystole.  CPR was initiated and return of circulation after 7 rounds of epinephrine.  Unclear how long he was down prior to extended resuscitation time now with multi-organ failure.  We were consulted to further evaluate and manage his ARF.  The trend in Scr is seen below.  His tox screen was + for cocaine, and ECHO revealed diffuse hypokinesis, severe reduction in RV function and EF 25-30%.  CT scan revealed bilateral occipital edema and questionable anoxic injury or possible reversible encephalopathy syndrome.  PCCM is not sure if his ARF is contributing to his encephalopathy or if this is all anoxic brain injury so we were also consulted to provide short term CVVHD to see if his cognitive state improves with improved clearance of uremic solutes.    Trend in Creatinine: Creatinine, Ser  Date/Time Value Ref Range Status  07/02/2018 04:37 AM 6.66 (H) 0.61 - 1.24 mg/dL Final  07/01/2018 03:20 AM 4.36 (H) 0.61 - 1.24 mg/dL Final  06/30/2018 11:44 PM 4.04 (H) 0.61 - 1.24 mg/dL Final  06/30/2018 05:23 PM 4.00 (H) 0.61 - 1.24 mg/dL Final  06/30/2018 05:23 PM 3.80 (H) 0.61 - 1.24 mg/dL Final  06/30/2018 02:17 PM 3.60 (H) 0.61 - 1.24 mg/dL Final  06/30/2018 02:05 PM 4.14 (H) 0.61 - 1.24 mg/dL Final  05/09/2016 01:01 PM 1.23 0.61 - 1.24 mg/dL Final  07/19/2014 11:22 AM 1.05 0.50 - 1.35 mg/dL Final    PMH:   Past Medical History:  Diagnosis Date  . Depression   . Prostate enlargement     PSH:  History reviewed. No pertinent surgical history.  Allergies: No Known Allergies  Medications:   Prior to Admission medications   Medication Sig Start Date End Date Taking? Authorizing Provider  acetaminophen (TYLENOL) 500 MG tablet  Take 1,000 mg by mouth every 6 (six) hours as needed for moderate pain.   Yes [provider]  ibuprofen (ADVIL,MOTRIN) 200 MG tablet Take 400 mg by mouth as needed for headache or mild pain.   Yes [provider]  tamsulosin (FLOMAX) 0.4 MG CAPS capsule Take 0.4 mg by mouth daily. 03/30/18  Yes [provider]  diclofenac (VOLTAREN) 75 MG EC tablet Take 1 tablet (75 mg total) by mouth 2 (two) times daily. Patient not taking: Reported on 06/30/2018 10/19/14   Waldemar Dickens, MD  diclofenac sodium (VOLTAREN) 1 % GEL Apply 2-4 g topically 4 (four) times daily. Patient not taking: Reported on 06/30/2018 10/19/14   Waldemar Dickens, MD  docusate sodium (COLACE) 100 MG capsule Take 1 capsule (100 mg total) by mouth every 12 (twelve) hours. Patient not taking: Reported on 06/30/2018 05/09/16   Ezequiel Essex, MD  naproxen (NAPROSYN) 500 MG tablet Take 1 tablet (500 mg total) by mouth 3 (three) times daily with meals. Patient not taking: Reported on 06/30/2018 08/13/15   Junius Creamer, NP    Inpatient medications: . chlorhexidine gluconate (MEDLINE KIT)  15 mL Mouth Rinse BID  . feeding supplement (PRO-STAT SUGAR FREE 64)  30 mL Per Tube BID  . feeding supplement (VITAL HIGH PROTEIN)  1,000 mL Per Tube Q24H  . heparin  5,000 Units Subcutaneous Q8H  . mouth rinse  15 mL  Mouth Rinse 10 times per day  . pantoprazole sodium  40 mg Per Tube Daily    Discontinued Meds:   Medications Discontinued During This Encounter  Medication Reason  . citalopram (CELEXA) 20 MG tablet Patient Preference  . hydrOXYzine (VISTARIL) 25 MG capsule Patient Preference  . predniSONE (DELTASONE) 10 MG tablet Completed Course  . propofol (DIPRIVAN) 1000 MG/100ML infusion   . fentaNYL (SUBLIMAZE) injection 50 mcg Change in therapy  . fentaNYL 2570mg in NS 258m(1063mml) infusion-PREMIX Change in therapy  . artificial tears (LACRILUBE) ophthalmic ointment 1 application Duplicate  . propofol (DIPRIVAN)  1000 MG/TD/176HYfusion Duplicate  . fentaNYL (SUBLIMAZE) injection 100073g Duplicate  . fentaNYL (SUBLIMAZE) injection 100710g Duplicate  . fentaNYL 2500m32mn NS 250mL50mmcg82m infusion-PREMIX Duplicate  . fentaNYL (SUBLIMAZE) bolus via infusion 50 mcg Duplicate  . norepinephrine (LEVOPHED) 4mg in46mW 250mL pr11m infusion   . aspirin suppository 300 mg   . pantoprazole (PROTONIX) 80 mg in sodium chloride 0.9 % 250 mL (0.32 mg/mL) infusion   . pantoprazole (PROTONIX) injection 40 mg Change in therapy  . artificial tears (LACRILUBE) ophthalmic ointment 1 application   . fentaNYL (SUBLIMAZE) 100 MCG/2ML injection Returned to ADS  . piperacillin-tazobactam (ZOSYN) IVPB 3.375 g   . pantoprazole sodium (PROTONIX) 40 mg/20 mL oral suspension 40 mg   . midazolam (VERSED) injection 2 mg   . propofol (DIPRIVAN) 1000 MG/100ML infusion   . fentaNYL (SUBLIMAZE) injection 100 mcg   . fentaNYL 2500mcg in66m250mL (46m37ml) 66msion-PREMIX   . fentaNYL (SUBLIMAZE) bolus via infusion 50 mcg     Social History:  reports that he has never smoked. He has never used smokeless tobacco. He reports that he does not drink alcohol or use drugs.  Family History:   Family History  Problem Relation Age of Onset  . Dementia Mother     Review of systems not obtained due to patient factors. Weight change: 6.1 kg (13 lb 7.2 oz)  Intake/Output Summary (Last 24 hours) at 07/02/2018 1157 Last data filed at 07/02/2018 0900 Gross per 24 hour  Intake 2477.53 ml  Output 842 ml  Net 1635.53 ml   BP (!) 112/59   Pulse 83   Temp 98.4 F (36.9 C)   Resp (!) 22   Ht 5' 11"  (1.803 m)   Wt 102.2 kg (225 lb 5 oz)   SpO2 99%   BMI 31.42 kg/m  Vitals:   07/02/18 0900 07/02/18 1000 07/02/18 1120 07/02/18 1133  BP: 110/66 116/67  (!) 112/59  Pulse: 80 82  83  Resp: (!) 22 (!) 22  (!) 22  Temp: 98.8 F (37.1 C) 98.4 F (36.9 C) 98.2 F (36.8 C) 98.4 F (36.9 C)  TempSrc:      SpO2: 99% 98%  99%  Weight:       Height:         General appearance: intubated and unresponsive Head: Normocephalic, without obvious abnormality, atraumatic Resp: clear to auscultation bilaterally Cardio: regular rate and rhythm and no rub GI: soft, non-tender; bowel sounds normal; no masses,  no organomegaly Extremities: extremities normal, atraumatic, no cyanosis or edema  Labs: Basic Metabolic Panel: Recent Labs  Lab 06/30/18 1405 06/30/18 1417 06/30/18 1723 06/30/18 2344 07/01/18 0320 07/02/18 0437 07/02/18 0954  NA 140 136 141  137 137 138 137  --   K 4.5 4.3 6.4*  6.2* 4.1 3.8 6.1*  --   CL 99 100 107  105 105 105 104  --  CO2 17*  --  16* 14* 14* 17*  --   GLUCOSE 148* 134* 101*  97 106* 110* 84  --   BUN 25* 38* 27*  35* 36* 37* 55*  --   CREATININE 4.14* 3.60* 4.00*  3.80* 4.04* 4.36* 6.66*  --   ALBUMIN 2.9*  --   --   --  2.5*  --   --   CALCIUM 8.0*  --  6.8* 6.4* 7.2* 6.4*  --   PHOS  --   --   --   --   --  7.1* 7.2*   Liver Function Tests: Recent Labs  Lab 06/30/18 1405 07/01/18 0320  AST 2,690* 4,019*  ALT 3,795* 3,839*  ALKPHOS 70 35*  BILITOT 0.8 1.3*  PROT 4.9* 4.1*  ALBUMIN 2.9* 2.5*   No results for input(s): LIPASE, AMYLASE in the last 168 hours. No results for input(s): AMMONIA in the last 168 hours. CBC: Recent Labs  Lab 06/30/18 1405 06/30/18 1417 06/30/18 1723 07/01/18 0320 07/02/18 0437  WBC 11.6*  --   --  4.5 7.3  NEUTROABS 9.7*  --   --  3.8  --   HGB 13.8 16.3 14.6 13.2 12.3*  HCT 50.5 48.0 43.0 42.9 40.7  MCV 81.3  --   --  71.9* 72.8*  PLT 160  --   --  165 142*   PT/INR: @LABRCNTIP (inr:5) Cardiac Enzymes: ) Recent Labs  Lab 06/30/18 1450 06/30/18 1723 06/30/18 2210 06/30/18 2344 07/01/18 0320 07/01/18 1012  CKTOTAL 1,430*  --  5,391*  --  5,506*  --   TROPONINI  --  0.64*  --  1.12* 1.36* 1.18*   CBG: Recent Labs  Lab 07/01/18 2129 07/02/18 0013 07/02/18 0344 07/02/18 0730 07/02/18 1122  GLUCAP 58* 81 81 94 75    Iron  Studies: No results for input(s): IRON, TIBC, TRANSFERRIN, FERRITIN in the last 168 hours.  Xrays/Other Studies: Ct Abdomen Pelvis Wo Contrast  Result Date: 06/30/2018 CLINICAL DATA:  Patient unable to provide history. Out of hospital cardiac arrest. EXAM: CT ABDOMEN AND PELVIS WITHOUT CONTRAST TECHNIQUE: Multidetector CT imaging of the abdomen and pelvis was performed following the standard protocol without IV contrast. COMPARISON:  05/09/2016 FINDINGS: Lower chest: Hazy nodule opacification over the right middle lobe. Posterior bibasilar consolidation likely atelectasis right worse than left. No effusion. Hepatobiliary: Within normal. Pancreas: Normal. Spleen: Normal. Adrenals/Urinary Tract: Adrenal glands are normal. Kidneys are normal in size without hydronephrosis or nephrolithiasis. Ureters and bladder are unremarkable. Foley catheter is present within the bladder with a few small foci of air present. Stomach/Bowel: Nasogastric tube is present which is coiled once in the stomach with tip pointing posteriorly over the gastric fundus. Small bowel is normal. Appendix is normal. Colon is normal. Vascular/Lymphatic: Normal. Reproductive: Normal. Other: No free fluid or focal inflammatory change. Musculoskeletal: Mild spondylosis of the spine. IMPRESSION: No acute findings in the abdomen/pelvis. Subtle hazy nodular opacification over the right middle lobe which may be due to infection or an inflammatory process. Bibasilar consolidation likely atelectasis right worse than left. Nasogastric tube coiled once in the stomach with tip over the gastric fundus directed posteriorly. Electronically Signed   By: Marin Olp M.D.   On: 06/30/2018 16:22   Ct Head Wo Contrast  Result Date: 07/01/2018 CLINICAL DATA:  57 year old male status post cardiac arrest. Questionable ischemia on prior CT EXAM: CT HEAD WITHOUT CONTRAST TECHNIQUE: Contiguous axial images were obtained from the base of the skull through the  vertex  without intravenous contrast. COMPARISON:  06/30/2018 FINDINGS: Brain: No acute intracranial hemorrhage. No midline shift or mass effect. Unremarkable appearance of the ventricles. Gray-white differentiation is maintained. No loss of the gray-white junction. Questionable findings in the right parietal and bilateral occipital regions likely represent artifact. Vascular: No significant atherosclerotic calcifications. Skull: No acute fracture.  No soft tissue swelling. Sinuses/Orbits: Unremarkable appearance of the orbits and sinuses. No mastoid effusion. Bilateral debris of the external auditory canal Other: None. IMPRESSION: CT demonstrates no acute intracranial abnormality. Previously noted findings of bilateral occipital and right parietal regions, may have represented artifact. Electronically Signed   By: Corrie Mckusick D.O.   On: 07/01/2018 12:56   Ct Head Wo Contrast  Result Date: 06/30/2018 CLINICAL DATA:  57 year old male with altered mental status. Cardiac arrest. EXAM: CT HEAD WITHOUT CONTRAST TECHNIQUE: Contiguous axial images were obtained from the base of the skull through the vertex without intravenous contrast. COMPARISON:  None. FINDINGS: Brain: Hypodensity within bilateral occipital lobes noted. There is equivocal cortical hypodensity/edema within the RIGHT frontoparietal region (series 3: Image 27). Question anoxic injury vs posterior reversible encephalopathy syndrome (PRES). No hemorrhage, extra-axial collection, hydrocephalus, midline shift or mass effect. Vascular: No hyperdense vessel or unexpected calcification. Skull: Normal. Negative for fracture or focal lesion. Sinuses/Orbits: No acute finding. Other: Oral intubation tubes noted. IMPRESSION: 1. Bilateral occipital edema and equivocal RIGHT frontoparietal edema-question anoxic injury or posterior reversible encephalopathy syndrome. No hemorrhage or midline shift. Electronically Signed   By: Margarette Canada M.D.   On: 06/30/2018 14:51   Dg  Chest Port 1 View  Result Date: 07/02/2018 CLINICAL DATA:  History of ETT. EXAM: PORTABLE CHEST 1 VIEW COMPARISON:  June 30, 2018 FINDINGS: The ETT and left central line are in good position. The OG tube is not seen in its entirety. The cardiomediastinal silhouette is stable. No pneumothorax. No nodules or masses. Probable small effusion with underlying atelectasis. No overt edema. No other nodule, mass, or infiltrate. IMPRESSION: 1. Support apparatus as above. 2. Probable small left effusion with underlying atelectasis. 3. No other acute abnormality identified. Electronically Signed   By: Dorise Bullion III M.D   On: 07/02/2018 07:07   Dg Chest Port 1 View  Result Date: 06/30/2018 CLINICAL DATA:  Central line placement. EXAM: PORTABLE CHEST 1 VIEW COMPARISON:  06/30/2018 at 1403 hours FINDINGS: New left subclavian central venous line has its tip in the mid superior vena cava, just inferior to the left brachiocephalic vein, superior vena cava confluence. No pneumothorax. New nasal/orogastric tube passes below the diaphragm into the stomach and below the included field of view. Endotracheal tube is stable, well-positioned. There are interstitial and intervening hazy airspace type opacities which are centrally distributed, most evident in the left perihilar region, accentuated by the supine technique. IMPRESSION: 1. New left subclavian central venous line tip projects in the mid superior vena cava. No pneumothorax. 2. New nasal/orogastric tube passes below the diaphragm into the stomach, below the included field of view. 3. Lung aeration is worsened. There are central interstitial and hazy airspace lung opacities. Findings are consistent with interstitial pulmonary edema versus bilateral infection. Electronically Signed   By: Lajean Manes M.D.   On: 06/30/2018 17:10   Dg Chest Portable 1 View  Result Date: 06/30/2018 CLINICAL DATA:  Cardiac arrest, CPR EXAM: PORTABLE CHEST 1 VIEW COMPARISON:  None. FINDINGS:  Endotracheal tube is 2 cm above the carina. Heart is borderline in size. No confluent airspace opacities, effusions or edema. No pneumothorax.  No visible acute bony abnormality. IMPRESSION: Endotracheal tube 2 cm above the carina. No acute cardiopulmonary disease. Electronically Signed   By: Rolm Baptise M.D.   On: 06/30/2018 14:14   Dg Abd Portable 1v  Result Date: 06/30/2018 CLINICAL DATA:  Orogastric tube placement. EXAM: PORTABLE ABDOMEN - 1 VIEW COMPARISON:  None. FINDINGS: Orogastric tube extends well below the diaphragm to curl within the stomach. Normal bowel gas pattern. IMPRESSION: Well-positioned orogastric tube. Electronically Signed   By: Lajean Manes M.D.   On: 06/30/2018 15:16     Assessment/Plan: 1.  ARF, anuric in setting of cardiac arrest and prolonged down time.  Most consistent with ischemic ATN, however he was also + for cocaine.  Given concerns about irreversible anoxic brain injury vs a reversible process, will proceed with CVVHD for short term.  If after 3 days, he does not show any significant cognitive improvement would stop CVVHD and recommend transition to comfort measures due to high likelihood of severe anoxic brain injury 2. Hyperkalemia - due to #1.  As above will plan to initiate CVVHD 3. Cardiogenic shock- following unwitnessed arrest. On levophed and vasopressin per PCCM, Cardiology has signed off 4. VDRF- per PCCM 5. Aspiration PNA- per PCCM 6. Metabolic acidosis due to #1. Follow with CVVHD 7. Abnormal LFT's- likely due to shock liver 8. AMS- currently off sedation.  DDx: diffuse anoxic brain injury vs posterior reversible encephalopathy syndrome.  Will agree to short term CVVHD and re-evaluate his cognitive state in 3 days.  If no improvement, would then recommend stopping CVVHD and transitioning to comfort measures.  Neuro following. 9. Disposition- poor overall prognosis as above.     Christopher Dominguez 07/02/2018, 11:57 AM

## 2018-07-02 NOTE — Progress Notes (Signed)
CRRT started at 1430, baseline ACT 274. Dr Arrie Aranoladonato paged and made aware, orders to pause heparin for 1 hour and recheck ACT. Continue with CRRT anticoagulation protocol.

## 2018-07-02 NOTE — Progress Notes (Signed)
PULMONARY / CRITICAL CARE MEDICINE   Name: Christopher KaufmannShelton M Whang MRN: [id number] DOB: [birth year]    ADMISSION DATE:  06/30/2018  CHIEF COMPLAINT:  Cardiac arrest  HISTORY OF PRESENT ILLNESS:   57 y/o male had a cardiac arrest out of hospital and received 7 rounds of epinephrine to regain a pulse.     SUBJECTIVE:  Sedation off as of this morning Remains on normothermia protocol Worsening renal failure  VITAL SIGNS: BP 103/60   Pulse 80   Temp 98.6 F (37 C)   Resp (!) 22   Ht 5\' 11"  (1.803 m)   Wt 225 lb 5 oz (102.2 kg)   SpO2 98%   BMI 31.42 kg/m   HEMODYNAMICS:    VENTILATOR SETTINGS: Vent Mode: PRVC FiO2 (%):  [40 %-50 %] 40 % Set Rate:  [20 bmp-22 bmp] 22 bmp Vt Set:  [600 mL] 600 mL PEEP:  [10 cmH20] 10 cmH20 Plateau Pressure:  [21 cmH20-27 cmH20] 21 cmH20  INTAKE / OUTPUT: I/O last 3 completed shifts: In: 6898.6 [I.V.:6319.2; Other:30; NG/GT:210; IV Piggyback:339.4] Out: 1340 [Urine:190; Emesis/NG output:1150]  PHYSICAL EXAMINATION:  General:  In bed on vent HENT: NCAT ETT in place PULM: CTA B, vent supported breathing CV: RRR, no mgr GI: BS+, soft, nontender MSK: normal bulk and tone Neuro: corneal reflex intact, flicker to pain right lower extremity, otherwise no response to pain or verbal stimuli, no cough on suncitoning   LABS:  BMET Recent Labs  Lab 06/30/18 2344 07/01/18 0320 07/02/18 0437  NA 137 138 137  K 4.1 3.8 6.1*  CL 105 105 104  CO2 14* 14* 17*  BUN 36* 37* 55*  CREATININE 4.04* 4.36* 6.66*  GLUCOSE 106* 110* 84    Electrolytes Recent Labs  Lab 06/30/18 2344 07/01/18 0320 07/01/18 1358 07/02/18 0437  CALCIUM 6.4* 7.2*  --  6.4*  MG  --   --  1.7 1.7  PHOS  --   --   --  7.1*    CBC Recent Labs  Lab 06/30/18 1405  06/30/18 1723 07/01/18 0320 07/02/18 0437  WBC 11.6*  --   --  4.5 7.3  HGB 13.8   < > 14.6 13.2 12.3*  HCT 50.5   < > 43.0 42.9 40.7  PLT 160  --   --  165 142*   < > = values in this interval  not displayed.    Coag's Recent Labs  Lab 06/30/18 1723 06/30/18 2344  APTT 43* 43*  INR 1.88 2.15    Sepsis Markers Recent Labs  Lab 06/30/18 2210 07/01/18 0320 07/01/18 0653 07/02/18 0437  LATICACIDVEN 3.8* 4.2* 3.5*  --   PROCALCITON 35.56 49.84  --  60.76    ABG Recent Labs  Lab 07/01/18 0523 07/01/18 1022 07/02/18 0445  PHART 7.429 7.301* 7.272*  PCO2ART 23.3* 35.1 37.7  PO2ART 102.0 188.0* 135.0*    Liver Enzymes Recent Labs  Lab 06/30/18 1405 07/01/18 0320  AST 2,690* 4,019*  ALT 3,795* 3,839*  ALKPHOS 70 35*  BILITOT 0.8 1.3*  ALBUMIN 2.9* 2.5*    Cardiac Enzymes Recent Labs  Lab 06/30/18 2344 07/01/18 0320 07/01/18 1012  TROPONINI 1.12* 1.36* 1.18*    Glucose Recent Labs  Lab 07/01/18 1617 07/01/18 1939 07/01/18 2129 07/02/18 0013 07/02/18 0344 07/02/18 0730  GLUCAP 72 63* 58* 81 81 94    Imaging Ct Head Wo Contrast  Result Date: 07/01/2018 CLINICAL DATA:  57 year old male status post cardiac arrest. Questionable ischemia on prior  CT EXAM: CT HEAD WITHOUT CONTRAST TECHNIQUE: Contiguous axial images were obtained from the base of the skull through the vertex without intravenous contrast. COMPARISON:  06/30/2018 FINDINGS: Brain: No acute intracranial hemorrhage. No midline shift or mass effect. Unremarkable appearance of the ventricles. Gray-white differentiation is maintained. No loss of the gray-white junction. Questionable findings in the right parietal and bilateral occipital regions likely represent artifact. Vascular: No significant atherosclerotic calcifications. Skull: No acute fracture.  No soft tissue swelling. Sinuses/Orbits: Unremarkable appearance of the orbits and sinuses. No mastoid effusion. Bilateral debris of the external auditory canal Other: None. IMPRESSION: CT demonstrates no acute intracranial abnormality. Previously noted findings of bilateral occipital and right parietal regions, may have represented artifact.  Electronically Signed   By: Gilmer Mor D.O.   On: 07/01/2018 12:56   Dg Chest Port 1 View  Result Date: 07/02/2018 CLINICAL DATA:  History of ETT. EXAM: PORTABLE CHEST 1 VIEW COMPARISON:  June 30, 2018 FINDINGS: The ETT and left central line are in good position. The OG tube is not seen in its entirety. The cardiomediastinal silhouette is stable. No pneumothorax. No nodules or masses. Probable small effusion with underlying atelectasis. No overt edema. No other nodule, mass, or infiltrate. IMPRESSION: 1. Support apparatus as above. 2. Probable small left effusion with underlying atelectasis. 3. No other acute abnormality identified. Electronically Signed   By: Gerome Sam III M.D   On: 07/02/2018 07:07     STUDIES:  7/4 CT of the head worrisome for anoxic injury EEG 7/5 > diffuse cerebral disfunction that is non-specific TTE 7/5 LVEF 25-30%, mild LVD, enlarged RV with severe RV dysfuncion CT head 7/5 > no acute intracranial abnormality, prior CT findings may have been artifact  ANTIBIOTICS: Zosyn 7/4>>>  SIGNIFICANT EVENTS:  7/4>>> cardiac arrest  LINES/TUBES: ETT 7/4>>> R IJ TLC 7/4>>> R radial a-line 7/4>>>  Cultures: Blood 7/4>>> C. Diff 7/4 >> neg  DISCUSSION: 57 y/o male with minimal past medical history here after cardiac arrest.  ASSESSMENT / PLAN:  PULMONARY A: Acute respiratory failure with hypoxemia Aspiration pneumonia P:   Full mechanical vent support with high vent settings VAP prevention Daily WUA/SBT   CARDIOVASCULAR A:  Cardiac arrest Cardiogenic shock Demand ischemia Cocaine on UDS on admission P:  Tele Continue levophed, vasopressin to maintain MAP > 65 Check coox, lactic acid  RENAL A:   Worsening renal failure P:   Monitor BMET and UOP Replace electrolytes as needed Continue   GASTROINTESTINAL A:   No evidence of bleeding P:   Change PPI to daily  HEMATOLOGIC A:   No acute issues P:  Monitor for bleeding Transfuse  PRBC for Hgb < 7 gm/dL  INFECTIOUS A:   Aspiration pneumonia P:   Continue zosyn for now  ENDOCRINE A:   No acute issues   P:   Monitor glucose  NEUROLOGIC A:   Concern for anoxic brain injury P:   RASS goal:0 Stop sedation Continue normothermia protocol   FAMILY  - Updates: I called his son to discuss his situation and review the risks and benefits of hemodialysis.  He gave consent and stated that he will come to morrow.   - Inter-disciplinary family meet or Palliative Care meeting due by:  day 7   My cc time 38 minutes  Heber Eva, MD [facility] Pager: [phone] Cell: [phone] After 3pm or if no response, call [phone]   07/02/2018, 8:11 AM

## 2018-07-03 ENCOUNTER — Inpatient Hospital Stay (HOSPITAL_COMMUNITY): Payer: Medicare Other

## 2018-07-03 DIAGNOSIS — N179 Acute kidney failure, unspecified: Secondary | ICD-10-CM

## 2018-07-03 DIAGNOSIS — Z4659 Encounter for fitting and adjustment of other gastrointestinal appliance and device: Secondary | ICD-10-CM

## 2018-07-03 DIAGNOSIS — G253 Myoclonus: Secondary | ICD-10-CM

## 2018-07-03 LAB — RENAL FUNCTION PANEL
ALBUMIN: 2.2 g/dL — AB (ref 3.5–5.0)
ANION GAP: 6 (ref 5–15)
ANION GAP: 10 (ref 5–15)
Albumin: 2.2 g/dL — ABNORMAL LOW (ref 3.5–5.0)
BUN: 45 mg/dL — ABNORMAL HIGH (ref 6–20)
BUN: 39 mg/dL — ABNORMAL HIGH (ref 6–20)
CALCIUM: 6.6 mg/dL — AB (ref 8.9–10.3)
CHLORIDE: 103 mmol/L (ref 98–111)
CO2: 24 mmol/L (ref 22–32)
CO2: 26 mmol/L (ref 22–32)
CREATININE: 5.67 mg/dL — AB (ref 0.61–1.24)
CREATININE: 4.59 mg/dL — AB (ref 0.61–1.24)
Calcium: 7.2 mg/dL — ABNORMAL LOW (ref 8.9–10.3)
Chloride: 107 mmol/L (ref 98–111)
GFR calc Af Amer: 12 mL/min — ABNORMAL LOW (ref >60)
GFR calc non Af Amer: 10 mL/min — ABNORMAL LOW (ref >60)
GFR, EST AFRICAN AMERICAN: 15 mL/min — AB (ref >60)
GFR, EST NON AFRICAN AMERICAN: 13 mL/min — AB (ref >60)
GLUCOSE: 96 mg/dL (ref 70–99)
Glucose, Bld: 90 mg/dL (ref 70–99)
PHOSPHORUS: 5.2 mg/dL — AB (ref 2.5–4.6)
POTASSIUM: 4 mmol/L (ref 3.5–5.1)
Phosphorus: 4.5 mg/dL (ref 2.5–4.6)
Potassium: 4.1 mmol/L (ref 3.5–5.1)
SODIUM: 137 mmol/L (ref 135–145)
Sodium: 139 mmol/L (ref 135–145)

## 2018-07-03 LAB — CBC WITH DIFFERENTIAL/PLATELET
BASOS PCT: 0 %
Basophils Absolute: 0 10*3/uL (ref 0.0–0.1)
EOS PCT: 2 %
Eosinophils Absolute: 0.2 10*3/uL (ref 0.0–0.7)
HEMATOCRIT: 34.8 % — AB (ref 39.0–52.0)
HEMOGLOBIN: 11 g/dL — AB (ref 13.0–17.0)
LYMPHS PCT: 4 %
Lymphs Abs: 0.4 10*3/uL — ABNORMAL LOW (ref 0.7–4.0)
MCH: 21.9 pg — AB (ref 26.0–34.0)
MCHC: 31.6 g/dL (ref 30.0–36.0)
MCV: 69.2 fL — AB (ref 78.0–100.0)
MONOS PCT: 6 %
Monocytes Absolute: 0.6 10*3/uL (ref 0.1–1.0)
Neutro Abs: 8.7 10*3/uL — ABNORMAL HIGH (ref 1.7–7.7)
Neutrophils Relative %: 88 %
Platelets: 128 10*3/uL — ABNORMAL LOW (ref 150–400)
RBC: 5.03 MIL/uL (ref 4.22–5.81)
RDW: 16.2 % — ABNORMAL HIGH (ref 11.5–15.5)
WBC: 9.9 10*3/uL (ref 4.0–10.5)

## 2018-07-03 LAB — BASIC METABOLIC PANEL
ANION GAP: 6 (ref 5–15)
BUN: 44 mg/dL — ABNORMAL HIGH (ref 6–20)
CHLORIDE: 108 mmol/L (ref 98–111)
CO2: 24 mmol/L (ref 22–32)
CREATININE: 5.6 mg/dL — AB (ref 0.61–1.24)
Calcium: 6.6 mg/dL — ABNORMAL LOW (ref 8.9–10.3)
GFR calc non Af Amer: 10 mL/min — ABNORMAL LOW (ref >60)
GFR, EST AFRICAN AMERICAN: 12 mL/min — AB (ref >60)
Glucose, Bld: 95 mg/dL (ref 70–99)
POTASSIUM: 4.1 mmol/L (ref 3.5–5.1)
Sodium: 138 mmol/L (ref 135–145)

## 2018-07-03 LAB — POCT ACTIVATED CLOTTING TIME
ACTIVATED CLOTTING TIME: 274 s
ACTIVATED CLOTTING TIME: 175 s
ACTIVATED CLOTTING TIME: 180 s
ACTIVATED CLOTTING TIME: 175 s
ACTIVATED CLOTTING TIME: 180 s
ACTIVATED CLOTTING TIME: 180 s
ACTIVATED CLOTTING TIME: 191 s
ACTIVATED CLOTTING TIME: 197 s
ACTIVATED CLOTTING TIME: 186 s
ACTIVATED CLOTTING TIME: 197 s
ACTIVATED CLOTTING TIME: 186 s
ACTIVATED CLOTTING TIME: 191 s
Activated Clotting Time: 180 seconds
Activated Clotting Time: 180 seconds
Activated Clotting Time: 186 seconds
Activated Clotting Time: 186 seconds
Activated Clotting Time: 191 seconds
Activated Clotting Time: 197 seconds
Activated Clotting Time: 191 seconds

## 2018-07-03 LAB — GLUCOSE, CAPILLARY
Glucose-Capillary: 82 mg/dL (ref 70–99)
Glucose-Capillary: 90 mg/dL (ref 70–99)
Glucose-Capillary: 81 mg/dL (ref 70–99)
Glucose-Capillary: 80 mg/dL (ref 70–99)
Glucose-Capillary: 80 mg/dL (ref 70–99)

## 2018-07-03 LAB — MAGNESIUM
Magnesium: 2.2 mg/dL (ref 1.7–2.4)
Magnesium: 2.5 mg/dL — ABNORMAL HIGH (ref 1.7–2.4)

## 2018-07-03 LAB — PHOSPHORUS
PHOSPHORUS: 5.1 mg/dL — AB (ref 2.5–4.6)
Phosphorus: 4.4 mg/dL (ref 2.5–4.6)

## 2018-07-03 LAB — APTT: aPTT: 58 seconds — ABNORMAL HIGH (ref 24–36)

## 2018-07-03 MED ORDER — LABETALOL HCL 5 MG/ML IV SOLN
20.0000 mg | Freq: Once | INTRAVENOUS | Status: AC
  Administered 2018-07-03: 20 mg via INTRAVENOUS
  Filled 2018-07-03: qty 4

## 2018-07-03 MED ORDER — HYDRALAZINE HCL 25 MG PO TABS
25.0000 mg | ORAL_TABLET | Freq: Three times a day (TID) | ORAL | Status: DC
  Administered 2018-07-03 – 2018-07-05 (×8): 25 mg via ORAL
  Filled 2018-07-03 (×8): qty 1

## 2018-07-03 MED ORDER — FENTANYL CITRATE (PF) 100 MCG/2ML IJ SOLN
100.0000 ug | Freq: Once | INTRAMUSCULAR | Status: AC
  Administered 2018-07-03: 100 ug via INTRAVENOUS
  Filled 2018-07-03: qty 2

## 2018-07-03 MED ORDER — LABETALOL HCL 5 MG/ML IV SOLN
10.0000 mg | Freq: Four times a day (QID) | INTRAVENOUS | Status: DC | PRN
Start: 2018-07-03 — End: 2018-07-03

## 2018-07-03 MED ORDER — LABETALOL HCL 5 MG/ML IV SOLN
10.0000 mg | Freq: Four times a day (QID) | INTRAVENOUS | Status: DC | PRN
  Filled 2018-07-03: qty 4

## 2018-07-03 MED ORDER — ROCURONIUM BROMIDE 50 MG/5ML IV SOLN
100.0000 mg | Freq: Once | INTRAVENOUS | Status: AC
  Administered 2018-07-03: 100 mg via INTRAVENOUS
  Filled 2018-07-03 (×2): qty 10

## 2018-07-03 MED ORDER — HYDRALAZINE HCL 20 MG/ML IJ SOLN
10.0000 mg | INTRAMUSCULAR | Status: DC | PRN
  Administered 2018-07-03 – 2018-07-06 (×5): 10 mg via INTRAVENOUS
  Filled 2018-07-03 (×5): qty 1

## 2018-07-03 MED ORDER — MIDAZOLAM HCL 2 MG/2ML IJ SOLN
2.0000 mg | INTRAMUSCULAR | Status: DC | PRN
  Administered 2018-07-03 – 2018-07-06 (×11): 2 mg via INTRAVENOUS
  Filled 2018-07-03 (×11): qty 2

## 2018-07-03 MED ORDER — NITROGLYCERIN 2 % TD OINT
1.0000 [in_us] | TOPICAL_OINTMENT | Freq: Once | TRANSDERMAL | Status: AC
  Administered 2018-07-03: 1 [in_us] via TOPICAL
  Filled 2018-07-03: qty 30

## 2018-07-03 MED ORDER — LABETALOL HCL 5 MG/ML IV SOLN: 20.0000 mg | Freq: Once | INTRAVENOUS | Status: DC

## 2018-07-03 NOTE — Progress Notes (Signed)
vLTM EEG running. No skin breakdown. notified neuro 

## 2018-07-03 NOTE — Progress Notes (Signed)
S: intubated and unresponsive O:BP (!) 183/91   Pulse 86   Temp 99 F (37.2 C)   Resp (!) 26   Ht 5' 11"  (1.803 m)   Wt 103.4 kg (227 lb 15.3 oz)   SpO2 98%   BMI 31.79 kg/m   Intake/Output Summary (Last 24 hours) at 07/03/2018 1128 Last data filed at 07/03/2018 1000 Gross per 24 hour  Intake 1127.38 ml  Output 1928 ml  Net -800.62 ml   Intake/Output: I/O last 3 completed shifts: In: 2806.7 [I.V.:2140.4; NG/GT:431.3; IV Piggyback:235.1] Out: 1884 [Urine:42; Emesis/NG output:300; ZYSAY:3016]  Intake/Output this shift:  Total I/O In: -  Out: 465 [Other:465] Weight change: 1.2 kg (2 lb 10.3 oz) Gen: intubated CVS: no rub Resp: cta Abd: benign Ext: trace presacral edema  Recent Labs  Lab 06/30/18 1405  06/30/18 1723 06/30/18 2344 07/01/18 0320 07/02/18 0437 07/02/18 0954 07/02/18 1617 07/03/18 0400 07/03/18 0409  NA 140   < > 141  137 137 138 137  --  141 137 138  K 4.5   < > 6.4*  6.2* 4.1 3.8 6.1*  --  4.6 4.1 4.1  CL 99   < > 107  105 105 105 104  --  102 107 108  CO2 17*  --  16* 14* 14* 17*  --  21* 24 24  GLUCOSE 148*   < > 101*  97 106* 110* 84  --  94 96 95  BUN 25*   < > 27*  35* 36* 37* 55*  --  60* 45* 44*  CREATININE 4.14*   < > 4.00*  3.80* 4.04* 4.36* 6.66*  --  7.29* 5.67* 5.60*  ALBUMIN 2.9*  --   --   --  2.5*  --   --  2.2* 2.2*  --   CALCIUM 8.0*  --  6.8* 6.4* 7.2* 6.4*  --  6.6* 6.6* 6.6*  PHOS  --   --   --   --   --  7.1* 7.2* 6.6*  6.8* 5.2* 5.1*  AST 2,690*  --   --   --  4,019*  --   --   --   --   --   ALT 3,795*  --   --   --  3,839*  --   --   --   --   --    < > = values in this interval not displayed.   Liver Function Tests: Recent Labs  Lab 06/30/18 1405 07/01/18 0320 07/02/18 1617 07/03/18 0400  AST 2,690* 4,019*  --   --   ALT 3,795* 3,839*  --   --   ALKPHOS 70 35*  --   --   BILITOT 0.8 1.3*  --   --   PROT 4.9* 4.1*  --   --   ALBUMIN 2.9* 2.5* 2.2* 2.2*   No results for input(s): LIPASE, AMYLASE in the last  168 hours. No results for input(s): AMMONIA in the last 168 hours. CBC: Recent Labs  Lab 06/30/18 1405  07/01/18 0320 07/02/18 0437 07/03/18 0409  WBC 11.6*  --  4.5 7.3 9.9  NEUTROABS 9.7*  --  3.8  --  8.7*  HGB 13.8   < > 13.2 12.3* 11.0*  HCT 50.5   < > 42.9 40.7 34.8*  MCV 81.3  --  71.9* 72.8* 69.2*  PLT 160  --  165 142* 128*   < > = values in this interval not displayed.  Cardiac Enzymes: Recent Labs  Lab 06/30/18 1450 06/30/18 1723 06/30/18 2210 06/30/18 2344 07/01/18 0320 07/01/18 1012  CKTOTAL 1,430*  --  5,391*  --  5,506*  --   TROPONINI  --  0.64*  --  1.12* 1.36* 1.18*   CBG: Recent Labs  Lab 07/02/18 1122 07/02/18 1616 07/02/18 2004 07/02/18 2315 07/03/18 0446  GLUCAP 75 91 87 82 90    Iron Studies: No results for input(s): IRON, TIBC, TRANSFERRIN, FERRITIN in the last 72 hours. Studies/Results: Ct Head Wo Contrast  Result Date: 07/01/2018 CLINICAL DATA:  57 year old male status post cardiac arrest. Questionable ischemia on prior CT EXAM: CT HEAD WITHOUT CONTRAST TECHNIQUE: Contiguous axial images were obtained from the base of the skull through the vertex without intravenous contrast. COMPARISON:  06/30/2018 FINDINGS: Brain: No acute intracranial hemorrhage. No midline shift or mass effect. Unremarkable appearance of the ventricles. Gray-white differentiation is maintained. No loss of the gray-white junction. Questionable findings in the right parietal and bilateral occipital regions likely represent artifact. Vascular: No significant atherosclerotic calcifications. Skull: No acute fracture.  No soft tissue swelling. Sinuses/Orbits: Unremarkable appearance of the orbits and sinuses. No mastoid effusion. Bilateral debris of the external auditory canal Other: None. IMPRESSION: CT demonstrates no acute intracranial abnormality. Previously noted findings of bilateral occipital and right parietal regions, may have represented artifact. Electronically Signed    By: Corrie Mckusick D.O.   On: 07/01/2018 12:56   Dg Chest Port 1 View  Result Date: 07/03/2018 CLINICAL DATA:  Hypoxia EXAM: PORTABLE CHEST 1 VIEW COMPARISON:  July 02, 2018 FINDINGS: Endotracheal tube tip is 3.1 cm above the carina. There is a right jugular catheter with tip in superior vena cava. There is a left subclavian catheter with tip in superior vena cava. Nasogastric tube tip and side port are in the stomach. No pneumothorax. There is atelectatic change in the left base. The lungs elsewhere are clear. Heart size is upper normal with pulmonary vascularity normal. No adenopathy. No bone lesions. IMPRESSION: Tube and catheter positions as described without pneumothorax. Left base atelectasis. Lungs elsewhere clear. Stable cardiac prominence. Electronically Signed   By: Lowella Grip III M.D.   On: 07/03/2018 07:09   Dg Chest Port 1 View  Result Date: 07/02/2018 CLINICAL DATA:  Central line placement EXAM: PORTABLE CHEST 1 VIEW COMPARISON:  06/30/2018 chest radiograph. FINDINGS: Endotracheal tube tip is 3.7 cm above the carina. Enteric tube terminates in the gastric fundus. Left subclavian central venous catheter terminates in the middle third of the SVC. Right internal jugular central venous catheter terminates in the middle third of the SVC. Stable cardiomediastinal silhouette with top-normal heart size. No pneumothorax. No large pleural effusions, noting exclusion of portion of the left costophrenic angle. Hazy parahilar and bibasilar lung opacities, slightly worsened at the left lung base. IMPRESSION: 1. No pneumothorax.  Support structures as detailed. 2. Hazy parahilar and bibasilar lung opacities, slightly worsened at the left lung base, which could represent edema, atelectasis or pneumonia. Electronically Signed   By: Ilona Sorrel M.D.   On: 07/02/2018 12:10   Dg Chest Port 1 View  Result Date: 07/02/2018 CLINICAL DATA:  History of ETT. EXAM: PORTABLE CHEST 1 VIEW COMPARISON:  June 30, 2018  FINDINGS: The ETT and left central line are in good position. The OG tube is not seen in its entirety. The cardiomediastinal silhouette is stable. No pneumothorax. No nodules or masses. Probable small effusion with underlying atelectasis. No overt edema. No other nodule, mass, or infiltrate.  IMPRESSION: 1. Support apparatus as above. 2. Probable small left effusion with underlying atelectasis. 3. No other acute abnormality identified. Electronically Signed   By: Dorise Bullion III M.D   On: 07/02/2018 07:07   . chlorhexidine gluconate (MEDLINE KIT)  15 mL Mouth Rinse BID  . Chlorhexidine Gluconate Cloth  6 each Topical Daily  . hydrALAZINE  25 mg Oral Q8H  . mouth rinse  15 mL Mouth Rinse 10 times per day  . pantoprazole sodium  40 mg Per Tube Daily  . sodium chloride flush  10-40 mL Intracatheter Q12H    BMET    Component Value Date/Time   NA 138 07/03/2018 0409   K 4.1 07/03/2018 0409   CL 108 07/03/2018 0409   CO2 24 07/03/2018 0409   GLUCOSE 95 07/03/2018 0409   BUN 44 (H) 07/03/2018 0409   CREATININE 5.60 (H) 07/03/2018 0409   CALCIUM 6.6 (L) 07/03/2018 0409   GFRNONAA 10 (L) 07/03/2018 0409   GFRAA 12 (L) 07/03/2018 0409   CBC    Component Value Date/Time   WBC 9.9 07/03/2018 0409   RBC 5.03 07/03/2018 0409   HGB 11.0 (L) 07/03/2018 0409   HCT 34.8 (L) 07/03/2018 0409   PLT 128 (L) 07/03/2018 0409   MCV 69.2 (L) 07/03/2018 0409   MCH 21.9 (L) 07/03/2018 0409   MCHC 31.6 07/03/2018 0409   RDW 16.2 (H) 07/03/2018 0409   LYMPHSABS 0.4 (L) 07/03/2018 0409   MONOABS 0.6 07/03/2018 0409   EOSABS 0.2 07/03/2018 0409   BASOSABS 0.0 07/03/2018 0409    Assessment/Plan: 1.  ARF, anuric in setting of cardiac arrest and prolonged down time.  Most consistent with ischemic ATN, however he was also + for cocaine.  Given concerns about irreversible anoxic brain injury vs a reversible process, will continue with CVVHD for short term.  If after 3 days, he does not show any significant  cognitive improvement would stop CVVHD and recommend transition to comfort measures due to high likelihood of severe anoxic brain injury 1. Started CVVHD 07/02/18 with all fluids 4K/2.5Ca, dialysate at 1562m/hr, pre-filter rate 500 ml/hr, post filter rate 300 ml/hr, heparin for anticoagulation, UF 0-50 ml/hr as BP tolerates 2. RIJ temp HD cath placed 07/02/18 by PCCM 2. Hyperkalemia - due to #1.  As above will plan to initiate CVVHD 3. Cardiogenic shock- following unwitnessed arrest and prolonged CPR (over 20 minutes). Now off of levophed and vasopressin per PCCM, Cardiology has signed off 4. HTN- now getting prn meds per PCCM 5. VDRF- per PCCM 6. Rhabdomyolysis- will need to continue to follow CK levels but may also have been from CPR and arrest. 7. Aspiration PNA- per PCCM 8. Metabolic acidosis due to #1. Follow with CVVHD 9. Abnormal LFT's- likely due to shock liver 10. AMS- currently off sedation.  DDx: diffuse anoxic brain injury vs posterior reversible encephalopathy syndrome.  Will agree to short term CVVHD and re-evaluate his cognitive state in 3 days.  If no improvement, would then recommend stopping CVVHD and transitioning to comfort measures.  Neuro following. 1. Per nsg had some myoclonic jerking and given versed 2. Repeat EEG per Neuro 11. Cocaine + on UDS 12. Disposition- poor overall prognosis as above.     JDonetta Potts MD [employer]([phone]

## 2018-07-03 NOTE — Progress Notes (Signed)
eLink Physician-Brief Progress Note Patient Name: Christopher KaufmannShelton M Dominguez DOB: December 12, 1961 MRN: [id number]   Date of Service  07/03/2018  HPI/Events of Note  Multiple issues: 1. Hypertension - BP = 171/87 with MAP = 114 and 2. Intermittant anoxic myoclonus.   eICU Interventions  Will order: 1. Versed 2 mg IV Q 1 hour PRN anoxic myoclonus.  2. Hydralazine 10 mg IV Q 4 hours PRN SBP > 170 or DBP > 100.     Intervention Category Major Interventions: Other:;Hypertension - evaluation and management  Sommer,Steven Dennard Nipugene 07/03/2018, 3:27 AM

## 2018-07-03 NOTE — Progress Notes (Signed)
PULMONARY / CRITICAL CARE MEDICINE   Name: Christopher Dominguez MRN: [id number] DOB: Jan 10, 1961    ADMISSION DATE:  06/30/2018  CHIEF COMPLAINT:  Cardiac arrest  HISTORY OF PRESENT ILLNESS:   57 y/o male had a cardiac arrest out of hospital and received 7 rounds of epinephrine to regain a pulse.     SUBJECTIVE:  Noted by nursing to have some abnormal muscle movements overnight, given versed for possible myoclonus which helped Tube feeds on hold due to vomiting Became more hypertensive> given hydralazine  VITAL SIGNS: BP 134/83   Pulse 80   Temp 98.6 F (37 C) (Core)   Resp 15   Ht 5\' 11"  (1.803 m)   Wt 227 lb 15.3 oz (103.4 kg)   SpO2 99%   BMI 31.79 kg/m   HEMODYNAMICS: CVP:  [5 mmHg-9 mmHg] 9 mmHg  VENTILATOR SETTINGS: Vent Mode: PRVC FiO2 (%):  [40 %] 40 % Set Rate:  [22 bmp] 22 bmp Vt Set:  [600 mL] 600 mL PEEP:  [10 cmH20] 10 cmH20 Plateau Pressure:  [21 cmH20-22 cmH20] 22 cmH20  INTAKE / OUTPUT: I/O last 3 completed shifts: In: 2806.7 [I.V.:2140.4; NG/GT:431.3; IV Piggyback:235.1] Out: 1603 [Urine:42; Emesis/NG output:300; Other:1261]  PHYSICAL EXAMINATION:  General:  In bed on vent HENT: NCAT ETT in place PULM: CTA B, vent supported breathing CV: RRR, no mgr GI: BS+, soft, nontender MSK: normal bulk and tone Neuro: corneals intact, coughing today, some gag reflex, moves spontaneously  LABS:  BMET Recent Labs  Lab 07/02/18 1617 07/03/18 0400 07/03/18 0409  NA 141 137 138  K 4.6 4.1 4.1  CL 102 107 108  CO2 21* 24 24  BUN 60* 45* 44*  CREATININE 7.29* 5.67* 5.60*  GLUCOSE 94 96 95    Electrolytes Recent Labs  Lab 07/02/18 0954 07/02/18 1617 07/03/18 0400 07/03/18 0409  CALCIUM  --  6.6* 6.6* 6.6*  MG 1.7 1.9  --  2.2  PHOS 7.2* 6.6*  6.8* 5.2* 5.1*    CBC Recent Labs  Lab 07/01/18 0320 07/02/18 0437 07/03/18 0409  WBC 4.5 7.3 9.9  HGB 13.2 12.3* 11.0*  HCT 42.9 40.7 34.8*  PLT 165 142* 128*    Coag's Recent Labs  Lab  06/30/18 1723 06/30/18 2344 07/03/18 0409  APTT 43* 43* 58*  INR 1.88 2.15  --     Sepsis Markers Recent Labs  Lab 06/30/18 2210 07/01/18 0320 07/01/18 0653 07/02/18 0437 07/02/18 0954  LATICACIDVEN 3.8* 4.2* 3.5*  --  1.7  PROCALCITON 35.56 49.84  --  60.76  --     ABG Recent Labs  Lab 07/01/18 0523 07/01/18 1022 07/02/18 0445  PHART 7.429 7.301* 7.272*  PCO2ART 23.3* 35.1 37.7  PO2ART 102.0 188.0* 135.0*    Liver Enzymes Recent Labs  Lab 06/30/18 1405 07/01/18 0320 07/02/18 1617 07/03/18 0400  AST 2,690* 4,019*  --   --   ALT 3,795* 3,839*  --   --   ALKPHOS 70 35*  --   --   BILITOT 0.8 1.3*  --   --   ALBUMIN 2.9* 2.5* 2.2* 2.2*    Cardiac Enzymes Recent Labs  Lab 06/30/18 2344 07/01/18 0320 07/01/18 1012  TROPONINI 1.12* 1.36* 1.18*    Glucose Recent Labs  Lab 07/02/18 0730 07/02/18 1122 07/02/18 1616 07/02/18 2004 07/02/18 2315 07/03/18 0446  GLUCAP 94 75 91 87 82 90    Imaging Dg Chest Port 1 View  Result Date: 07/03/2018 CLINICAL DATA:  Hypoxia EXAM: PORTABLE  CHEST 1 VIEW COMPARISON:  July 02, 2018 FINDINGS: Endotracheal tube tip is 3.1 cm above the carina. There is a right jugular catheter with tip in superior vena cava. There is a left subclavian catheter with tip in superior vena cava. Nasogastric tube tip and side port are in the stomach. No pneumothorax. There is atelectatic change in the left base. The lungs elsewhere are clear. Heart size is upper normal with pulmonary vascularity normal. No adenopathy. No bone lesions. IMPRESSION: Tube and catheter positions as described without pneumothorax. Left base atelectasis. Lungs elsewhere clear. Stable cardiac prominence. Electronically Signed   By: Bretta BangWilliam  Woodruff III M.D.   On: 07/03/2018 07:09   Dg Chest Port 1 View  Result Date: 07/02/2018 CLINICAL DATA:  Central line placement EXAM: PORTABLE CHEST 1 VIEW COMPARISON:  06/30/2018 chest radiograph. FINDINGS: Endotracheal tube tip is  3.7 cm above the carina. Enteric tube terminates in the gastric fundus. Left subclavian central venous catheter terminates in the middle third of the SVC. Right internal jugular central venous catheter terminates in the middle third of the SVC. Stable cardiomediastinal silhouette with top-normal heart size. No pneumothorax. No large pleural effusions, noting exclusion of portion of the left costophrenic angle. Hazy parahilar and bibasilar lung opacities, slightly worsened at the left lung base. IMPRESSION: 1. No pneumothorax.  Support structures as detailed. 2. Hazy parahilar and bibasilar lung opacities, slightly worsened at the left lung base, which could represent edema, atelectasis or pneumonia. Electronically Signed   By: Delbert PhenixJason A Poff M.D.   On: 07/02/2018 12:10     STUDIES:  7/4 CT of the head worrisome for anoxic injury EEG 7/5 > diffuse cerebral disfunction that is non-specific TTE 7/5 LVEF 25-30%, mild LVD, enlarged RV with severe RV dysfuncion CT head 7/5 > no acute intracranial abnormality, prior CT findings may have been artifact  ANTIBIOTICS: Zosyn 7/4>>>  SIGNIFICANT EVENTS:  7/4>>> cardiac arrest 7/6 >>> spontaneous movements, pain response, CVVHD 7/7 >> early AM myoclonus  LINES/TUBES: ETT 7/4>>> L IJ TLC 7/4>>> R radial a-line 7/4>>> R IJ HD cath >>   Cultures: Blood 7/4>>> C. Diff 7/4 >> neg  DISCUSSION: 57 y/o male with minimal past medical history here after cardiac arrest.  ASSESSMENT / PLAN:  PULMONARY A: Acute respiratory failure with hypoxemia Aspiration pneumonia Some mucus plugging P:   Full mechanical vent support VAP prevention Daily WUA/SBT Mental status precludes extubation pulm toilette  CARDIOVASCULAR A:  Cardiac arrest Cardiogenic shock > resolved, coox and lactic acid 7/6 reassuring hypertension Demand ischemia Cocaine on UDS on admission P:  Tele Start scheduled hydralazine Consider repeat echo this week  RENAL A:    Worsening renal failure P:   CVVHD per renal  GASTROINTESTINAL A:   No evidence of bleeding Shock liver P:   Continue PPI daily Continue tube feeding Repeat LFT in AM  HEMATOLOGIC A:   No acute issues P:  Monitor for bleeding Transfuse PRBC for Hgb < 7 gm/dL  INFECTIOUS A:   Aspiration pneumonia P:   Continue Zosyn for now  ENDOCRINE A:   No acute issues   P:   Monitor glucose  NEUROLOGIC A:   Concern for anoxic brain injury> exam seemed improved on 7/6, but then later developed myoclonus P:   RASS goal 0 Minimize sedation Prn versed for myoclonus EEG today?> will discuss with neuro   FAMILY  - Updates: [place] from [place] of state  - Inter-disciplinary family meet or Palliative Care meeting due by:  day  7   My cc time 38 minutes  Heber [place], MD [place] [facility] Pager: [phone] Cell: [phone] After 3pm or if no response, call [phone]   07/03/2018, 7:33 AM

## 2018-07-03 NOTE — Progress Notes (Signed)
Progress Note  Patient Name: Christopher Dominguez Date of Encounter: 07/03/2018  Primary Cardiologist: Buford Dresser, MD   Subjective   Sister from [place] is at bedside.  Spoke with Dr. Lake Bells this morning.  Feels patient is showing some evidence of improvement.  Only being dialyzed.   Inpatient Medications    Scheduled Meds: . chlorhexidine gluconate (MEDLINE KIT)  15 mL Mouth Rinse BID  . Chlorhexidine Gluconate Cloth  6 each Topical Daily  . hydrALAZINE  25 mg Oral Q8H  . mouth rinse  15 mL Mouth Rinse 10 times per day  . pantoprazole sodium  40 mg Per Tube Daily  . sodium chloride flush  10-40 mL Intracatheter Q12H   Continuous Infusions: . sodium chloride 10 mL/hr at 07/03/18 0600  . sodium chloride Stopped (07/02/18 1951)  . cisatracurium (NIMBEX) infusion Stopped (07/01/18 0941)  . feeding supplement (VITAL AF 1.2 CAL) Stopped (07/03/18 0700)  . heparin 10,000 units/ 20 mL infusion syringe 750 Units/hr (07/03/18 0639)  . heparin 1,000 mL (07/02/18 1338)  . norepinephrine (LEVOPHED) Adult infusion Stopped (07/02/18 1356)  . piperacillin-tazobactam Stopped (07/03/18 0557)  . dialysis replacement fluid (prismasate) 300 mL/hr at 07/03/18 0701  . dialysis replacement fluid (prismasate) 500 mL/hr at 07/03/18 0010  . dialysate (PRISMASATE) 1,500 mL/hr at 07/03/18 0657  . vasopressin (PITRESSIN) infusion - *FOR SHOCK* Stopped (07/02/18 0920)   PRN Meds: sodium chloride, fentaNYL (SUBLIMAZE) injection, heparin, heparin, heparin, hydrALAZINE, midazolam, sodium chloride flush   Vital Signs    Vitals:   07/03/18 0700 07/03/18 0742 07/03/18 0757 07/03/18 0800  BP: 123/90  (!) 173/94 (!) 144/96  Pulse: 88  79 83  Resp: 14  (!) 28 17  Temp:  98.6 F (37 C) 98.6 F (37 C)   TempSrc:      SpO2: 97%  97% 98%  Weight:      Height:        Intake/Output Summary (Last 24 hours) at 07/03/2018 0828 Last data filed at 07/03/2018 0800 Gross per 24 hour  Intake  1337.03 ml  Output 1611 ml  Net -273.97 ml   Filed Weights   07/01/18 0410 07/02/18 0500 07/03/18 0437  Weight: 226 lb 3.1 oz (102.6 kg) 225 lb 5 oz (102.2 kg) 227 lb 15.3 oz (103.4 kg)    Telemetry    Normal sinus rhythm and sinus tachycardia- Personally Reviewed  ECG    No new tracing- Personally Reviewed  Physical Exam  Intubated/not responsive GEN:   On ventilator Neck:   Unable to assess JVD Cardiac: no murmurs, rubs.  Is heard. Respiratory: Clear to auscultation bilaterally. GI: Soft, nontender, non-distended  MS: No edema; No deformity. Neuro:    Unresponsive   Labs    Chemistry Recent Labs  Lab 06/30/18 1405  07/01/18 0320  07/02/18 1617 07/03/18 0400 07/03/18 0409  NA 140   < > 138   < > 141 137 138  K 4.5   < > 3.8   < > 4.6 4.1 4.1  CL 99   < > 105   < > 102 107 108  CO2 17*   < > 14*   < > 21* 24 24  GLUCOSE 148*   < > 110*   < > 94 96 95  BUN 25*   < > 37*   < > 60* 45* 44*  CREATININE 4.14*   < > 4.36*   < > 7.29* 5.67* 5.60*  CALCIUM 8.0*   < >  7.2*   < > 6.6* 6.6* 6.6*  PROT 4.9*  --  4.1*  --   --   --   --   ALBUMIN 2.9*  --  2.5*  --  2.2* 2.2*  --   AST 2,690*  --  4,019*  --   --   --   --   ALT 3,795*  --  3,839*  --   --   --   --   ALKPHOS 70  --  35*  --   --   --   --   BILITOT 0.8  --  1.3*  --   --   --   --   GFRNONAA 15*   < > 14*   < > 7* 10* 10*  GFRAA 17*   < > 16*   < > 9* 12* 12*  ANIONGAP 24*   < > 19*   < > 18* 6 6   < > = values in this interval not displayed.     Hematology Recent Labs  Lab 07/01/18 0320 07/02/18 0437 07/03/18 0409  WBC 4.5 7.3 9.9  RBC 5.97* 5.59 5.03  HGB 13.2 12.3* 11.0*  HCT 42.9 40.7 34.8*  MCV 71.9* 72.8* 69.2*  MCH 22.1* 22.0* 21.9*  MCHC 30.8 30.2 31.6  RDW 16.8* 17.7* 16.2*  PLT 165 142* 128*    Cardiac Enzymes Recent Labs  Lab 06/30/18 1723 06/30/18 2344 07/01/18 0320 07/01/18 1012  TROPONINI 0.64* 1.12* 1.36* 1.18*    Recent Labs  Lab 06/30/18 1415  TROPIPOC 0.42*       BNPNo results for input(s): BNP, PROBNP in the last 168 hours.   DDimer No results for input(s): DDIMER in the last 168 hours.   Radiology    Ct Head Wo Contrast  Result Date: 07/01/2018 CLINICAL DATA:  57 year old male status post cardiac arrest. Questionable ischemia on prior CT EXAM: CT HEAD WITHOUT CONTRAST TECHNIQUE: Contiguous axial images were obtained from the base of the skull through the vertex without intravenous contrast. COMPARISON:  06/30/2018 FINDINGS: Brain: No acute intracranial hemorrhage. No midline shift or mass effect. Unremarkable appearance of the ventricles. Gray-white differentiation is maintained. No loss of the gray-white junction. Questionable findings in the right parietal and bilateral occipital regions likely represent artifact. Vascular: No significant atherosclerotic calcifications. Skull: No acute fracture.  No soft tissue swelling. Sinuses/Orbits: Unremarkable appearance of the orbits and sinuses. No mastoid effusion. Bilateral debris of the external auditory canal Other: None. IMPRESSION: CT demonstrates no acute intracranial abnormality. Previously noted findings of bilateral occipital and right parietal regions, may have represented artifact. Electronically Signed   By: Corrie Mckusick D.O.   On: 07/01/2018 12:56   Dg Chest Port 1 View  Result Date: 07/03/2018 CLINICAL DATA:  Hypoxia EXAM: PORTABLE CHEST 1 VIEW COMPARISON:  July 02, 2018 FINDINGS: Endotracheal tube tip is 3.1 cm above the carina. There is a right jugular catheter with tip in superior vena cava. There is a left subclavian catheter with tip in superior vena cava. Nasogastric tube tip and side port are in the stomach. No pneumothorax. There is atelectatic change in the left base. The lungs elsewhere are clear. Heart size is upper normal with pulmonary vascularity normal. No adenopathy. No bone lesions. IMPRESSION: Tube and catheter positions as described without pneumothorax. Left base atelectasis.  Lungs elsewhere clear. Stable cardiac prominence. Electronically Signed   By: Lowella Grip III M.D.   On: 07/03/2018 07:09   Dg Chest Port 1  View  Result Date: 07/02/2018 CLINICAL DATA:  Central line placement EXAM: PORTABLE CHEST 1 VIEW COMPARISON:  06/30/2018 chest radiograph. FINDINGS: Endotracheal tube tip is 3.7 cm above the carina. Enteric tube terminates in the gastric fundus. Left subclavian central venous catheter terminates in the middle third of the SVC. Right internal jugular central venous catheter terminates in the middle third of the SVC. Stable cardiomediastinal silhouette with top-normal heart size. No pneumothorax. No large pleural effusions, noting exclusion of portion of the left costophrenic angle. Hazy parahilar and bibasilar lung opacities, slightly worsened at the left lung base. IMPRESSION: 1. No pneumothorax.  Support structures as detailed. 2. Hazy parahilar and bibasilar lung opacities, slightly worsened at the left lung base, which could represent edema, atelectasis or pneumonia. Electronically Signed   By: Ilona Sorrel M.D.   On: 07/02/2018 12:10   Dg Chest Port 1 View  Result Date: 07/02/2018 CLINICAL DATA:  History of ETT. EXAM: PORTABLE CHEST 1 VIEW COMPARISON:  June 30, 2018 FINDINGS: The ETT and left central line are in good position. The OG tube is not seen in its entirety. The cardiomediastinal silhouette is stable. No pneumothorax. No nodules or masses. Probable small effusion with underlying atelectasis. No overt edema. No other nodule, mass, or infiltrate. IMPRESSION: 1. Support apparatus as above. 2. Probable small left effusion with underlying atelectasis. 3. No other acute abnormality identified. Electronically Signed   By: Dorise Bullion III M.D   On: 07/02/2018 07:07    Cardiac Studies   2D Doppler echocardiogram 519: Study Conclusions  - Left ventricle: The cavity size was normal. Wall thickness was   increased in a pattern of mild LVH. Systolic  function was   severely reduced. The estimated ejection fraction was in the   range of 25% to 30%. Diffuse hypokinesis. Doppler parameters are   consistent with abnormal left ventricular relaxation (grade 1   diastolic dysfunction). - Aortic root: The aortic root was mildly dilated. - Mitral valve: Calcified annulus. - Right ventricle: The cavity size was mildly dilated. Systolic   function was severely reduced. - Right atrium: The atrium was mildly dilated.  Impressions:  - Technically difficult; definity used; severe LV dysfunction; mild   LVH; mild diastolic dysfunction; enlarged right side with   moderate to severe RV dysfunction.  Patient Profile     57 y.o. male male with a hx of depression (on disability for mental health, prostate enlargement) who is being seen today for the evaluation of cardiac arrest at the request of Dr. Nelda Marseille.  Multiorgan failure syndrome and LVEF 25 to 30%.  Assessment & Plan    1. Acute systolic heart failure, suspect a toxic component.  Repeat echo tomorrow to see if there is improvement.  Previous significant elevation in BNP greater than 5000.  Troponin flat less than 2.0 since admission. 2. Elevated troponin with a flat trend is noted.  Not likely related to ACS. 3. Acute and progressive kidney failure with creatinine 6.6.  Currently being dialyzed.  Plan repeat echo in a.m. to determine if significant acute improvement.  Suspect there will be improvement given blood pressure.  For questions or updates, please contact [place] Please consult [email or web address] for contact info under Cardiology/STEMI.      Signed, Sinclair Grooms, MD  07/03/2018, 8:28 AM

## 2018-07-03 NOTE — Progress Notes (Signed)
NEURO HOSPITALIST PROGRESS NOTE   Subjective: Patient in bed eyes closed, intubated, breathing above the vent. Exam done about 1 hour after patient received PRN dose of versed. No myoclonic jerks seen on exam. Patient on CRRT.  Exam: Vitals:   07/03/18 0600 07/03/18 0742  BP: 134/83   Pulse: 80   Resp: 15   Temp:  98.6 F (37 C)  SpO2: 99%     Physical Exam   HEENT-  Normocephalic, no lesions, without obvious abnormality.  Normal external eye and conjunctiva.   Cardiovascular- S1-S2 audible, pulses palpable throughout   Lungs-no rhonchi or wheezing noted, no excessive working breathing.  Saturations within normal limits Extremities- Warm, dry and intact, edematous Musculoskeletal-swelling noted Skin-warm and dry,intact  Neuro:  Mental Status: Patient does not respond to verbal stimuli. Opened eyes briefly to deep sternal rub.  Does not follow commands.  No verbalizations noted.  Cranial Nerves: Corneal response noted. No pupillary response. Pupils remain 1-69m. Negative dolls eyes. Patient intubated, breathing above the vent. Face appears symmetric. Motor/ Sensory: Does not move extremities spontaneously. No posturing noted. Patient flexes BLE in response to noxious stimuli. No localization noted. BUE patient does not move in response to painful stimuli.  Tone and bulk:normal tone throughout; no atrophy noted Deep Tendon Reflexes: 2+ patella, biceps muted Plantars: Right: downgoing   Left: downgoing Cerebellar/ Gait: UTA    Medications:  Scheduled: . chlorhexidine gluconate (MEDLINE KIT)  15 mL Mouth Rinse BID  . Chlorhexidine Gluconate Cloth  6 each Topical Daily  . hydrALAZINE  25 mg Oral Q8H  . mouth rinse  15 mL Mouth Rinse 10 times per day  . pantoprazole sodium  40 mg Per Tube Daily  . sodium chloride flush  10-40 mL Intracatheter Q12H   Continuous: . sodium chloride 10 mL/hr at 07/03/18 0600  . sodium chloride Stopped (07/02/18 1951)  .  cisatracurium (NIMBEX) infusion Stopped (07/01/18 0941)  . feeding supplement (VITAL AF 1.2 CAL) Stopped (07/03/18 0700)  . heparin 10,000 units/ 20 mL infusion syringe 750 Units/hr (07/03/18 0639)  . heparin 1,000 mL (07/02/18 1338)  . norepinephrine (LEVOPHED) Adult infusion Stopped (07/02/18 1356)  . piperacillin-tazobactam Stopped (07/03/18 0557)  . dialysis replacement fluid (prismasate) 300 mL/hr at 07/03/18 0701  . dialysis replacement fluid (prismasate) 500 mL/hr at 07/03/18 0010  . dialysate (PRISMASATE) 1,500 mL/hr at 07/03/18 0657  . vasopressin (PITRESSIN) infusion - *FOR SHOCK* Stopped (07/02/18 0920)   PWKG:SUPJSRchloride, fentaNYL (SUBLIMAZE) injection, heparin, heparin, heparin, hydrALAZINE, midazolam, sodium chloride flush  Pertinent Labs/Diagnostics:   Ct Head Wo Contrast  Result Date: 07/01/2018 CLINICAL DATA:  57year old male status post cardiac arrest. Questionable ischemia on prior CT EXAM: CT HEAD WITHOUT CONTRAST TECHNIQUE: Contiguous axial images were obtained from the base of the skull through the vertex without intravenous contrast. COMPARISON:  06/30/2018 FINDINGS: Brain: No acute intracranial hemorrhage. No midline shift or mass effect. Unremarkable appearance of the ventricles. Gray-white differentiation is maintained. No loss of the gray-white junction. Questionable findings in the right parietal and bilateral occipital regions likely represent artifact. Vascular: No significant atherosclerotic calcifications. Skull: No acute fracture.  No soft tissue swelling. Sinuses/Orbits: Unremarkable appearance of the orbits and sinuses. No mastoid effusion. Bilateral debris of the external auditory canal Other: None. IMPRESSION: CT demonstrates no acute intracranial abnormality. Previously noted findings of bilateral occipital and right parietal regions, may have represented  artifact. Electronically Signed   By: Corrie Mckusick D.O.   On: 07/01/2018 12:56   Dg Chest Port 1  View  Result Date: 07/03/2018 CLINICAL DATA:  Hypoxia EXAM: PORTABLE CHEST 1 VIEW COMPARISON:  July 02, 2018 FINDINGS: Endotracheal tube tip is 3.1 cm above the carina. There is a right jugular catheter with tip in superior vena cava. There is a left subclavian catheter with tip in superior vena cava. Nasogastric tube tip and side port are in the stomach. No pneumothorax. There is atelectatic change in the left base. The lungs elsewhere are clear. Heart size is upper normal with pulmonary vascularity normal. No adenopathy. No bone lesions. IMPRESSION: Tube and catheter positions as described without pneumothorax. Left base atelectasis. Lungs elsewhere clear. Stable cardiac prominence. Electronically Signed   By: Lowella Grip III M.D.   On: 07/03/2018 07:09   Dg Chest Port 1 View  Result Date: 07/02/2018 CLINICAL DATA:  Central line placement EXAM: PORTABLE CHEST 1 VIEW COMPARISON:  06/30/2018 chest radiograph. FINDINGS: Endotracheal tube tip is 3.7 cm above the carina. Enteric tube terminates in the gastric fundus. Left subclavian central venous catheter terminates in the middle third of the SVC. Right internal jugular central venous catheter terminates in the middle third of the SVC. Stable cardiomediastinal silhouette with top-normal heart size. No pneumothorax. No large pleural effusions, noting exclusion of portion of the left costophrenic angle. Hazy parahilar and bibasilar lung opacities, slightly worsened at the left lung base. IMPRESSION: 1. No pneumothorax.  Support structures as detailed. 2. Hazy parahilar and bibasilar lung opacities, slightly worsened at the left lung base, which could represent edema, atelectasis or pneumonia. Electronically Signed   By: Ilona Sorrel M.D.   On: 07/02/2018 12:10   Dg Chest Port 1 View  Result Date: 07/02/2018 CLINICAL DATA:  History of ETT. EXAM: PORTABLE CHEST 1 VIEW COMPARISON:  June 30, 2018 FINDINGS: The ETT and left central line are in good position. The  OG tube is not seen in its entirety. The cardiomediastinal silhouette is stable. No pneumothorax. No nodules or masses. Probable small effusion with underlying atelectasis. No overt edema. No other nodule, mass, or infiltrate. IMPRESSION: 1. Support apparatus as above. 2. Probable small left effusion with underlying atelectasis. 3. No other acute abnormality identified. Electronically Signed   By: Dorise Bullion III M.D   On: 07/02/2018 07:07    Impression:  57 year old AA male with unknown PMH who presented to [facility] s/p cardiac arrest. Patient was found down in asystole. ROSC was achieved after 7 rounds of Epi. 1. Patient on exam is 1 hour post PRN versed administration but corneal reflexes present, and BLE flexor responses noted with noxious stimuli. No myoclonic jerking noted during exam. Some extensor posturing was noted overnight per nursing.  2. Toxicology screen was + for cocaine and tetrahydrocannabinol.  3. CT head 06/30/18 concerning for edema- question anoxic brain injury vs PRES. 4. Overall clinical picture most consistent with diffuse anoxic brain injury   Recommendations:  -- Repeat EEG to assess for possible cortical myoclonus -- MRI Brain -- Repeat neurological examination for prognostication 72 hours after fully warmed and completely off all sedation for 72 hours as well   Laurey Morale, MSN, NP-C [facility] [phone]  40 minutes spent in the neurological evaluation and management of this critically ill patient. Time spent included coordination of care.   Electronically signed: Dr. Kerney Elbe 07/03/2018, 7:47 AM

## 2018-07-04 ENCOUNTER — Inpatient Hospital Stay (HOSPITAL_COMMUNITY): Payer: Medicare Other

## 2018-07-04 DIAGNOSIS — I5023 Acute on chronic systolic (congestive) heart failure: Secondary | ICD-10-CM

## 2018-07-04 LAB — GLUCOSE, CAPILLARY
GLUCOSE-CAPILLARY: 86 mg/dL (ref 70–99)
GLUCOSE-CAPILLARY: 86 mg/dL (ref 70–99)
GLUCOSE-CAPILLARY: 85 mg/dL (ref 70–99)
GLUCOSE-CAPILLARY: 64 mg/dL — AB (ref 70–99)
Glucose-Capillary: 136 mg/dL — ABNORMAL HIGH (ref 70–99)
Glucose-Capillary: 67 mg/dL — ABNORMAL LOW (ref 70–99)
Glucose-Capillary: 71 mg/dL (ref 70–99)
Glucose-Capillary: 77 mg/dL (ref 70–99)
Glucose-Capillary: 81 mg/dL (ref 70–99)
Glucose-Capillary: 83 mg/dL (ref 70–99)

## 2018-07-04 LAB — RENAL FUNCTION PANEL
ALBUMIN: 2.4 g/dL — AB (ref 3.5–5.0)
ANION GAP: 12 (ref 5–15)
ANION GAP: 13 (ref 5–15)
Albumin: 2.2 g/dL — ABNORMAL LOW (ref 3.5–5.0)
BUN: 38 mg/dL — ABNORMAL HIGH (ref 6–20)
BUN: 30 mg/dL — ABNORMAL HIGH (ref 6–20)
CALCIUM: 7.8 mg/dL — AB (ref 8.9–10.3)
CHLORIDE: 102 mmol/L (ref 98–111)
CO2: 26 mmol/L (ref 22–32)
CO2: 24 mmol/L (ref 22–32)
CREATININE: 3.91 mg/dL — AB (ref 0.61–1.24)
Calcium: 7.5 mg/dL — ABNORMAL LOW (ref 8.9–10.3)
Chloride: 101 mmol/L (ref 98–111)
Creatinine, Ser: 4.61 mg/dL — ABNORMAL HIGH (ref 0.61–1.24)
GFR calc Af Amer: 15 mL/min — ABNORMAL LOW (ref >60)
GFR calc Af Amer: 18 mL/min — ABNORMAL LOW (ref >60)
GFR calc non Af Amer: 13 mL/min — ABNORMAL LOW (ref >60)
GFR calc non Af Amer: 16 mL/min — ABNORMAL LOW (ref >60)
GLUCOSE: 80 mg/dL (ref 70–99)
Glucose, Bld: 91 mg/dL (ref 70–99)
PHOSPHORUS: 4.3 mg/dL (ref 2.5–4.6)
PHOSPHORUS: 3.4 mg/dL (ref 2.5–4.6)
POTASSIUM: 4 mmol/L (ref 3.5–5.1)
Potassium: 4.1 mmol/L (ref 3.5–5.1)
SODIUM: 138 mmol/L (ref 135–145)
Sodium: 140 mmol/L (ref 135–145)

## 2018-07-04 LAB — POCT ACTIVATED CLOTTING TIME
ACTIVATED CLOTTING TIME: 191 s
ACTIVATED CLOTTING TIME: 197 s
ACTIVATED CLOTTING TIME: 197 s
ACTIVATED CLOTTING TIME: 202 s
Activated Clotting Time: 186 seconds
Activated Clotting Time: 191 seconds
Activated Clotting Time: 202 seconds
Activated Clotting Time: 191 seconds
Activated Clotting Time: 191 seconds
Activated Clotting Time: 202 seconds

## 2018-07-04 LAB — HEPATIC FUNCTION PANEL
ALBUMIN: 2.2 g/dL — AB (ref 3.5–5.0)
ALT: 1173 U/L — ABNORMAL HIGH (ref 0–44)
AST: 247 U/L — AB (ref 15–41)
Alkaline Phosphatase: 65 U/L (ref 38–126)
Bilirubin, Direct: 0.3 mg/dL — ABNORMAL HIGH (ref 0.0–0.2)
Indirect Bilirubin: 0.5 mg/dL (ref 0.3–0.9)
TOTAL PROTEIN: 5.2 g/dL — AB (ref 6.5–8.1)
Total Bilirubin: 0.8 mg/dL (ref 0.3–1.2)

## 2018-07-04 LAB — ECHOCARDIOGRAM COMPLETE
HEIGHTINCHES: 71 in
Weight: 3545 oz

## 2018-07-04 LAB — APTT: APTT: 49 s — AB (ref 24–36)

## 2018-07-04 LAB — MAGNESIUM: Magnesium: 2.5 mg/dL — ABNORMAL HIGH (ref 1.7–2.4)

## 2018-07-04 MED ORDER — VITAL AF 1.2 CAL PO LIQD
1000.0000 mL | ORAL | Status: DC
  Administered 2018-07-04: 1000 mL

## 2018-07-04 MED ORDER — PRO-STAT SUGAR FREE PO LIQD
60.0000 mL | Freq: Two times a day (BID) | ORAL | Status: DC
  Administered 2018-07-04 – 2018-07-09 (×6): 60 mL
  Filled 2018-07-04 (×6): qty 60

## 2018-07-04 MED ORDER — PRISMASOL BGK 4/2.5 32-4-2.5 MEQ/L IV SOLN
INTRAVENOUS | Status: DC
  Administered 2018-07-04 – 2018-07-08 (×15): via INTRAVENOUS_CENTRAL
  Filled 2018-07-04 (×18): qty 5000

## 2018-07-04 MED ORDER — FENTANYL CITRATE (PF) 100 MCG/2ML IJ SOLN
INTRAMUSCULAR | Status: AC
  Administered 2018-07-04: 100 ug via INTRAVENOUS
  Filled 2018-07-04: qty 8

## 2018-07-04 MED ORDER — DEXTROSE 50 % IV SOLN
25.0000 mL | Freq: Once | INTRAVENOUS | Status: AC
  Administered 2018-07-04: 25 mL via INTRAVENOUS

## 2018-07-04 MED ORDER — CHLORHEXIDINE GLUCONATE CLOTH 2 % EX PADS
6.0000 | MEDICATED_PAD | Freq: Every day | CUTANEOUS | Status: DC
  Administered 2018-07-05 – 2018-07-19 (×12): 6 via TOPICAL

## 2018-07-04 MED ORDER — PRISMASOL BGK 4/2.5 32-4-2.5 MEQ/L IV SOLN
INTRAVENOUS | Status: DC
  Administered 2018-07-04 – 2018-07-08 (×15): via INTRAVENOUS_CENTRAL
  Filled 2018-07-04 (×21): qty 5000

## 2018-07-04 NOTE — Progress Notes (Signed)
PULMONARY / CRITICAL CARE MEDICINE   Name: Christopher Dominguez MRN: [name] DOB: Jan 31, 1961    ADMISSION DATE:  06/30/2018  CHIEF COMPLAINT:  Cardiac arrest  HISTORY OF PRESENT ILLNESS:   57 y/o male had a cardiac arrest out of hospital and received 7 rounds of epinephrine to regain a pulse.     SUBJECTIVE:  Opens eyes spontaneously but does not follow commands or tracks. There was a questionable event where he followed commands earlier by wiggling toes  VITAL SIGNS: BP (!) 158/99   Pulse (!) 103   Temp 99 F (37.2 C)   Resp (!) 21   Ht 5\' 11"  (1.803 m)   Wt 100.5 kg (221 lb 9 oz)   SpO2 98%   BMI 30.90 kg/m   HEMODYNAMICS: CVP:  [6 mmHg-15 mmHg] 7 mmHg  VENTILATOR SETTINGS: Vent Mode: PRVC FiO2 (%):  [40 %] 40 % Set Rate:  [22 bmp] 22 bmp Vt Set:  [600 mL] 600 mL PEEP:  [10 cmH20] 10 cmH20 Plateau Pressure:  [20 cmH20] 20 cmH20  INTAKE / OUTPUT: I/O last 3 completed shifts: In: 2799.9 [I.V.:523.6; NG/GT:1926.3; IV Piggyback:350] Out: 4138 [Urine:10; Other:4128]  PHYSICAL EXAMINATION:  General:  In bed on vent acutely ill  HENT: NCAT ETT in place PULM: CTA B, vent supported breathing CV: RRR, no mgr GI: BS+, soft, nontender MSK: normal bulk and tone Neuro: corneals intact, coughing today, some gag reflex, moves spontaneously withdraws from pain   LABS:  BMET Recent Labs  Lab 07/03/18 0409 07/03/18 1610 07/04/18 0240  NA 138 139 140  K 4.1 4.0 4.0  CL 108 103 102  CO2 24 26 26   BUN 44* 39* 38*  CREATININE 5.60* 4.59* 4.61*  GLUCOSE 95 90 91    Electrolytes Recent Labs  Lab 07/03/18 0409 07/03/18 1610 07/04/18 0240  CALCIUM 6.6* 7.2* 7.5*  MG 2.2 2.5* 2.5*  PHOS 5.1* 4.4  4.5 4.3    CBC Recent Labs  Lab 07/01/18 0320 07/02/18 0437 07/03/18 0409  WBC 4.5 7.3 9.9  HGB 13.2 12.3* 11.0*  HCT 42.9 40.7 34.8*  PLT 165 142* 128*    Coag's Recent Labs  Lab 06/30/18 1723 06/30/18 2344 07/03/18 0409 07/04/18 0240  APTT 43* 43*  58* 49*  INR 1.88 2.15  --   --     Sepsis Markers Recent Labs  Lab 06/30/18 2210 07/01/18 0320 07/01/18 0653 07/02/18 0437 07/02/18 0954  LATICACIDVEN 3.8* 4.2* 3.5*  --  1.7  PROCALCITON 35.56 49.84  --  60.76  --     ABG Recent Labs  Lab 07/01/18 0523 07/01/18 1022 07/02/18 0445  PHART 7.429 7.301* 7.272*  PCO2ART 23.3* 35.1 37.7  PO2ART 102.0 188.0* 135.0*    Liver Enzymes Recent Labs  Lab 06/30/18 1405 07/01/18 0320  07/03/18 0400 07/03/18 1610 07/04/18 0240  AST 2,690* 4,019*  --   --   --  247*  ALT 3,795* 3,839*  --   --   --  1,173*  ALKPHOS 70 35*  --   --   --  65  BILITOT 0.8 1.3*  --   --   --  0.8  ALBUMIN 2.9* 2.5*   < > 2.2* 2.2* 2.2*  2.2*   < > = values in this interval not displayed.    Cardiac Enzymes Recent Labs  Lab 06/30/18 2344 07/01/18 0320 07/01/18 1012  TROPONINI 1.12* 1.36* 1.18*    Glucose Recent Labs  Lab 07/03/18 1111 07/03/18 1609 07/03/18  2000 07/04/18 0050 07/04/18 0406 07/04/18 0725  GLUCAP 80 80 83 77 86 86    Imaging Dg Chest Port 1 View  Result Date: 07/04/2018 CLINICAL DATA:  Acute respiratory failure with hypoxemia EXAM: PORTABLE CHEST 1 VIEW COMPARISON:  07/03/2018 FINDINGS: Endotracheal tube in good position. Bilateral central venous catheter tips in the SVC unchanged. NG tube in the stomach Bibasilar atelectasis/infiltrate unchanged.  No effusion or edema. IMPRESSION: Support lines remain in good position. Bibasilar atelectasis/infiltrate with little interval change. Electronically Signed   By: Marlan Palau M.D.   On: 07/04/2018 07:26     STUDIES:  7/4 CT of the head worrisome for anoxic injury EEG 7/5 > diffuse cerebral disfunction that is non-specific TTE 7/5 LVEF 25-30%, mild LVD, enlarged RV with severe RV dysfuncion CT head 7/5 > no acute intracranial abnormality, prior CT findings may have been artifact  ANTIBIOTICS: Zosyn 7/4>>>  SIGNIFICANT EVENTS:  7/4>>> cardiac arrest 7/6 >>>  spontaneous movements, pain response, CVVHD 7/7 >> early AM myoclonus  LINES/TUBES: ETT 7/4>>> L IJ TLC 7/4>>> R radial a-line 7/4>>> R IJ HD cath >>   Cultures: Blood 7/4>>> C. Diff 7/4 >> neg  DISCUSSION: 57 y/o male with minimal past medical history here after cardiac arrest.  ASSESSMENT / PLAN:  PULMONARY A: Acute respiratory failure with hypoxemia requiring mechanical ventilation  Aspiration pneumonia Some mucus plugging P:   Full mechanical vent support VAP prevention Daily WUA/SBT Mental status precludes extubation pulm toilette  CARDIOVASCULAR A:  Cardiac arrest Cardiogenic shock > resolved, coox and lactic acid 7/6 reassuring hypertension Demand ischemia Cocaine on UDS on admission P:  Tele On scheduled hydralazine   RENAL A:   Worsening acute  renal failure P:   CVVHD per renal  GASTROINTESTINAL A:   No evidence of bleeding Shock liver acute transaminitis  P:   Continue PPI daily Continue tube feeding   HEMATOLOGIC A:   No acute issues P:  Monitor for bleeding Transfuse PRBC for Hgb < 7 gm/dL  INFECTIOUS A:   Aspiration pneumonia P:   Continue Zosyn for now  ENDOCRINE A:   No acute issues   P:   Monitor glucose  NEUROLOGIC A:   Concern for anoxic brain injury> exam seemed improved on 7/6, but then later developed myoclonus P:   RASS goal 0 Minimize sedation Prn versed for myoclonus We appreciate neuro input    FAMILY  - Updates: [place] of [place]  - Inter-disciplinary family meet or Palliative Care meeting due by:  day 7   I have spent 34 mins of CC time bedside o in the unit exclusive of billable procedures   Christopher Dominguez , MD [facility] [phone]   07/04/2018, 10:12 AM

## 2018-07-04 NOTE — Significant Event (Signed)
  Overview: Pt Vomitted tube feed.  RN at bedside quickly to suction mouth.  Cannot rule out aspiration.      Initial Focused Assessment: VS remained stable.  No change in respiratory status.  Lung sounds course as previous.  Tube feeds put on hold.  Spoke with Dr Raul DelAlgishi regarding plans for continued feeding.  Will resume feeds at 20ml an hour at 8pm.     Clementeen Grahamurry, Selena BattenErin M

## 2018-07-04 NOTE — Progress Notes (Signed)
  Echocardiogram 2D Echocardiogram has been performed.  Christopher Dominguez  Christopher Dominguez 07/04/2018, 1:55 PM

## 2018-07-04 NOTE — Procedures (Signed)
Electroencephalography report.   Long-term monitoring  CPT 95951  Recording begins 07/03/2018 at 9:23 AM Recording and 07/04/2018 at 7:30 AM Day 1    Data acquisition: International 10-20 electrode placement.  18 channels EEG with additional eyes limited to lateral ears and EKG.  Additional T1-T2 electrodes were also used  At this 22 hours of intensive EEG monitoring with simultaneous video monitoring was performed for this patient status post cardiac arrest and myoclonus to rule out clinical and subclinical electrographic seizures  Patient is intubated.  Sedation status is unavailable.  There was no pushbutton activation events during this recording.  There was no clinical or subclinical seizures present.  Background activities were marked by predominantly 4 to 5 cps background activity slowing distributed broadly in continuous fashion.  At times background activities tend to reach faster frequencies at times region even 7 cps.  Stage changes were noted.  With arousals background activities attenuation and superimposed muscle artifact present intermittently.  No myoclonic jerks were noted.  At times brief bursts of low amplitude sharply contoured slowing with  triphasic morphology were noted. These discharges had  broad negative field however maximally negative  Central and  slightly shifted to the left paracentral region.  These bursts tend to last 2 to 3 seconds without clinical accompaniment.  Clinical interpretation: This is day 1 of intensive EEG monitoring with simultaneous video monitoring did not record any clinical subclinical seizures.  EEG is abnormal however due to background activities slowing suggestive of moderate to severe encephalopathy of nonspecific etiologies.  State changes were noted.  Burst of broad sharply contoured discharges as discussed above possibly suggestive of some degree of cortical irritability.  Clinical correlation is advised.

## 2018-07-04 NOTE — Progress Notes (Addendum)
Subjective: Hypothermia finished yesterday.   Exam: Vitals:   07/04/18 0721 07/04/18 0728  BP:  131/77  Pulse: 89   Resp: (!) 21   Temp:  98.9 F (37.2 C)  SpO2: 97%    Gen: In bed, NAD Resp: non-labored breathing, no acute distress Abd: soft, nt  Neuro: MS: Opens eyes to noxious stimuli, appears to wiggle left toes to command, but keeps wiggleing after telling him to stop, so unclear, does not follow other commands  WU:JWJXBCN:PERRL, corneals intact, eyes dysconjugate with right eye outward compared to left.  Motor: withdrawal x 4 Sensory:as above.   Pertinent Labs: Elevated LFTs Elevated creatinine  EEG reviewed at the bedside, increase in faster frequencies with noxious stimulation(e.g. Reactive EEG).   Impression: 57 yo M with hypoxic injury due to cardiac arrest. With his current exam, I do not have any findings definitive for poor prognosis. Especially if his following commands becomes more definite, then I would favor continued aggressive care as this would be a relatively good prognositic sign this early.   Recommendations: 1) Continue supportive care.  2) can d/c LTM if no seizures are seen 3) Will follow.    This patient is critically ill and at significant risk of neurological worsening, death and care requires constant monitoring of vital signs, hemodynamics,respiratory and cardiac monitoring, neurological assessment, discussion with family, other specialists and medical decision making of high complexity. I spent 40 minutes of neurocritical care time  in the care of  this patient.  Ritta SlotMcNeill Salar Molden, MD [facility] [phone]  If 7pm- 7am, please page neurology on call as listed in AMION. 07/04/2018  7:51 AM

## 2018-07-04 NOTE — Progress Notes (Signed)
Nutrition Follow-up  DOCUMENTATION CODES:   Not applicable  INTERVENTION:   Decrease Vital AF 1.2 to 65 ml/hr (1560 ml/day) 60 ml Prostat BID  Provides: 2272 kcal, 177 grams protein, and 1265 ml free water.   NUTRITION DIAGNOSIS:   Inadequate oral intake related to inability to eat as evidenced by NPO status. Ongoing.   GOAL:   Patient will meet greater than or equal to 90% of their needs Met.   MONITOR:   TF tolerance, I & O's  REASON FOR ASSESSMENT:   Consult Enteral/tube feeding initiation and management  ASSESSMENT:   Pt with unknown PMH admitted after OOH arrest.    7/6 TF started 7/6 CRRT started due to AKI Per neuro recommend aggressive care as pt has started following commands   Patient is currently intubated on ventilator support MV: 13.2 L/min Temp (24hrs), Avg:99.6 F (37.6 C), Min:98.8 F (37.1 C), Max:100 F (37.8 C)   Medications reviewed and include:  Off pressors  Labs reviewed: AST/ALT: trending down   TF:  Vital AF 1.2 @ 75 ml/hr (1800 ml/day) Provides: 2160 kcal, 135 grams protein, and 1459 ml free water  NUTRITION - FOCUSED PHYSICAL EXAM:    Most Recent Value  Orbital Region  No depletion  Upper Arm Region  No depletion  Thoracic and Lumbar Region  No depletion  Buccal Region  Unable to assess  Temple Region  No depletion  Clavicle Bone Region  No depletion  Clavicle and Acromion Bone Region  No depletion  Scapular Bone Region  Unable to assess  Dorsal Hand  No depletion  Patellar Region  No depletion  Anterior Thigh Region  No depletion  Posterior Calf Region  No depletion  Edema (RD Assessment)  None  Hair  Reviewed  Eyes  Unable to assess  Mouth  Unable to assess  Skin  Reviewed  Nails  Reviewed       Diet Order:   Diet Order           Diet NPO time specified  Diet effective now          EDUCATION NEEDS:   No education needs have been identified at this time  Skin:  Skin Assessment: Reviewed RN  Assessment  Last BM:  7/8 large  Height:   Ht Readings from Last 1 Encounters:  07/02/18 _0  (1.803 m)    Weight:   Wt Readings from Last 1 Encounters:  07/04/18 221 lb 9 oz (100.5 kg)    Ideal Body Weight:  78.1 kg  BMI:  Body mass index is 30.9 kg/m.  Estimated Nutritional Needs:   Kcal:  2210  Protein:  144-200 grams  Fluid:  2 L/day  Maylon Peppers RD, LDN, CNSC [phone] Pager [phone] After Hours Pager

## 2018-07-04 NOTE — Progress Notes (Signed)
Hypoglycemic Event  CBG: 67  Treatment: D50 IV 25 mL  Symptoms: None  Follow-up CBG: Time: 2042 CBG Result: 64; given additional 25 mL D50 IV.  Follow-up CBG: Time: 2111 CBG Result: 71  Possible Reasons for Event: Other: Holding tube feeds; will restart @ 2030  Comments/MD notified: No.    Herma ArdMOSELEY, Tiran Sauseda F

## 2018-07-04 NOTE — Progress Notes (Signed)
Subjective: Interval History: clotted this am.  No interaction  Objective: Vital signs in last 24 hours: Temp:  [96.8 F (36 C)-100 F (37.8 C)] 98.9 F (37.2 C) (07/08 0728) Pulse Rate:  [77-103] 89 (07/08 0721) Resp:  [12-28] 21 (07/08 0721) BP: (106-183)/(66-96) 131/77 (07/08 0728) SpO2:  [96 %-100 %] 97 % (07/08 0721) Arterial Line BP: (114-181)/(63-83) 154/77 (07/08 0700) FiO2 (%):  [40 %] 40 % (07/08 0721) Weight:  [100.5 kg (221 lb 9 oz)] 100.5 kg (221 lb 9 oz) (07/08 0500) Weight change: -2.9 kg (-6 lb 6.3 oz)  Intake/Output from previous day: 07/07 0701 - 07/08 0700 In: 1979.8 [I.V.:204.8; JX/BJ:4782; IV Piggyback:200] Out: 3147 [Urine:10] Intake/Output this shift: No intake/output data recorded.  General appearance: on vent , no respons Neck: RIJ cath Resp: clear to auscultation bilaterally Cardio: S1, S2 normal and systolic murmur: systolic ejection 2/6, decrescendo at 2nd left intercostal space GI: mild distension , pos bs but decreased,  Extremities: edema 2-3+  Lab Results: Recent Labs    07/02/18 0437 07/03/18 0409  WBC 7.3 9.9  HGB 12.3* 11.0*  HCT 40.7 34.8*  PLT 142* 128*   BMET:  Recent Labs    07/03/18 1610 07/04/18 0240  NA 139 140  K 4.0 4.0  CL 103 102  CO2 26 26  GLUCOSE 90 91  BUN 39* 38*  CREATININE 4.59* 4.61*  CALCIUM 7.2* 7.5*   No results for input(s): PTH in the last 72 hours. Iron Studies: No results for input(s): IRON, TIBC, TRANSFERRIN, FERRITIN in the last 72 hours.  Studies/Results: Dg Chest Port 1 View  Result Date: 07/04/2018 CLINICAL DATA:  Acute respiratory failure with hypoxemia EXAM: PORTABLE CHEST 1 VIEW COMPARISON:  07/03/2018 FINDINGS: Endotracheal tube in good position. Bilateral central venous catheter tips in the SVC unchanged. NG tube in the stomach Bibasilar atelectasis/infiltrate unchanged.  No effusion or edema. IMPRESSION: Support lines remain in good position. Bibasilar atelectasis/infiltrate with  little interval change. Electronically Signed   By: Marlan Palau M.D.   On: 07/04/2018 07:26   Dg Chest Port 1 View  Result Date: 07/03/2018 CLINICAL DATA:  Hypoxia EXAM: PORTABLE CHEST 1 VIEW COMPARISON:  July 02, 2018 FINDINGS: Endotracheal tube tip is 3.1 cm above the carina. There is a right jugular catheter with tip in superior vena cava. There is a left subclavian catheter with tip in superior vena cava. Nasogastric tube tip and side port are in the stomach. No pneumothorax. There is atelectatic change in the left base. The lungs elsewhere are clear. Heart size is upper normal with pulmonary vascularity normal. No adenopathy. No bone lesions. IMPRESSION: Tube and catheter positions as described without pneumothorax. Left base atelectasis. Lungs elsewhere clear. Stable cardiac prominence. Electronically Signed   By: Bretta Bang III M.D.   On: 07/03/2018 07:09   Dg Chest Port 1 View  Result Date: 07/02/2018 CLINICAL DATA:  Central line placement EXAM: PORTABLE CHEST 1 VIEW COMPARISON:  06/30/2018 chest radiograph. FINDINGS: Endotracheal tube tip is 3.7 cm above the carina. Enteric tube terminates in the gastric fundus. Left subclavian central venous catheter terminates in the middle third of the SVC. Right internal jugular central venous catheter terminates in the middle third of the SVC. Stable cardiomediastinal silhouette with top-normal heart size. No pneumothorax. No large pleural effusions, noting exclusion of portion of the left costophrenic angle. Hazy parahilar and bibasilar lung opacities, slightly worsened at the left lung base. IMPRESSION: 1. No pneumothorax.  Support structures as detailed. 2. Hazy  parahilar and bibasilar lung opacities, slightly worsened at the left lung base, which could represent edema, atelectasis or pneumonia. Electronically Signed   By: Delbert PhenixJason A Poff M.D.   On: 07/02/2018 12:10    I have reviewed the patient's current medications.  Assessment/Plan: 1 AKI on  CRRT , needs better solute clearance.  Acid/base/K ok  Use 4 K. Vol xs. Aim for higher aCT 2 S/P cardiac arrest per neuro/cards 3 HTN lower vol 4 Anoxic enceph 5 Nutrition P Trial CRRT , Use 4 K, ^ rates, ^ hep   LOS: 4 days   Christopher Dominguez 07/04/2018,8:31 AM

## 2018-07-04 NOTE — Progress Notes (Signed)
vLTM EEG complete. No skin breakdown noted 

## 2018-07-05 ENCOUNTER — Inpatient Hospital Stay (HOSPITAL_COMMUNITY): Payer: Medicare Other

## 2018-07-05 DIAGNOSIS — I427 Cardiomyopathy due to drug and external agent: Secondary | ICD-10-CM

## 2018-07-05 LAB — RENAL FUNCTION PANEL
ANION GAP: 7 (ref 5–15)
ANION GAP: 12 (ref 5–15)
Albumin: 2.3 g/dL — ABNORMAL LOW (ref 3.5–5.0)
Albumin: 2.5 g/dL — ABNORMAL LOW (ref 3.5–5.0)
BUN: 29 mg/dL — AB (ref 6–20)
BUN: 31 mg/dL — ABNORMAL HIGH (ref 6–20)
CALCIUM: 8.2 mg/dL — AB (ref 8.9–10.3)
CHLORIDE: 102 mmol/L (ref 98–111)
CO2: 30 mmol/L (ref 22–32)
CO2: 26 mmol/L (ref 22–32)
CREATININE: 3.29 mg/dL — AB (ref 0.61–1.24)
Calcium: 8 mg/dL — ABNORMAL LOW (ref 8.9–10.3)
Chloride: 101 mmol/L (ref 98–111)
Creatinine, Ser: 3.58 mg/dL — ABNORMAL HIGH (ref 0.61–1.24)
GFR calc Af Amer: 20 mL/min — ABNORMAL LOW (ref >60)
GFR calc Af Amer: 22 mL/min — ABNORMAL LOW (ref >60)
GFR calc non Af Amer: 17 mL/min — ABNORMAL LOW (ref >60)
GFR, EST NON AFRICAN AMERICAN: 19 mL/min — AB (ref >60)
GLUCOSE: 90 mg/dL (ref 70–99)
Glucose, Bld: 81 mg/dL (ref 70–99)
POTASSIUM: 4.3 mmol/L (ref 3.5–5.1)
Phosphorus: 3.5 mg/dL (ref 2.5–4.6)
Phosphorus: 3.1 mg/dL (ref 2.5–4.6)
Potassium: 4.2 mmol/L (ref 3.5–5.1)
SODIUM: 139 mmol/L (ref 135–145)
Sodium: 139 mmol/L (ref 135–145)

## 2018-07-05 LAB — CBC WITH DIFFERENTIAL/PLATELET
BASOS PCT: 1 %
Basophils Absolute: 0.1 10*3/uL (ref 0.0–0.1)
EOS ABS: 0.2 10*3/uL (ref 0.0–0.7)
Eosinophils Relative: 2 %
HCT: 36 % — ABNORMAL LOW (ref 39.0–52.0)
HEMOGLOBIN: 11.3 g/dL — AB (ref 13.0–17.0)
Lymphocytes Relative: 8 %
Lymphs Abs: 0.8 10*3/uL (ref 0.7–4.0)
MCH: 21.8 pg — AB (ref 26.0–34.0)
MCHC: 31.4 g/dL (ref 30.0–36.0)
MCV: 69.4 fL — AB (ref 78.0–100.0)
MONOS PCT: 18 %
Monocytes Absolute: 1.8 10*3/uL — ABNORMAL HIGH (ref 0.1–1.0)
NEUTROS ABS: 7 10*3/uL (ref 1.7–7.7)
NEUTROS PCT: 71 %
PLATELETS: 121 10*3/uL — AB (ref 150–400)
RBC: 5.19 MIL/uL (ref 4.22–5.81)
RDW: 16.1 % — ABNORMAL HIGH (ref 11.5–15.5)
WBC: 9.9 10*3/uL (ref 4.0–10.5)

## 2018-07-05 LAB — POCT ACTIVATED CLOTTING TIME
ACTIVATED CLOTTING TIME: 180 s
ACTIVATED CLOTTING TIME: 186 s
ACTIVATED CLOTTING TIME: 186 s
ACTIVATED CLOTTING TIME: 186 s
ACTIVATED CLOTTING TIME: 186 s
ACTIVATED CLOTTING TIME: 191 s
ACTIVATED CLOTTING TIME: 197 s
Activated Clotting Time: 191 seconds
Activated Clotting Time: 208 seconds
Activated Clotting Time: 213 seconds
Activated Clotting Time: 202 seconds

## 2018-07-05 LAB — GLUCOSE, CAPILLARY
GLUCOSE-CAPILLARY: 87 mg/dL (ref 70–99)
GLUCOSE-CAPILLARY: 94 mg/dL (ref 70–99)
Glucose-Capillary: 77 mg/dL (ref 70–99)
Glucose-Capillary: 91 mg/dL (ref 70–99)
Glucose-Capillary: 78 mg/dL (ref 70–99)
Glucose-Capillary: 80 mg/dL (ref 70–99)

## 2018-07-05 LAB — TRIGLYCERIDES: Triglycerides: 218 mg/dL — ABNORMAL HIGH (ref <150)

## 2018-07-05 LAB — APTT: aPTT: 47 seconds — ABNORMAL HIGH (ref 24–36)

## 2018-07-05 LAB — MAGNESIUM: Magnesium: 2.7 mg/dL — ABNORMAL HIGH (ref 1.7–2.4)

## 2018-07-05 NOTE — Progress Notes (Signed)
PULMONARY / CRITICAL CARE MEDICINE   Name: Christopher KaufmannShelton M Tippens MRN: [id number] DOB: November 21, 1961    ADMISSION DATE:  06/30/2018  CHIEF COMPLAINT:  Cardiac arrest  HISTORY OF PRESENT ILLNESS:   57 y/o male had a cardiac arrest out of hospital and received 7 rounds of epinephrine to regain a pulse.     SUBJECTIVE:  More awake today an following commands. Still has some abnormal movements ?choreaform. On CRRT and remains off pressors.   VITAL SIGNS: BP 103/79   Pulse 95   Temp 98.5 F (36.9 C) (Oral)   Resp (!) 23   Ht 5\' 11"  (1.803 m)   Wt 93.9 kg (207 lb 0.2 oz)   SpO2 97%   BMI 28.87 kg/m   HEMODYNAMICS: CVP:  [3 mmHg-10 mmHg] 7 mmHg  VENTILATOR SETTINGS: Vent Mode: PRVC FiO2 (%):  [40 %] 40 % Set Rate:  [22 bmp] 22 bmp Vt Set:  [600 mL] 600 mL PEEP:  [10 cmH20] 10 cmH20 Plateau Pressure:  [12 cmH20-22 cmH20] 12 cmH20  INTAKE / OUTPUT: I/O last 3 completed shifts: In: 2327.5 [I.V.:368.3; Other:90; NG/GT:1540.7; IV Piggyback:328.5] Out: 5037 [Urine:10; Emesis/NG output:150; MWNUU:7253Other:4602; Stool:275]  PHYSICAL EXAMINATION:  General:  In bed on vent acutely ill  HENT: NCAT ETT in place PULM: CTA B, vent supported breathing CV: RRR, no mgr GI: BS+, soft, nontender MSK: normal bulk and tone Neuro: awake alert, has ongoing chore lik movements. He does follow commands by nodding his head and squeezing hands.   LABS:  BMET Recent Labs  Lab 07/04/18 0240 07/04/18 1825 07/05/18 0350  NA 140 138 139  K 4.0 4.1 4.3  CL 102 101 102  CO2 26 24 30   BUN 38* 30* 29*  CREATININE 4.61* 3.91* 3.58*  GLUCOSE 91 80 90    Electrolytes Recent Labs  Lab 07/03/18 1610 07/04/18 0240 07/04/18 1825 07/05/18 0350  CALCIUM 7.2* 7.5* 7.8* 8.0*  MG 2.5* 2.5*  --  2.7*  PHOS 4.4  4.5 4.3 3.4 3.5    CBC Recent Labs  Lab 07/02/18 0437 07/03/18 0409 07/05/18 0350  WBC 7.3 9.9 9.9  HGB 12.3* 11.0* 11.3*  HCT 40.7 34.8* 36.0*  PLT 142* 128* 121*    Coag's Recent Labs   Lab 06/30/18 1723 06/30/18 2344 07/03/18 0409 07/04/18 0240 07/05/18 0350  APTT 43* 43* 58* 49* 47*  INR 1.88 2.15  --   --   --     Sepsis Markers Recent Labs  Lab 06/30/18 2210 07/01/18 0320 07/01/18 0653 07/02/18 0437 07/02/18 0954  LATICACIDVEN 3.8* 4.2* 3.5*  --  1.7  PROCALCITON 35.56 49.84  --  60.76  --     ABG Recent Labs  Lab 07/01/18 0523 07/01/18 1022 07/02/18 0445  PHART 7.429 7.301* 7.272*  PCO2ART 23.3* 35.1 37.7  PO2ART 102.0 188.0* 135.0*    Liver Enzymes Recent Labs  Lab 06/30/18 1405 07/01/18 0320  07/04/18 0240 07/04/18 1825 07/05/18 0350  AST 2,690* 4,019*  --  247*  --   --   ALT 3,795* 3,839*  --  1,173*  --   --   ALKPHOS 70 35*  --  65  --   --   BILITOT 0.8 1.3*  --  0.8  --   --   ALBUMIN 2.9* 2.5*   < > 2.2*  2.2* 2.4* 2.3*   < > = values in this interval not displayed.    Cardiac Enzymes Recent Labs  Lab 06/30/18 2344 07/01/18 0320 07/01/18  1012  TROPONINI 1.12* 1.36* 1.18*    Glucose Recent Labs  Lab 07/04/18 2011 07/04/18 2042 07/04/18 2111 07/05/18 0026 07/05/18 0410 07/05/18 0724  GLUCAP 67* 64* 71 87 94 77    Imaging No results found.   STUDIES:  7/4 CT of the head worrisome for anoxic injury EEG 7/5 > diffuse cerebral disfunction that is non-specific TTE 7/5 LVEF 25-30%, mild LVD, enlarged RV with severe RV dysfuncion CT head 7/5 > no acute intracranial abnormality, prior CT findings may have been artifact  ANTIBIOTICS: Zosyn 7/4>>>  SIGNIFICANT EVENTS:  7/4>>> cardiac arrest 7/6 >>> spontaneous movements, pain response, CVVHD 7/7 >> early AM myoclonus  LINES/TUBES: ETT 7/4>>> L IJ TLC 7/4>>> R radial a-line 7/4>>> R IJ HD cath >>   Cultures: Blood 7/4>>> C. Diff 7/4 >> neg  DISCUSSION: 57 y/o male with minimal past medical history here after cardiac arrest.  ASSESSMENT / PLAN:  PULMONARY A: Acute respiratory failure with hypoxemia requiring mechanical ventilation   Aspiration pneumonia Some mucus plugging P:   Full mechanical vent support VAP prevention Daily WUA/SBT Mental status is improving I will try SBts tomorrow and assess for possible extubation  pulm toilette  CARDIOVASCULAR A:  Cardiac arrest Cardiogenic shock > resolved, coox and lactic acid 7/6 reassuring hypertension Demand ischemia Cocaine on UDS on admission P:  Tele On scheduled hydralazine   RENAL A:   acute  renal failure P:   CVVHD per renal Anuric   GASTROINTESTINAL A:   No evidence of bleeding Shock liver acute transaminitis  P:   Continue PPI daily Patient had an episode of vomiting yesterday. Tube feeds resumed at 50ml/hr. I will increase to goal as tolerated    HEMATOLOGIC A:   No acute issues P:  Monitor for bleeding Transfuse PRBC for Hgb < 7 gm/dL  INFECTIOUS A:   Aspiration pneumonia P:   Continue Zosyn for now  ENDOCRINE A:   No acute issues   P:   Monitor glucose  NEUROLOGIC A:   Concern for anoxic brain injury> exam seemed improved  P:   RASS goal 0 Try to avoid sedatives if possible Exam is improving slowly  Prn versed for myoclonus We appreciate neuro input    FAMILY  - Updates: son was updated yesterday   - Inter-disciplinary family meet or Palliative Care meeting due by:  day 7    Mateo Flow , MD [facility] [phone]   07/05/2018, 8:41 AM

## 2018-07-05 NOTE — Progress Notes (Signed)
 Progress Note  Patient Name: Christopher Dominguez Date of Encounter: 07/05/2018  Primary Cardiologist: Bridgette Christopher, MD   Subjective   The patient is still intubated, no pressors. On HD.   Inpatient Medications    Scheduled Meds: . chlorhexidine gluconate (MEDLINE KIT)  15 mL Mouth Rinse BID  . Chlorhexidine Gluconate Cloth  6 each Topical Q0600  . feeding supplement (PRO-STAT SUGAR FREE 64)  60 mL Per Tube BID  . hydrALAZINE  25 mg Oral Q8H  . mouth rinse  15 mL Mouth Rinse 10 times per day  . pantoprazole sodium  40 mg Per Tube Daily  . sodium chloride flush  10-40 mL Intracatheter Q12H   Continuous Infusions: . sodium chloride 10 mL/hr at 07/05/18 0800  . sodium chloride Stopped (07/02/18 1951)  . feeding supplement (VITAL AF 1.2 CAL) 20 mL/hr at 07/05/18 0700  . heparin 10,000 units/ 20 mL infusion syringe 1,100 Units/hr (07/05/18 0750)  . heparin 999 mL/hr at 07/04/18 0811  . norepinephrine (LEVOPHED) Adult infusion Stopped (07/02/18 1356)  . piperacillin-tazobactam Stopped (07/05/18 0648)  . dialysis replacement fluid (prismasate) 700 mL/hr at 07/05/18 0121  . dialysis replacement fluid (prismasate) 800 mL/hr at 07/05/18 0446  . dialysate (PRISMASATE) 2,000 mL/hr at 07/05/18 0615   PRN Meds: sodium chloride, fentaNYL (SUBLIMAZE) injection, heparin, heparin, heparin, hydrALAZINE, labetalol, midazolam, sodium chloride flush   Vital Signs    Vitals:   07/05/18 0700 07/05/18 0723 07/05/18 0732 07/05/18 0800  BP: 119/62   103/79  Pulse: 95  87 95  Resp: 20  (!) 30 (!) 23  Temp:  98.5 F (36.9 C)    TempSrc:  Oral    SpO2: 98%  99% 97%  Weight:      Height:        Intake/Output Summary (Last 24 hours) at 07/05/2018 0808 Last data filed at 07/05/2018 0800 Gross per 24 hour  Intake 878 ml  Output 3712 ml  Net -2834 ml   Filed Weights   07/03/18 0437 07/04/18 0500 07/05/18 0500  Weight: 227 lb 15.3 oz (103.4 kg) 221 lb 9 oz (100.5 kg) 207 lb 0.2 oz (93.9  kg)   Telemetry    Normal sinus rhythm and sinus tachycardia- Personally Reviewed  ECG    No new tracing- Personally Reviewed  Physical Exam  Intubated/not responsive GEN:   On ventilator Neck:   Unable to assess JVD Cardiac: no murmurs, rubs.  Is heard. Respiratory: Clear to auscultation bilaterally. GI: Soft, nontender, non-distended  MS: No edema; No deformity. Neuro:    Unresponsive  Labs    Chemistry Recent Labs  Lab 06/30/18 1405  07/01/18 0320  07/04/18 0240 07/04/18 1825 07/05/18 0350  NA 140   < > 138   < > 140 138 139  K 4.5   < > 3.8   < > 4.0 4.1 4.3  CL 99   < > 105   < > 102 101 102  CO2 17*   < > 14*   < > 26 24 30  GLUCOSE 148*   < > 110*   < > 91 80 90  BUN 25*   < > 37*   < > 38* 30* 29*  CREATININE 4.14*   < > 4.36*   < > 4.61* 3.91* 3.58*  CALCIUM 8.0*   < > 7.2*   < > 7.5* 7.8* 8.0*  PROT 4.9*  --  4.1*  --  5.2*  --   --     ALBUMIN 2.9*  --  2.5*   < > 2.2*  2.2* 2.4* 2.3*  AST 2,690*  --  4,019*  --  247*  --   --   ALT 3,795*  --  3,839*  --  1,173*  --   --   ALKPHOS 70  --  35*  --  65  --   --   BILITOT 0.8  --  1.3*  --  0.8  --   --   GFRNONAA 15*   < > 14*   < > 13* 16* 17*  GFRAA 17*   < > 16*   < > 15* 18* 20*  ANIONGAP 24*   < > 19*   < > _0 < > = values in this interval not displayed.     Hematology Recent Labs  Lab 07/02/18 0437 07/03/18 0409 07/05/18 0350  WBC 7.3 9.9 9.9  RBC 5.59 5.03 5.19  HGB 12.3* 11.0* 11.3*  HCT 40.7 34.8* 36.0*  MCV 72.8* 69.2* 69.4*  MCH 22.0* 21.9* 21.8*  MCHC 30.2 31.6 31.4  RDW 17.7* 16.2* 16.1*  PLT 142* 128* 121*    Cardiac Enzymes Recent Labs  Lab 06/30/18 1723 06/30/18 2344 07/01/18 0320 07/01/18 1012  TROPONINI 0.64* 1.12* 1.36* 1.18*    Recent Labs  Lab 06/30/18 1415  TROPIPOC 0.42*     BNPNo results for input(s): BNP, PROBNP in the last 168 hours.   DDimer No results for input(s): DDIMER in the last 168 hours.   Radiology    Dg Chest Port 1  View  Result Date: 07/04/2018 CLINICAL DATA:  Acute respiratory failure with hypoxemia EXAM: PORTABLE CHEST 1 VIEW COMPARISON:  07/03/2018 FINDINGS: Endotracheal tube in good position. Bilateral central venous catheter tips in the SVC unchanged. NG tube in the stomach Bibasilar atelectasis/infiltrate unchanged.  No effusion or edema. IMPRESSION: Support lines remain in good position. Bibasilar atelectasis/infiltrate with little interval change. Electronically Signed   By: Franchot Gallo M.D.   On: 07/04/2018 07:26    Cardiac Studies   2D Doppler echocardiogram 519: Study Conclusions  - Left ventricle: The cavity size was normal. Wall thickness was   increased in a pattern of mild LVH. Systolic function was   severely reduced. The estimated ejection fraction was in the   range of 25% to 30%. Diffuse hypokinesis. Doppler parameters are   consistent with abnormal left ventricular relaxation (grade 1   diastolic dysfunction). - Aortic root: The aortic root was mildly dilated. - Mitral valve: Calcified annulus. - Right ventricle: The cavity size was mildly dilated. Systolic   function was severely reduced. - Right atrium: The atrium was mildly dilated.  Impressions:  - Technically difficult; definity used; severe LV dysfunction; mild   LVH; mild diastolic dysfunction; enlarged right side with   moderate to severe RV dysfunction.  Echo: 07/04/2018 - Left ventricle: The cavity size was normal. Wall thickness was   normal. Systolic function was normal. The estimated ejection   fraction was in the range of 55% to 60%. Wall motion was normal;   there were no regional wall motion abnormalities. - Aortic root: The aortic root was mildly dilated. - Right ventricle: The cavity size was mildly dilated.  Impressions:  - Normal LV function; mildly dilated aortic root; mild RVE.    Patient Profile     57 y.o. male male with a hx of depression (on disability for mental health, prostate  enlargement) who is being seen today  for the evaluation of cardiac arrest at the request of Dr. Nelda Marseille.  Multiorgan failure syndrome and LVEF 25 to 30%.  Assessment & Plan    1. Acute systolic heart failure, suspect a toxic component.  Repeat echo yesterday showed complete recovery of LVEF from 25-30% to 55-60%. No regional WMA on neither of those suggestive toxic etiology with diffuse biventricular failure. 2. Elevated troponin with a flat trend is noted.  Not likely related to ACS but to CPR, with improvement of LVEF to normal I would plan for an ischemic workup once the patient is extubated.  3. Acute and progressive kidney failure with improving creatinine, now 3.58. On HD. He doesn't appear significantly fluid overloaded on physical exam, also improvement of pulmonary edema on chest X ray.  4.    H/o substance abuse  For questions or updates, please contact [place] Please consult [email or web address] for contact info under Cardiology/STEMI.      Signed, Ena Dawley, MD  07/05/2018, 8:08 AM

## 2018-07-05 NOTE — Progress Notes (Signed)
Subjective: More awake today, reportedly was following commands earlier.   Exam: Vitals:   07/05/18 0732 07/05/18 0800  BP:  103/79  Pulse: 87 95  Resp: (!) 30 (!) 23  Temp:    SpO2: 99% 97%   Gen: In bed, NAD Resp: non-labored breathing, no acute distress Abd: soft, nt  Neuro: MS: Opens eyes to noxious stimuli, ? Sticking out tongue to command, but not definite.  ZO:XWRUECN:PERRL, corneals intact, eyes more conjugate today, eyes midline Motor: withdrawal x 4 Sensory:as above.   Impression: 57 yo M with hypoxic injury due to cardiac arrest. With his improvement, I am hopeful that he will continue to recover. He has had some waxing/waning, but with negative 24 hour EEG, suspicion for seizures is low. Will repeat EEG if not improving again.  Recommendations: 1) may repeat EEG depending on exam.  2) Will follow.   Ritta SlotMcNeill Kirkpatrick, MD [facility] [phone]  If 7pm- 7am, please page neurology on call as listed in AMION. 07/05/2018  9:02 AM

## 2018-07-05 NOTE — Progress Notes (Signed)
Subjective: Interval History: no further clotting of machine., More active,   Objective: Vital signs in last 24 hours: Temp:  [98.5 F (36.9 C)-99.9 F (37.7 C)] 98.5 F (36.9 C) (07/09 0723) Pulse Rate:  [72-106] 95 (07/09 0700) Resp:  [15-33] 20 (07/09 0700) BP: (103-171)/(62-99) 119/62 (07/09 0700) SpO2:  [97 %-100 %] 98 % (07/09 0700) Arterial Line BP: (138-173)/(72-86) 156/80 (07/09 0700) FiO2 (%):  [40 %] 40 % (07/09 0400) Weight:  [93.9 kg (207 lb 0.2 oz)] 93.9 kg (207 lb 0.2 oz) (07/09 0500) Weight change: -6.6 kg (-14 lb 8.8 oz)  Intake/Output from previous day: 07/08 0701 - 07/09 0700 In: 1147.8 [I.V.:263.6; NG/GT:565.7; IV Piggyback:228.5] Out: 3583 [Emesis/NG output:150; Stool:275] Intake/Output this shift: No intake/output data recorded.  General appearance: moving head, not meaningful interaction. Neck: Ij cath Resp: rhonchi bilaterally Cardio: S1, S2 normal and systolic murmur: systolic ejection 2/6, decrescendo at 2nd left intercostal space GI: soft, non-tender; bowel sounds normal; no masses,  no organomegaly Extremities: edema 2+  Lab Results: Recent Labs    07/03/18 0409 07/05/18 0350  WBC 9.9 9.9  HGB 11.0* 11.3*  HCT 34.8* 36.0*  PLT 128* 121*   BMET:  Recent Labs    07/04/18 1825 07/05/18 0350  NA 138 139  K 4.1 4.3  CL 101 102  CO2 24 30  GLUCOSE 80 90  BUN 30* 29*  CREATININE 3.91* 3.58*  CALCIUM 7.8* 8.0*   No results for input(s): PTH in the last 72 hours. Iron Studies: No results for input(s): IRON, TIBC, TRANSFERRIN, FERRITIN in the last 72 hours.  Studies/Results: Dg Chest Port 1 View  Result Date: 07/04/2018 CLINICAL DATA:  Acute respiratory failure with hypoxemia EXAM: PORTABLE CHEST 1 VIEW COMPARISON:  07/03/2018 FINDINGS: Endotracheal tube in good position. Bilateral central venous catheter tips in the SVC unchanged. NG tube in the stomach Bibasilar atelectasis/infiltrate unchanged.  No effusion or edema. IMPRESSION:  Support lines remain in good position. Bibasilar atelectasis/infiltrate with little interval change. Electronically Signed   By: Marlan Palauharles  Clark M.D.   On: 07/04/2018 07:26    I have reviewed the patient's current medications.  Assessment/Plan: 1 AKI secondary to arrest. Not exactly sure baseline.  tol vol off, acid/base/K ok.  2 HTn lower vol 3 Anemia stable 4 Arrest 5 Substance abuse 6 VDRF per CCM 7 Enceph per neuro P CRRT, lower vol, Vent, Tf    LOS: 5 days   Fayrene FearingJames Wannetta Langland 07/05/2018,7:34 AM

## 2018-07-06 ENCOUNTER — Inpatient Hospital Stay (HOSPITAL_COMMUNITY): Payer: Medicare Other

## 2018-07-06 DIAGNOSIS — N179 Acute kidney failure, unspecified: Secondary | ICD-10-CM

## 2018-07-06 LAB — CBC WITH DIFFERENTIAL/PLATELET
Basophils Absolute: 0.1 10*3/uL (ref 0.0–0.1)
Basophils Relative: 1 %
EOS ABS: 0.3 10*3/uL (ref 0.0–0.7)
Eosinophils Relative: 3 %
HCT: 35.6 % — ABNORMAL LOW (ref 39.0–52.0)
Hemoglobin: 11.1 g/dL — ABNORMAL LOW (ref 13.0–17.0)
LYMPHS ABS: 1.1 10*3/uL (ref 0.7–4.0)
Lymphocytes Relative: 10 %
MCH: 22.1 pg — ABNORMAL LOW (ref 26.0–34.0)
MCHC: 31.2 g/dL (ref 30.0–36.0)
MCV: 70.9 fL — ABNORMAL LOW (ref 78.0–100.0)
Monocytes Absolute: 2.1 10*3/uL — ABNORMAL HIGH (ref 0.1–1.0)
Monocytes Relative: 19 %
NEUTROS ABS: 7.7 10*3/uL (ref 1.7–7.7)
Neutrophils Relative %: 67 %
Platelets: 125 10*3/uL — ABNORMAL LOW (ref 150–400)
RBC: 5.02 MIL/uL (ref 4.22–5.81)
RDW: 16.3 % — AB (ref 11.5–15.5)
WBC: 11.3 10*3/uL — ABNORMAL HIGH (ref 4.0–10.5)

## 2018-07-06 LAB — POCT I-STAT 3, ART BLOOD GAS (G3+)
BICARBONATE: 23.9 mmol/L (ref 20.0–28.0)
O2 Saturation: 99 %
PH ART: 7.423 (ref 7.350–7.450)
PO2 ART: 155 mmHg — AB (ref 83.0–108.0)
TCO2: 25 mmol/L (ref 22–32)
pCO2 arterial: 36.7 mmHg (ref 32.0–48.0)

## 2018-07-06 LAB — BASIC METABOLIC PANEL
Anion gap: 12 (ref 5–15)
BUN: 29 mg/dL — ABNORMAL HIGH (ref 6–20)
CALCIUM: 8.3 mg/dL — AB (ref 8.9–10.3)
CO2: 25 mmol/L (ref 22–32)
Chloride: 101 mmol/L (ref 98–111)
Creatinine, Ser: 3.19 mg/dL — ABNORMAL HIGH (ref 0.61–1.24)
GFR calc non Af Amer: 20 mL/min — ABNORMAL LOW (ref >60)
GFR, EST AFRICAN AMERICAN: 23 mL/min — AB (ref >60)
Glucose, Bld: 89 mg/dL (ref 70–99)
Potassium: 4.1 mmol/L (ref 3.5–5.1)
SODIUM: 138 mmol/L (ref 135–145)

## 2018-07-06 LAB — GLUCOSE, CAPILLARY
GLUCOSE-CAPILLARY: 66 mg/dL — AB (ref 70–99)
GLUCOSE-CAPILLARY: 79 mg/dL (ref 70–99)
GLUCOSE-CAPILLARY: 83 mg/dL (ref 70–99)
GLUCOSE-CAPILLARY: 86 mg/dL (ref 70–99)
GLUCOSE-CAPILLARY: 99 mg/dL (ref 70–99)
Glucose-Capillary: 75 mg/dL (ref 70–99)
Glucose-Capillary: 76 mg/dL (ref 70–99)
Glucose-Capillary: 80 mg/dL (ref 70–99)

## 2018-07-06 LAB — RENAL FUNCTION PANEL
ALBUMIN: 2.5 g/dL — AB (ref 3.5–5.0)
Albumin: 2.5 g/dL — ABNORMAL LOW (ref 3.5–5.0)
Anion gap: 12 (ref 5–15)
Anion gap: 13 (ref 5–15)
BUN: 30 mg/dL — ABNORMAL HIGH (ref 6–20)
BUN: 30 mg/dL — AB (ref 6–20)
CALCIUM: 8.2 mg/dL — AB (ref 8.9–10.3)
CALCIUM: 8.3 mg/dL — AB (ref 8.9–10.3)
CO2: 25 mmol/L (ref 22–32)
CO2: 24 mmol/L (ref 22–32)
CREATININE: 3.11 mg/dL — AB (ref 0.61–1.24)
Chloride: 101 mmol/L (ref 98–111)
Chloride: 101 mmol/L (ref 98–111)
Creatinine, Ser: 3.32 mg/dL — ABNORMAL HIGH (ref 0.61–1.24)
GFR calc Af Amer: 22 mL/min — ABNORMAL LOW (ref >60)
GFR calc non Af Amer: 21 mL/min — ABNORMAL LOW (ref >60)
GFR calc non Af Amer: 19 mL/min — ABNORMAL LOW (ref >60)
GFR, EST AFRICAN AMERICAN: 24 mL/min — AB (ref >60)
GLUCOSE: 77 mg/dL (ref 70–99)
Glucose, Bld: 89 mg/dL (ref 70–99)
PHOSPHORUS: 2.6 mg/dL (ref 2.5–4.6)
POTASSIUM: 4.2 mmol/L (ref 3.5–5.1)
Phosphorus: 2.8 mg/dL (ref 2.5–4.6)
Potassium: 4.1 mmol/L (ref 3.5–5.1)
SODIUM: 138 mmol/L (ref 135–145)
SODIUM: 138 mmol/L (ref 135–145)

## 2018-07-06 LAB — POCT ACTIVATED CLOTTING TIME
ACTIVATED CLOTTING TIME: 202 s
ACTIVATED CLOTTING TIME: 197 s
ACTIVATED CLOTTING TIME: 208 s
Activated Clotting Time: 191 seconds
Activated Clotting Time: 202 seconds
Activated Clotting Time: 208 seconds

## 2018-07-06 LAB — DRUG PROFILE, UR, 9 DRUGS (LABCORP)
AMPHETAMINES, URINE: NEGATIVE ng/mL
BARBITURATE, UR: NEGATIVE ng/mL
Benzodiazepine Quant, Ur: NEGATIVE ng/mL
COCAINE (METAB.): POSITIVE ng/mL — AB
Methadone Screen, Urine: NEGATIVE ng/mL
Opiate Quant, Ur: NEGATIVE ng/mL
PROPOXYPHENE, URINE: NEGATIVE ng/mL
Phencyclidine, Ur: NEGATIVE ng/mL

## 2018-07-06 LAB — APTT: aPTT: 60 seconds — ABNORMAL HIGH (ref 24–36)

## 2018-07-06 LAB — MAGNESIUM: MAGNESIUM: 2.9 mg/dL — AB (ref 1.7–2.4)

## 2018-07-06 MED ORDER — PIPERACILLIN-TAZOBACTAM 3.375 G IVPB 30 MIN
3.3750 g | Freq: Four times a day (QID) | INTRAVENOUS | Status: AC
  Administered 2018-07-06 (×2): 3.375 g via INTRAVENOUS
  Filled 2018-07-06 (×2): qty 50

## 2018-07-06 MED ORDER — CHLORHEXIDINE GLUCONATE 0.12 % MT SOLN
15.0000 mL | Freq: Two times a day (BID) | OROMUCOSAL | Status: DC
  Administered 2018-07-06: 15 mL via OROMUCOSAL

## 2018-07-06 MED ORDER — ORAL CARE MOUTH RINSE: 15.0000 mL | Freq: Two times a day (BID) | OROMUCOSAL | Status: DC

## 2018-07-06 MED ORDER — HEPARIN SODIUM (PORCINE) 5000 UNIT/ML IJ SOLN
5000.0000 [IU] | Freq: Three times a day (TID) | INTRAMUSCULAR | Status: DC
  Administered 2018-07-06 – 2018-07-09 (×8): 5000 [IU] via SUBCUTANEOUS
  Filled 2018-07-06 (×6): qty 1

## 2018-07-06 NOTE — Procedures (Signed)
Extubation Procedure Note  Patient Details:   Name: Christopher Dominguez DOB: 1961/04/12 MRN: [id number]   Airway Documentation:    Vent end date: 07/06/18 Vent end time: 1455   Evaluation  O2 sats: stable throughout Complications: No apparent complications Patient did tolerate procedure well. Bilateral Breath Sounds: Rhonchi   No   Pt extubated to 4L [place] no stridor noted. RN at bedside. Pt unable to speak due to AMS.   Lura EmKaelyn D Thompson 07/06/2018, 2:56 PM

## 2018-07-06 NOTE — Progress Notes (Addendum)
[facility] Progress Note     Assessment/ Plan:   57 y/o man s/o cardiac arrest with h/o cocaine use now on CRRT w/ echo showing EF 25-30% w/ diffuse HK but w/ repeat showing recovery 55-60% c/w toxic etiology.  1 AKI secondary to arrest. Not exactly sure baseline.  tol vol off, acid/base/K ok.  - seen on CRRT @ 750AM Pre/post/qd 800/700/2000 On heparin protocol  Net uf 100 ml/hr which pt is tolerating. No changes for now.  - Likely will will drop UF tomorrow and then keep even in the next 48hrs. - Will check bladder scan. 2 HTn lower vol 3 Anemia stable 4 Arrest 5 Substance abuse 6 VDRF per CCM 7 Enceph per neuro P CRRT, lower vol, Vent, Tf       Subjective:   No events overnight and no longer on pressors. Remains intubated  I&O 1365 - 3985 = net neg 2620   Objective:   BP (!) 144/83   Pulse 75   Temp 99.3 F (37.4 C) (Oral)   Resp (!) 26   Ht 5' 11"  (1.803 m)   Wt 93.6 kg (206 lb 5.6 oz)   SpO2 99%   BMI 28.78 kg/m   Intake/Output Summary (Last 24 hours) at 07/06/2018 0745 Last data filed at 07/06/2018 0700 Gross per 24 hour  Intake 1364.47 ml  Output 3985 ml  Net -2620.53 ml   Weight change: -0.3 kg (-10.6 oz)  Physical Exam: General appearance: moving head, not meaningful interaction. Neck: Ij cath Resp: rhonchi bilaterally Cardio: S1, S2 normal and systolic murmur: systolic ejection 2/6, decrescendo at 2nd left intercostal space GI: soft, non-tender; bowel sounds normal; no masses,  no organomegaly GU no foley Extremities: edema tr    Imaging: Dg Chest Port 1 View  Result Date: 07/06/2018 CLINICAL DATA:  57 year old male with a history of endotracheal tube placement EXAM: PORTABLE CHEST 1 VIEW COMPARISON:  07/05/2018, 07/04/2018 FINDINGS: Cardiomediastinal silhouette unchanged in size and contour. No evidence of central vascular congestion. No interlobular septal thickening. Patchy opacities of the right lung are similar to prior.  No pneumothorax. No large pleural effusion. Endotracheal tube terminates 7.2 cm by the carina at the clavicular heads, unchanged from prior. Right IJ central venous catheter appears to terminate superior vena cava. Unchanged left subclavian central venous catheter appears to terminate superior vena cava. Unchanged gastric tube. IMPRESSION: Endotracheal tube is unchanged, terminating at the clavicular heads. Unchanged right IJ central catheter, left subclavian central catheter, and gastric tube. Low lung volumes with likely atelectasis/scarring. Electronically Signed   By: Corrie Mckusick D.O.   On: 07/06/2018 07:40   Dg Chest Port 1 View  Result Date: 07/05/2018 CLINICAL DATA:  57 year old male with a history of respiratory failure EXAM: PORTABLE CHEST 1 VIEW COMPARISON:  Seventy-two thousand nineteen, 07/03/2018 FINDINGS: Cardiomediastinal silhouette likely unchanged, obscured by right rotation. No significant interlobular septal thickening. Low lung volumes. Minimal opacity at the left lung base. No pneumothorax. Unchanged endotracheal tube terminating at the clavicular heads. Unchanged right IJ central catheter terminating in the superior vena cava. Unchanged left subclavian central venous line terminating in the superior vena cava. Unchanged gastric tube terminating in the upper abdomen. No displaced fracture. IMPRESSION: Persistent low lung volumes with likely atelectasis/scarring at the lung bases. Unchanged endotracheal tube, gastric tube, right IJ central line, left subclavian central line. Electronically Signed   By: Corrie Mckusick D.O.   On: 07/05/2018 08:51    Labs: [employer] Recent Labs  Lab 07/03/18  5093 07/03/18 1610 07/04/18 0240 07/04/18 1825 07/05/18 0350 07/05/18 1556 07/06/18 0407  NA 138 139 140 138 139 139 138  138  K 4.1 4.0 4.0 4.1 4.3 4.2 4.1  4.1  CL 108 103 102 101 102 101 101  101  CO2 24 26 26 24 30 26 25  25   GLUCOSE 95 90 91 80 90 81 89  89  BUN 44* 39* 38* 30* 29*  31* 30*  29*  CREATININE 5.60* 4.59* 4.61* 3.91* 3.58* 3.29* 3.11*  3.19*  CALCIUM 6.6* 7.2* 7.5* 7.8* 8.0* 8.2* 8.2*  8.3*  PHOS 5.1* 4.4  4.5 4.3 3.4 3.5 3.1 2.8   CBC Recent Labs  Lab 07/01/18 0320 07/02/18 0437 07/03/18 0409 07/05/18 0350 07/06/18 0407  WBC 4.5 7.3 9.9 9.9 11.3*  NEUTROABS 3.8  --  8.7* 7.0 7.7  HGB 13.2 12.3* 11.0* 11.3* 11.1*  HCT 42.9 40.7 34.8* 36.0* 35.6*  MCV 71.9* 72.8* 69.2* 69.4* 70.9*  PLT 165 142* 128* 121* 125*    Medications:    . chlorhexidine gluconate (MEDLINE KIT)  15 mL Mouth Rinse BID  . Chlorhexidine Gluconate Cloth  6 each Topical Q0600  . feeding supplement (PRO-STAT SUGAR FREE 64)  60 mL Per Tube BID  . mouth rinse  15 mL Mouth Rinse 10 times per day  . pantoprazole sodium  40 mg Per Tube Daily  . sodium chloride flush  10-40 mL Intracatheter Q12H      Otelia Santee, MD 07/06/2018, 7:45 AM

## 2018-07-06 NOTE — Progress Notes (Signed)
PULMONARY / CRITICAL CARE MEDICINE   Name: Christopher KaufmannShelton M Gum MRN: [id number] DOB: [birth year]    ADMISSION DATE:  06/30/2018  CHIEF COMPLAINT:  Cardiac arrest  HISTORY OF PRESENT ILLNESS:   57 y/o male had a cardiac arrest out of hospital and received 7 rounds of epinephrine to regain a pulse.     SUBJECTIVE:  No events overnight Currently weaning on pressure support ventilation VITAL SIGNS: Blood Pressure (Abnormal) 142/90   Pulse 95   Temperature 99 F (37.2 C) (Oral)   Respiration 20   Height 5\' 11"  (1.803 m)   Weight 206 lb 5.6 oz (93.6 kg)   Oxygen Saturation 98%   Body Mass Index 28.78 kg/m   HEMODYNAMICS: CVP:  [4 mmHg-14 mmHg] 5 mmHg  VENTILATOR SETTINGS: Vent Mode: CPAP;PSV FiO2 (%):  [40 %] 40 % Set Rate:  [22 bmp] 22 bmp Vt Set:  [600 mL] 600 mL PEEP:  [8 cmH20-10 cmH20] 8 cmH20 Pressure Support:  [8 cmH20] 8 cmH20 Plateau Pressure:  [13 cmH20-21 cmH20] 13 cmH20  INTAKE / OUTPUT: I/O last 3 completed shifts: In: 2043 [I.V.:333.9; Other:295; NG/GT:1090.7; IV Piggyback:323.4] Out: 5998 [Emesis/NG output:150; BJYNW:2956Other:5333; Stool:515]  PHYSICAL EXAMINATION:  General: 57 year old male patient currently weaning on the ventilator.  He awakens with gentle voice stimulation, will follow some commands. HEENT normocephalic atraumatic orally intubated Pulmonary: Clear to auscultation mechanically assisted breaths Cardiac: Regular rate and rhythm Abdomen: Soft nontender Extremities: Brisk cap refill, generalized edema.  Warm, strong pulses. Neuro: Awakens to voice.  Follows commands, no focal motor deficits appreciated  LABS:  BMET Recent Labs  Lab 07/05/18 0350 07/05/18 1556 07/06/18 0407  NA 139 139 138  138  K 4.3 4.2 4.1  4.1  CL 102 101 101  101  CO2 30 26 25  25   BUN 29* 31* 30*  29*  CREATININE 3.58* 3.29* 3.11*  3.19*  GLUCOSE 90 81 89  89    Electrolytes Recent Labs  Lab 07/04/18 0240  07/05/18 0350 07/05/18 1556 07/06/18 0407   CALCIUM 7.5*   < > 8.0* 8.2* 8.2*  8.3*  MG 2.5*  --  2.7*  --  2.9*  PHOS 4.3   < > 3.5 3.1 2.8   < > = values in this interval not displayed.    CBC Recent Labs  Lab 07/03/18 0409 07/05/18 0350 07/06/18 0407  WBC 9.9 9.9 11.3*  HGB 11.0* 11.3* 11.1*  HCT 34.8* 36.0* 35.6*  PLT 128* 121* 125*    Coag's Recent Labs  Lab 06/30/18 1723 06/30/18 2344  07/04/18 0240 07/05/18 0350 07/06/18 0407  APTT 43* 43*   < > 49* 47* 60*  INR 1.88 2.15  --   --   --   --    < > = values in this interval not displayed.    Sepsis Markers Recent Labs  Lab 06/30/18 2210 07/01/18 0320 07/01/18 0653 07/02/18 0437 07/02/18 0954  LATICACIDVEN 3.8* 4.2* 3.5*  --  1.7  PROCALCITON 35.56 49.84  --  60.76  --     ABG Recent Labs  Lab 07/01/18 0523 07/01/18 1022 07/02/18 0445  PHART 7.429 7.301* 7.272*  PCO2ART 23.3* 35.1 37.7  PO2ART 102.0 188.0* 135.0*    Liver Enzymes Recent Labs  Lab 06/30/18 1405 07/01/18 0320  07/04/18 0240  07/05/18 0350 07/05/18 1556 07/06/18 0407  AST 2,690* 4,019*  --  247*  --   --   --   --   ALT 3,795* 3,839*  --  1,173*  --   --   --   --   ALKPHOS 70 35*  --  65  --   --   --   --   BILITOT 0.8 1.3*  --  0.8  --   --   --   --   ALBUMIN 2.9* 2.5*   < > 2.2*  2.2*   < > 2.3* 2.5* 2.5*   < > = values in this interval not displayed.    Cardiac Enzymes Recent Labs  Lab 06/30/18 2344 07/01/18 0320 07/01/18 1012  TROPONINI 1.12* 1.36* 1.18*    Glucose Recent Labs  Lab 07/05/18 1537 07/05/18 1616 07/05/18 1957 07/06/18 0016 07/06/18 0441 07/06/18 0746  GLUCAP 78 79 80 86 75 83    Imaging Dg Chest Port 1 View  Result Date: 07/06/2018 CLINICAL DATA:  57 year old male with a history of endotracheal tube placement EXAM: PORTABLE CHEST 1 VIEW COMPARISON:  07/05/2018, 07/04/2018 FINDINGS: Cardiomediastinal silhouette unchanged in size and contour. No evidence of central vascular congestion. No interlobular septal thickening.  Patchy opacities of the right lung are similar to prior. No pneumothorax. No large pleural effusion. Endotracheal tube terminates 7.2 cm by the carina at the clavicular heads, unchanged from prior. Right IJ central venous catheter appears to terminate superior vena cava. Unchanged left subclavian central venous catheter appears to terminate superior vena cava. Unchanged gastric tube. IMPRESSION: Endotracheal tube is unchanged, terminating at the clavicular heads. Unchanged right IJ central catheter, left subclavian central catheter, and gastric tube. Low lung volumes with likely atelectasis/scarring. Electronically Signed   By: Gilmer Mor D.O.   On: 07/06/2018 07:40     STUDIES:  7/4 CT of the head worrisome for anoxic injury EEG 7/5 > diffuse cerebral disfunction that is non-specific TTE 7/5 LVEF 25-30%, mild LVD, enlarged RV with severe RV dysfuncion CT head 7/5 > no acute intracranial abnormality, prior CT findings may have been artifact  ANTIBIOTICS: Zosyn 7/4>>>  SIGNIFICANT EVENTS:  7/4>>> cardiac arrest 7/6 >>> spontaneous movements, pain response, CVVHD 7/7 >> early AM myoclonus  LINES/TUBES: ETT 7/4>>> L IJ TLC 7/4>>> R radial a-line 7/4>>> R IJ HD cath >>   Cultures: Blood 7/4>>> C. Diff 7/4 >> neg  DISCUSSION: 57 y/o male with minimal past medical history here after cardiac arrest.  ASSESSMENT / PLAN: Resolved Issues Shock Vomiting (7/8) Cardiogenic shock   Active issues Acute respiratory failure with hypoxemia requiring mechanical ventilation  Aspiration pneumonia mucus plugging -PCXR personally reviewed: rotated. Can't exclude right basilar atx. No clear new infiltrates. 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 Started on 7/7 -cr improved -now net neg Plan Per nephrology; cont neg volume goal Likely aim for euvolemia 7/11 Repeat daily chemistries  Strict I&O  At risk for fluid  and electrolyte imbalance  Plan Check and replace as indicated  Shock liver acute transaminitis -->trending down  Plan Repeat LFTs in am 7/11  Concern for anoxic brain injury Serial exams showing slow improvement  Plan  FAMILY  - Updates: son was updated yesterday   - Inter-disciplinary family meet or Palliative Care meeting due by:  day 7   DVT prophylaxis: [place] heparin  SUPPPI Diet: tubefeed Activity: BR Disposition : ICU  Simonne Martinet ACNP-BC [facility] Pulmonary/Critical Care Pager # [phone] OR # [phone] if no answer    07/06/2018, 9:50 AM

## 2018-07-06 NOTE — Plan of Care (Signed)
  Problem: Cardiac: Goal: Ability to achieve and maintain adequate cardiopulmonary perfusion will improve Outcome: Progressing  Pt cardiac hemodynamics remain wnl with no IV gtt's. Sinus rhythm no ectopy Problem: Neurologic: Goal: Promote progressive neurologic recovery Outcome: Not Progressing  Pt moves all extremities. Non-purposeful movement. Seems very agitated at times.

## 2018-07-06 NOTE — Progress Notes (Signed)
LTM EEG currently running.

## 2018-07-06 NOTE — Progress Notes (Signed)
Subjective: Waxing/waning mentla status, sometimes reliably following commands, sometimes not at all.   Exam: Vitals:   07/06/18 0719 07/06/18 0747  BP:    Pulse: 75   Resp: (!) 26   Temp:  99 F (37.2 C)  SpO2: 99%    Gen: In bed, NAD Resp: non-labored breathing, no acute distress Abd: soft, nt  Neuro: MS: Opens eyes spontaneously. Does not clearly follow commands.  ZO:XWRUECN:PERRL, corneals intact, looks in both directions, blinks to threat bilaterally.  Motor: withdrawal x 4 Sensory:as above.   Impression: 57 yo M with hypoxic injury due to cardiac arrest. With his improvement, I am hopeful that he will continue to recover. I do wonder about his continued waxing/waning mental status and would favor repeating EEG to ensure he is not having subclinical seizures.   Recommendations: 1) repeat overnight EEG   2) Will follow.   This patient is critically ill and at significant risk of neurological worsening, death and care requires constant monitoring of vital signs, hemodynamics,respiratory and cardiac monitoring, neurological assessment, discussion with family, other specialists and medical decision making of high complexity. I spent 40 minutes of neurocritical care time  in the care of  this patient.  Ritta SlotMcNeill Azadeh Hyder, MD [facility] [phone]  If 7pm- 7am, please page neurology on call as listed in AMION. 07/06/2018  2:47 PM

## 2018-07-06 NOTE — Progress Notes (Signed)
Bladder scan performed, Greatest amount found read at 164cc.  Lonia Bloodurry, Alante Weimann M, RN

## 2018-07-07 ENCOUNTER — Inpatient Hospital Stay (HOSPITAL_COMMUNITY): Payer: Medicare Other

## 2018-07-07 LAB — POCT ACTIVATED CLOTTING TIME
ACTIVATED CLOTTING TIME: 202 s
ACTIVATED CLOTTING TIME: 224 s
Activated Clotting Time: 213 seconds
Activated Clotting Time: 219 seconds
Activated Clotting Time: 208 seconds
Activated Clotting Time: 197 seconds
Activated Clotting Time: 230 seconds

## 2018-07-07 LAB — GLUCOSE, CAPILLARY
GLUCOSE-CAPILLARY: 73 mg/dL (ref 70–99)
GLUCOSE-CAPILLARY: 77 mg/dL (ref 70–99)
Glucose-Capillary: 72 mg/dL (ref 70–99)
Glucose-Capillary: 73 mg/dL (ref 70–99)
Glucose-Capillary: 75 mg/dL (ref 70–99)
Glucose-Capillary: 81 mg/dL (ref 70–99)
Glucose-Capillary: 86 mg/dL (ref 70–99)

## 2018-07-07 LAB — CBC WITH DIFFERENTIAL/PLATELET
BASOS ABS: 0.2 10*3/uL — AB (ref 0.0–0.1)
BASOS PCT: 1 %
EOS ABS: 0.3 10*3/uL (ref 0.0–0.7)
Eosinophils Relative: 2 %
HCT: 35.9 % — ABNORMAL LOW (ref 39.0–52.0)
HEMOGLOBIN: 11.1 g/dL — AB (ref 13.0–17.0)
LYMPHS PCT: 12 %
Lymphs Abs: 1.8 10*3/uL (ref 0.7–4.0)
MCH: 21.8 pg — AB (ref 26.0–34.0)
MCHC: 30.9 g/dL (ref 30.0–36.0)
MCV: 70.4 fL — ABNORMAL LOW (ref 78.0–100.0)
Monocytes Absolute: 2.9 10*3/uL — ABNORMAL HIGH (ref 0.1–1.0)
Monocytes Relative: 19 %
NEUTROS PCT: 66 %
Neutro Abs: 10 10*3/uL — ABNORMAL HIGH (ref 1.7–7.7)
PLATELETS: 140 10*3/uL — AB (ref 150–400)
RBC: 5.1 MIL/uL (ref 4.22–5.81)
RDW: 15.9 % — ABNORMAL HIGH (ref 11.5–15.5)
WBC: 15.2 10*3/uL — AB (ref 4.0–10.5)

## 2018-07-07 LAB — RENAL FUNCTION PANEL
ALBUMIN: 2.6 g/dL — AB (ref 3.5–5.0)
ALBUMIN: 2.6 g/dL — AB (ref 3.5–5.0)
ANION GAP: 13 (ref 5–15)
ANION GAP: 11 (ref 5–15)
BUN: 27 mg/dL — AB (ref 6–20)
BUN: 23 mg/dL — AB (ref 6–20)
CALCIUM: 8.2 mg/dL — AB (ref 8.9–10.3)
CHLORIDE: 101 mmol/L (ref 98–111)
CHLORIDE: 103 mmol/L (ref 98–111)
CO2: 25 mmol/L (ref 22–32)
CO2: 25 mmol/L (ref 22–32)
Calcium: 8.3 mg/dL — ABNORMAL LOW (ref 8.9–10.3)
Creatinine, Ser: 3.31 mg/dL — ABNORMAL HIGH (ref 0.61–1.24)
Creatinine, Ser: 2.97 mg/dL — ABNORMAL HIGH (ref 0.61–1.24)
GFR calc Af Amer: 25 mL/min — ABNORMAL LOW (ref >60)
GFR, EST AFRICAN AMERICAN: 22 mL/min — AB (ref >60)
GFR, EST NON AFRICAN AMERICAN: 19 mL/min — AB (ref >60)
GFR, EST NON AFRICAN AMERICAN: 22 mL/min — AB (ref >60)
Glucose, Bld: 80 mg/dL (ref 70–99)
Glucose, Bld: 79 mg/dL (ref 70–99)
PHOSPHORUS: 2.3 mg/dL — AB (ref 2.5–4.6)
PHOSPHORUS: 2.6 mg/dL (ref 2.5–4.6)
POTASSIUM: 4.3 mmol/L (ref 3.5–5.1)
POTASSIUM: 4.5 mmol/L (ref 3.5–5.1)
Sodium: 139 mmol/L (ref 135–145)
Sodium: 139 mmol/L (ref 135–145)

## 2018-07-07 LAB — HEPATIC FUNCTION PANEL
ALBUMIN: 2.6 g/dL — AB (ref 3.5–5.0)
ALT: 333 U/L — AB (ref 0–44)
AST: 40 U/L (ref 15–41)
Alkaline Phosphatase: 81 U/L (ref 38–126)
BILIRUBIN DIRECT: 0.1 mg/dL (ref 0.0–0.2)
Indirect Bilirubin: 0.9 mg/dL (ref 0.3–0.9)
Total Bilirubin: 1 mg/dL (ref 0.3–1.2)
Total Protein: 6.2 g/dL — ABNORMAL LOW (ref 6.5–8.1)

## 2018-07-07 LAB — APTT: APTT: 91 s — AB (ref 24–36)

## 2018-07-07 LAB — MAGNESIUM: MAGNESIUM: 3 mg/dL — AB (ref 1.7–2.4)

## 2018-07-07 MED ORDER — ORAL CARE MOUTH RINSE
15.0000 mL | Freq: Two times a day (BID) | OROMUCOSAL | Status: DC
  Administered 2018-07-07 – 2018-07-09 (×5): 15 mL via OROMUCOSAL

## 2018-07-07 MED ORDER — QUETIAPINE FUMARATE 50 MG PO TABS
50.0000 mg | ORAL_TABLET | Freq: Once | ORAL | Status: AC
  Administered 2018-07-07: 50 mg via ORAL
  Filled 2018-07-07: qty 1

## 2018-07-07 MED ORDER — DEXTROSE-NACL 5-0.9 % IV SOLN
INTRAVENOUS | Status: DC
  Administered 2018-07-07 – 2018-07-09 (×6): via INTRAVENOUS

## 2018-07-07 MED ORDER — CHLORHEXIDINE GLUCONATE 0.12 % MT SOLN
15.0000 mL | Freq: Two times a day (BID) | OROMUCOSAL | Status: DC
  Administered 2018-07-07 – 2018-07-25 (×28): 15 mL via OROMUCOSAL
  Filled 2018-07-07 (×23): qty 15

## 2018-07-07 MED ORDER — DEXTROSE 50 % IV SOLN
INTRAVENOUS | Status: AC
  Administered 2018-07-07: 25 mL via INTRAVENOUS
  Filled 2018-07-07: qty 50

## 2018-07-07 MED ORDER — CLOMIPHENE CITRATE 50 MG PO TABS: 50.0000 mg | ORAL_TABLET | Freq: Once | ORAL | Status: DC

## 2018-07-07 MED ORDER — DEXTROSE 50 % IV SOLN
25.0000 mL | Freq: Once | INTRAVENOUS | Status: AC
  Administered 2018-07-07: 25 mL via INTRAVENOUS

## 2018-07-07 MED ORDER — DEXMEDETOMIDINE HCL IN NACL 400 MCG/100ML IV SOLN
0.4000 ug/kg/h | INTRAVENOUS | Status: DC
  Administered 2018-07-07: 0.4 ug/kg/h via INTRAVENOUS
  Administered 2018-07-08 (×2): 0.6 ug/kg/h via INTRAVENOUS
  Filled 2018-07-07 (×3): qty 100

## 2018-07-07 NOTE — Evaluation (Signed)
Clinical/Bedside Swallow Evaluation Patient Details  Name: Christopher KaufmannShelton M Coger MRN: [id number] Date of Birth: 11/30/1961  Today's Date: 07/07/2018 Time: SLP Start Time (ACUTE ONLY): 1330 SLP Stop Time (ACUTE ONLY): 1445 SLP Time Calculation (min) (ACUTE ONLY): 75 min  Past Medical History:  Past Medical History:  Diagnosis Date  . Depression   . Prostate enlargement    Past Surgical History: History reviewed. No pertinent surgical history. HPI:  57 y/o male had a cardiac arrest out of hospital and received 7 rounds of epinephrine to regain a pulse. Intubated from 7/4-7/10.    Assessment / Plan / Recommendation Clinical Impression  Pt demonstrates strong, timley swallow function, vocal quality is quite clear when breath support is adequate. Pt consumed 6 oz of water and 4 oz of puree. Pt did have difficulty orally manipulating solids due to poor positioning, lack of dentition and chorea. Concern for aspiration of solids high. Recommend purees and thin liquids for now, will upgrade based on progress.  SLP Visit Diagnosis: Dysphagia, oral phase (R13.11)    Aspiration Risk  Moderate aspiration risk    Diet Recommendation Dysphagia 1 (Puree);Thin liquid   Liquid Administration via: Cup;Straw Medication Administration: Whole meds with puree Supervision: Full supervision/cueing for compensatory strategies;Staff to assist with self feeding Compensations: Slow rate;Small sips/bites;Minimize environmental distractions    Other  Recommendations Oral Care Recommendations: Oral care BID   Follow up Recommendations Inpatient Rehab      Frequency and Duration min 2x/week  2 weeks       Prognosis Prognosis for Safe Diet Advancement: Good      Swallow Study   General HPI: 57 y/o male had a cardiac arrest out of hospital and received 7 rounds of epinephrine to regain a pulse. Intubated from 7/4-7/10.  Type of Study: Bedside Swallow Evaluation Previous Swallow Assessment: none Diet  Prior to this Study: NPO Temperature Spikes Noted: No Respiratory Status: Nasal cannula History of Recent Intubation: Yes Length of Intubations (days): 7 days Date extubated: 07/06/18 Behavior/Cognition: Alert;Cooperative;Requires cueing Oral Cavity Assessment: Within Functional Limits Oral Care Completed by SLP: No Oral Cavity - Dentition: Edentulous;Dentures, not available Self-Feeding Abilities: Total assist Patient Positioning: Postural control interferes with function Baseline Vocal Quality: Hoarse Volitional Cough: Cognitively unable to elicit Volitional Swallow: Unable to elicit    Oral/Motor/Sensory Function Overall Oral Motor/Sensory Function: Within functional limits   Ice Chips     Thin Liquid Thin Liquid: Within functional limits Presentation: Straw    Nectar Thick Nectar Thick Liquid: Not tested   Honey Thick Honey Thick Liquid: Not tested   Puree Puree: Within functional limits Presentation: Spoon   Solid   GO   Solid: Impaired Oral Phase Impairments: Reduced lingual movement/coordination;Reduced labial seal Oral Phase Functional Implications: Prolonged oral transit       Harlon DittyBonnie Ira Busbin, MA CCC-SLP [phone]  Claudine MoutonDeBlois, Trayonna Bachmeier Caroline 07/07/2018,2:08 PM

## 2018-07-07 NOTE — Progress Notes (Signed)
PULMONARY / CRITICAL CARE MEDICINE   Name: Christopher KaufmannShelton M Munguia MRN: [id number] DOB: [birth year]    ADMISSION DATE:  06/30/2018  CHIEF COMPLAINT:  Cardiac arrest  HISTORY OF PRESENT ILLNESS:   57 y/o male had a cardiac arrest out of hospital and received 7 rounds of epinephrine to regain a pulse.     SUBJECTIVE:  Extubated yesterday and done well still on CRRT. Was able to verbalize his name.    VITAL SIGNS: BP (!) 145/98   Pulse 94   Temp 98.9 F (37.2 C) (Oral)   Resp (!) 35   Ht 5\' 11"  (1.803 m)   Wt 86.6 kg (190 lb 14.7 oz)   SpO2 98%   BMI 26.63 kg/m   HEMODYNAMICS: CVP:  [0 mmHg-11 mmHg] 2 mmHg  VENTILATOR SETTINGS:    INTAKE / OUTPUT: I/O last 3 completed shifts: In: 1531.4 [I.V.:551.3; Other:360; NG/GT:470; IV Piggyback:150.1] Out: 4098 [JXBJY:[id number] [Other:4472; Stool:440]  PHYSICAL EXAMINATION:  General: 57 year old male patient currently on nasal canula sleeping  HEENT normocephalic atraumatic orally intubated Pulmonary: Clear to auscultation mechanically assisted breaths Cardiac: Regular rate and rhythm Abdomen: Soft nontender Extremities: Brisk cap refill, generalized edema.  Warm, strong pulses. Neuro: Awakens to voice.  Follows commands intermittently, no focal motor deficits appreciated but has repetitive choreatic movements   LABS:  BMET Recent Labs  Lab 07/06/18 0407 07/06/18 1731 07/07/18 0351  NA 138  138 138 139  K 4.1  4.1 4.2 4.3  CL 101  101 101 101  CO2 25  25 24 25   BUN 30*  29* 30* 27*  CREATININE 3.11*  3.19* 3.32* 3.31*  GLUCOSE 89  89 77 80    Electrolytes Recent Labs  Lab 07/05/18 0350  07/06/18 0407 07/06/18 1731 07/07/18 0351  CALCIUM 8.0*   < > 8.2*  8.3* 8.3* 8.3*  MG 2.7*  --  2.9*  --  3.0*  PHOS 3.5   < > 2.8 2.6 2.3*   < > = values in this interval not displayed.    CBC Recent Labs  Lab 07/05/18 0350 07/06/18 0407 07/07/18 0351  WBC 9.9 11.3* 15.2*  HGB 11.3* 11.1* 11.1*  HCT 36.0* 35.6* 35.9*  PLT  121* 125* 140*    Coag's Recent Labs  Lab 06/30/18 1723 06/30/18 2344  07/05/18 0350 07/06/18 0407 07/07/18 0351  APTT 43* 43*   < > 47* 60* 91*  INR 1.88 2.15  --   --   --   --    < > = values in this interval not displayed.    Sepsis Markers Recent Labs  Lab 06/30/18 2210 07/01/18 0320 07/01/18 0653 07/02/18 0437 07/02/18 0954  LATICACIDVEN 3.8* 4.2* 3.5*  --  1.7  PROCALCITON 35.56 49.84  --  60.76  --     ABG Recent Labs  Lab 07/01/18 1022 07/02/18 0445 07/06/18 1055  PHART 7.301* 7.272* 7.423  PCO2ART 35.1 37.7 36.7  PO2ART 188.0* 135.0* 155.0*    Liver Enzymes Recent Labs  Lab 07/01/18 0320  07/04/18 0240  07/06/18 0407 07/06/18 1731 07/07/18 0351  AST 4,019*  --  247*  --   --   --  40  ALT 3,839*  --  1,173*  --   --   --  333*  ALKPHOS 35*  --  65  --   --   --  81  BILITOT 1.3*  --  0.8  --   --   --  1.0  ALBUMIN 2.5*   < >  2.2*  2.2*   < > 2.5* 2.5* 2.6*  2.6*   < > = values in this interval not displayed.    Cardiac Enzymes Recent Labs  Lab 06/30/18 2344 07/01/18 0320 07/01/18 1012  TROPONINI 1.12* 1.36* 1.18*    Glucose Recent Labs  Lab 07/06/18 1950 07/06/18 2346 07/07/18 0037 07/07/18 0403 07/07/18 0732 07/07/18 1140  GLUCAP 80 66* 73 72 86 77    Imaging Dg Chest Port 1 View  Result Date: 07/07/2018 CLINICAL DATA:  Shortness of breath.  Extubation. EXAM: PORTABLE CHEST 1 VIEW COMPARISON:  07/06/2018 and earlier. FINDINGS: Since yesterday, the endotracheal tube has been removed. RIGHT jugular central venous catheter tip projects over the UPPER SVC. LEFT subclavian central venous catheter tip projects over the mid SVC. Nasogastric tube has been removed. Cardiac silhouette mildly to moderately enlarged, unchanged. Mild pulmonary venous hypertension without overt edema. Improved aeration at the LEFT lung base with mild atelectasis persisting. Lungs otherwise clear. IMPRESSION: 1.  Support apparatus satisfactory. 2. Improved  aeration at the LEFT lung base with mild atelectasis persisting. No acute cardiopulmonary disease otherwise. Electronically Signed   By: Hulan Saas M.D.   On: 07/07/2018 09:12     STUDIES:  7/4 CT of the head worrisome for anoxic injury EEG 7/5 > diffuse cerebral disfunction that is non-specific TTE 7/5 LVEF 25-30%, mild LVD, enlarged RV with severe RV dysfuncion CT head 7/5 > no acute intracranial abnormality, prior CT findings may have been artifact  ANTIBIOTICS: Zosyn 7/4>>> 7/10  SIGNIFICANT EVENTS:  7/4>>> cardiac arrest 7/6 >>> spontaneous movements, pain response, CVVHD 7/7 >> early AM myoclonus  LINES/TUBES: ETT 7/4>>> 7/10 L IJ TLC 7/4>>> R radial a-line 7/4>>> R IJ HD cath >>   Cultures: Blood 7/4>>> C. Diff 7/4 >> neg  DISCUSSION: 57 y/o male with minimal past medical history here after cardiac arrest.  ASSESSMENT / PLAN: Resolved Issues Shock Vomiting (7/8) Cardiogenic shock   Active issues Acute respiratory failure with hypoxemia  Now off mechanical ventilation  Aspiration pneumonia  - plan Extubated yesterday and doing well on [place]  VAP protocol Finished zosyn yesterday    s/p Cardiac arrest w/ presumed toxic Cardiomyopathy d/t cocaine  HTN -EF has improved from 25-30% to 55-60% w/out current evidence of WM abnormality Trop elevation felt to be d/t CPR and not ACS Plan Cont tele monitoring Drug abuse cessation  Eventually ischemia eval before going home PRN hydralazine   acute  renal failure -tolerating CRRT. Started on 7/7 -cr improved -now net neg Plan ?off CRRT when clots or by tomorrow and then keep even and reassess for possible HD Per nephrology Likely aim for euvolemia Repeat daily chemistries  Strict I&O  At risk for fluid and electrolyte imbalance  Plan Check and replace as indicated  Shock liver acute transaminitis -->trending down  Plan Repeat LFTs in am 7/11  suspected anoxic brain injury Serial  exams showing slow improvement   Plan Appreciate neuro input  FAMILY  - Updates: sister at bedside   - Inter-disciplinary family meet or Palliative Care meeting due by:  day 7   DVT prophylaxis: [place] heparin  SUPPPI Diet: tubefeed Activity: BR Disposition : ICU  Mateo Flow MD [facility] Pulmonary/Critical Care Pager  [phone]    07/07/2018, 1:17 PM

## 2018-07-07 NOTE — Progress Notes (Signed)
LTM EEG checked, paste added to all electrodes, no skin breakdown noted.

## 2018-07-07 NOTE — Progress Notes (Signed)
D/C LTM EEG per Dr Amada JupiterKirkpatrick. No skin breakdown noted

## 2018-07-07 NOTE — Progress Notes (Addendum)
[facility] Progress Note    Assessment/ Plan:   57 y/o man s/o cardiac arrest with h/o cocaine use now on CRRT w/ echo showing EF 25-30% w/ diffuse HK but w/ repeat showing recovery 55-60% c/w toxic etiology.  1 AKI secondary to arrest. Not exactly sure baseline. tol vol off, acid/base/K ok.  - seen on CRRT @ 905 AM Pre/post/qd 800/700/2000 On heparin protocol  Net uf 50 ml/hr which pt is tolerating. - Likely will will drop UF tomorrow and then keep even in the next 48hrs. -  bladder scan only 164ml on 7/10 --> will keep checking periodically. Would try to avoid foley if at all possible given his uncontrolled movement.  - Keep net even on CRRT; if it clots off would not restart. Will reassess function at that time.  2 HTn lower vol 3 Anemia stable 4 Arrest 5 Substance abuse 6 VDRF per CCM 7 Enceph per neuro     Subjective:   No events overnight and no longer on pressors. Actually spoke his name this AM but not following commands.    Objective:   BP (!) 153/67   Pulse 97   Temp 98 F (36.7 C) (Oral)   Resp (!) 37   Ht 5\' 11"  (1.803 m)   Wt 86.6 kg (190 lb 14.7 oz)   SpO2 98%   BMI 26.63 kg/m   Intake/Output Summary (Last 24 hours) at 07/07/2018 0902 Last data filed at 07/07/2018 0800 Gross per 24 hour  Intake 881.83 ml  Output 2589 ml  Net -1707.17 ml   Weight change: -7 kg (-15 lb 6.9 oz)  Physical Exam: General appearance:moving head, not meaningful interaction. Neck:Ij cath Resp:rhonchibilaterally Cardio:S1, S2 normal and systolic murmur:systolic ejection2/6,decrescendoat 2nd left intercostal space NW:GNFAGI:soft, non-tender; bowel sounds normal; no masses, no organomegaly GU no foley Extremities: no edema    Imaging: Dg Chest Port 1 View  Result Date: 07/06/2018 CLINICAL DATA:  57 year old male with a history of endotracheal tube placement EXAM: PORTABLE CHEST 1 VIEW COMPARISON:  07/05/2018, 07/04/2018 FINDINGS:  Cardiomediastinal silhouette unchanged in size and contour. No evidence of central vascular congestion. No interlobular septal thickening. Patchy opacities of the right lung are similar to prior. No pneumothorax. No large pleural effusion. Endotracheal tube terminates 7.2 cm by the carina at the clavicular heads, unchanged from prior. Right IJ central venous catheter appears to terminate superior vena cava. Unchanged left subclavian central venous catheter appears to terminate superior vena cava. Unchanged gastric tube. IMPRESSION: Endotracheal tube is unchanged, terminating at the clavicular heads. Unchanged right IJ central catheter, left subclavian central catheter, and gastric tube. Low lung volumes with likely atelectasis/scarring. Electronically Signed   By: Gilmer MorJaime  Wagner D.O.   On: 07/06/2018 07:40    Labs: BMET Recent Labs  Lab 07/04/18 0240 07/04/18 1825 07/05/18 0350 07/05/18 1556 07/06/18 0407 07/06/18 1731 07/07/18 0351  NA 140 138 139 139 138  138 138 139  K 4.0 4.1 4.3 4.2 4.1  4.1 4.2 4.3  CL 102 101 102 101 101  101 101 101  CO2 26 24 30 26 25  25 24 25   GLUCOSE 91 80 90 81 89  89 77 80  BUN 38* 30* 29* 31* 30*  29* 30* 27*  CREATININE 4.61* 3.91* 3.58* 3.29* 3.11*  3.19* 3.32* 3.31*  CALCIUM 7.5* 7.8* 8.0* 8.2* 8.2*  8.3* 8.3* 8.3*  PHOS 4.3 3.4 3.5 3.1 2.8 2.6 2.3*   CBC Recent Labs  Lab 07/03/18 0409 07/05/18 0350  07/06/18 0407 07/07/18 0351  WBC 9.9 9.9 11.3* 15.2*  NEUTROABS 8.7* 7.0 7.7 10.0*  HGB 11.0* 11.3* 11.1* 11.1*  HCT 34.8* 36.0* 35.6* 35.9*  MCV 69.2* 69.4* 70.9* 70.4*  PLT 128* 121* 125* 140*    Medications:    . chlorhexidine  15 mL Mouth Rinse BID  . Chlorhexidine Gluconate Cloth  6 each Topical Q0600  . feeding supplement (PRO-STAT SUGAR FREE 64)  60 mL Per Tube BID  . heparin injection (subcutaneous)  5,000 Units Subcutaneous Q8H  . mouth rinse  15 mL Mouth Rinse q12n4p  . pantoprazole sodium  40 mg Per Tube Daily  . sodium  chloride flush  10-40 mL Intracatheter Q12H      Paulene Floor, MD 07/07/2018, 9:02 AM

## 2018-07-07 NOTE — Evaluation (Signed)
Speech Language Pathology Evaluation Patient Details Name: Georga KaufmannShelton M Strain MRN: [id number] DOB: 09-Oct-1961 Today's Date: 07/07/2018 Time: [id number] SLP Time Calculation (min) (ACUTE ONLY): 15 min  Problem List:  Patient Active Problem List   Diagnosis Date Noted  . AKI (acute kidney injury) (HCC)   . 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 Encounter for orogastric (OG) tube placement   . Acute respiratory failure with hypoxemia (HCC)   . Anoxic brain injury (HCC)   . Cardiac arrest (HCC) 06/30/2018   Past Medical History:  Past Medical History:  Diagnosis Date  . Depression   . Prostate enlargement    Past Surgical History: History reviewed. No pertinent surgical history. HPI:  57 y/o male had a cardiac arrest out of hospital and received 7 rounds of epinephrine to regain a pulse. Intubated from 7/4-7/10.    Assessment / Plan / Recommendation Clinical Impression  Pt demonstrates motor speech impairment and cognitive impairment following cardiac arrest with prolonged down time. Pt alert but need verbal cues to focus attention. Chorea impedes ability to participate in functional tasks. Speech isolated to one word responses, often perseverative, with moderate dysarthia exacerbated by low volume. Pt did respond correctly to basic biographical questions, but is disoriented. Will continue SLP interventions to target cognition and functional communication.     SLP Assessment  SLP Recommendation/Assessment: Patient needs continued Speech Lanaguage Pathology Services SLP Visit Diagnosis: Cognitive communication deficit (R41.841)    Follow Up Recommendations  Inpatient Rehab    Frequency and Duration min 2x/week         SLP Evaluation Cognition  Overall Cognitive Status: Impaired/Different from baseline Arousal/Alertness: Awake/alert Orientation Level: Oriented to person;Disoriented to place;Disoriented to time;Disoriented to situation Attention: Focused Focused Attention:  Appears intact Memory: Impaired Memory Impairment: Decreased short term memory Decreased Short Term Memory: Verbal basic;Functional basic Awareness: Impaired Awareness Impairment: Intellectual impairment Problem Solving: Impaired Problem Solving Impairment: Verbal basic;Functional basic       Comprehension  Auditory Comprehension Overall Auditory Comprehension: Impaired Yes/No Questions: Impaired Basic Biographical Questions: 51-75% accurate Commands: Impaired One Step Basic Commands: 50-74% accurate Conversation: Simple Interfering Components: Attention    Expression Verbal Expression Overall Verbal Expression: Impaired Initiation: No impairment Automatic Speech: Name;Social Response Level of Generative/Spontaneous Verbalization: Word Repetition: No impairment Naming: Not tested Interfering Components: Attention   Oral / Motor  Oral Motor/Sensory Function Overall Oral Motor/Sensory Function: Moderate impairment(chorea) Motor Speech Overall Motor Speech: Impaired Respiration: Within functional limits Phonation: Low vocal intensity Resonance: Within functional limits Articulation: Impaired Level of Impairment: Word Intelligibility: Intelligibility reduced Word: 50-74% accurate Phrase: Not tested Motor Planning: Witnin functional limits   GO                    Daveion Robar, Riley NearingBonnie Caroline 07/07/2018, 2:49 PM

## 2018-07-07 NOTE — Progress Notes (Signed)
Subjective: extubated yesterday, now speaking, significant choreaform movements.   Exam: Vitals:   07/07/18 0854 07/07/18 0900  BP: (!) 135/93 (!) 139/96  Pulse: 84 88  Resp: (!) 24 (!) 31  Temp:    SpO2: 99% 99%   Gen: In bed, NAD Resp: non-labored breathing, no acute distress Abd: soft, nt  Neuro: MS: Follows commands, answers simple questions such as name and identifies his sister.  ZO:XWRUECN:PERRL, corneals intact, looks in both directions, blinks to threat bilaterally.  Motor: withdrawal x 4, continuous choreiform movements.  Sensory:as above.   Impression: 57 yo M with hypoxic injury due to cardiac arrest. He has had some waxing/waning but is currently improving. He will need long term rehabilitation.   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   Recommendations: 1) If the chorea continues to be a problem, could try low dose haldol, but would favor continued observation for now.   Ritta SlotMcNeill Kambrey Hagger, MD [facility] [phone]  If 7pm- 7am, please page neurology on call as listed in AMION. 07/07/2018  9:19 AM

## 2018-07-07 NOTE — Procedures (Signed)
LTM-EEG Report  HISTORY: Continuous video-EEG monitoring performed for 57 year old with cardiac arrest, altered mental status.  ACQUISITION: International 10-20 system for electrode placement; 18 channels with additional eyes linked to ipsilateral ears and EKG. Additional T1-T2 electrodes were used. Continuous video recording obtained.   EEG NUMBER: MEDICATIONS:  Day 1: no AED  DAY #1: from 1126 07/06/18 to 0730 07/07/18  BACKGROUND: An overall medium voltage continuous recording with good spontaneous variability and reactivity. The background consisted of a medium voltage 7hz  posterior basic rhythm with superimposed theta activity bilaterally and some beta activity frontally. There was high voltage artifact from frequent episodes of head movement. State changes were captured with normal stage II sleep architecture.  EPILEPTIFORM/PERIODIC ACTIVITY: none SEIZURES: none  EVENTS: There were several events of side to side head movements without EEG correlate.   EKG: no significant arrhythmia  SUMMARY: This was a minimally abnormal continuous video EEG due to mild background slowing, suggestive of a mild diffuse cerebral disturbance. Events of head shaking had no EEG correlate. There were no epileptiform discharges or seizures.

## 2018-07-07 NOTE — Plan of Care (Signed)
  Problem: Clinical Measurements: Goal: Ability to maintain clinical measurements within normal limits will improve Outcome: Progressing Goal: Respiratory complications will improve Outcome: Progressing Note:  Patient tolerating nasal cannula without issues.   Problem: Nutrition: Goal: Adequate nutrition will be maintained Outcome: Progressing Note:  Speech eval ordered today.   Problem: Pain Managment: Goal: General experience of comfort will improve Outcome: Progressing   Problem: Skin Integrity: Goal: Risk for impaired skin integrity will decrease Outcome: Progressing   Problem: Neurologic: Goal: Promote progressive neurologic recovery Outcome: Progressing Note:  Patient able to state name and DOB this morning.   Problem: Elimination: Goal: Will not experience complications related to urinary retention Outcome: Not Progressing

## 2018-07-07 NOTE — Progress Notes (Signed)
Nutrition Follow-up  DOCUMENTATION CODES:   Not applicable  INTERVENTION:   Ensure Enlive po BID, each supplement provides 350 kcal and 20 grams of protein Magic cup TID with meals, each supplement provides 290 kcal and 9 grams of protein  If TF needed recommend:  Vital 1.5 @ 60 ml/hr (1440 ml/day) 30 ml Prostat BID  Provides: 2360 kcal, 127 grams protein, and 1100 ml free water.   NUTRITION DIAGNOSIS:   Inadequate oral intake related to lethargy/confusion as evidenced by meal completion < 25%. Ongoing   GOAL:   Patient will meet greater than or equal to 90% of their needs  Progressing   MONITOR:   Supplement acceptance, Diet advancement, PO intake, I & O's  ASSESSMENT:   Pt with unknown PMH admitted after OOH arrest.   7/6 CVVHD 7/8 vomiting, TF decreased to 20 ml/hr 7/10 extubated  Per RN plan to stop CVVHD tomorrow and monitor for need for iHD No UOP documented  Pt moving around in the bed a lot during visit. Spoke with sister and family on the phone to determine best nutrition plan for patient.  Per RN pt follows commands but is intermittently alert. Pt did not respond to questions during visit.   Medications reviewed Labs reviewed: PO4: 2.3 (L), Magnesium 3.0 (H), Cr 3.31 (H) No UOP  Diet Order:   Diet Order           DIET - DYS 1 Room service appropriate? Yes; Fluid consistency: Thin  Diet effective now          EDUCATION NEEDS:   No education needs have been identified at this time  Skin:  Skin Assessment: Reviewed RN Assessment  Last BM:  300 ml via rectal tube  Height:   Ht Readings from Last 1 Encounters:  07/05/18 5\' 11"  (1.803 m)    Weight:   Wt Readings from Last 1 Encounters:  07/07/18 190 lb 14.7 oz (86.6 kg)    Ideal Body Weight:  78.1 kg  BMI:  Body mass index is 26.63 kg/m.  Estimated Nutritional Needs:   Kcal:  2200-2400  Protein:  105-125 grams  Fluid:  2 L/day  Kendell BaneHeather Nasim Habeeb RD, LDN, CNSC [phone]  Pager [phone] After Hours Pager

## 2018-07-07 NOTE — Progress Notes (Addendum)
eLink Physician-Brief Progress Note Patient Name: Christopher KaufmannShelton M Mowbray DOB: [birth year] MRN: [id number]   Date of Service  07/07/2018  HPI/Events of Note  Hypoglycemia requiring an amp of D50W with low normal blood sugar s/p amp of D50.  eICU Interventions  D50.9 NS @ 60 ml/hr        [place]wo U Ogan 07/07/2018, 12:50 AM

## 2018-07-07 NOTE — Plan of Care (Addendum)
  Problem: Neurologic: Goal: Promote progressive neurologic recovery Outcome: Progressing Pt with purposeful movements. Follows commands intermittently  Problem: Respiratory: Goal: Ability to maintain a clear airway and adequate ventilation will improve Outcome: Progressing  Pt extubated to [place]. Maintaining respiratory status. Continue to wean O2 per [place]

## 2018-07-08 ENCOUNTER — Inpatient Hospital Stay (HOSPITAL_COMMUNITY): Payer: Medicare Other

## 2018-07-08 LAB — CBC WITH DIFFERENTIAL/PLATELET
BASOS PCT: 1 %
BASOS PCT: 1 %
Basophils Absolute: 0.1 10*3/uL (ref 0.0–0.1)
Basophils Absolute: 0.1 10*3/uL (ref 0.0–0.1)
Basophils Absolute: 0.1 10*3/uL (ref 0.0–0.1)
Basophils Relative: 1 %
EOS PCT: 3 %
EOS PCT: 2 %
EOS PCT: 2 %
Eosinophils Absolute: 0.4 10*3/uL (ref 0.0–0.7)
Eosinophils Absolute: 0.3 10*3/uL (ref 0.0–0.7)
Eosinophils Absolute: 0.3 10*3/uL (ref 0.0–0.7)
HCT: 30.8 % — ABNORMAL LOW (ref 39.0–52.0)
HEMATOCRIT: 36 % — AB (ref 39.0–52.0)
HEMATOCRIT: 35.1 % — AB (ref 39.0–52.0)
HEMOGLOBIN: 10.8 g/dL — AB (ref 13.0–17.0)
Hemoglobin: 11 g/dL — ABNORMAL LOW (ref 13.0–17.0)
Hemoglobin: 9.6 g/dL — ABNORMAL LOW (ref 13.0–17.0)
LYMPHS ABS: 1.7 10*3/uL (ref 0.7–4.0)
LYMPHS PCT: 13 %
LYMPHS PCT: 14 %
Lymphocytes Relative: 12 %
Lymphs Abs: 1.9 10*3/uL (ref 0.7–4.0)
Lymphs Abs: 1.8 10*3/uL (ref 0.7–4.0)
MCH: 21.9 pg — AB (ref 26.0–34.0)
MCH: 21.9 pg — AB (ref 26.0–34.0)
MCH: 22.2 pg — ABNORMAL LOW (ref 26.0–34.0)
MCHC: 30.6 g/dL (ref 30.0–36.0)
MCHC: 30.8 g/dL (ref 30.0–36.0)
MCHC: 31.2 g/dL (ref 30.0–36.0)
MCV: 71.6 fL — AB (ref 78.0–100.0)
MCV: 71.2 fL — AB (ref 78.0–100.0)
MCV: 71.1 fL — ABNORMAL LOW (ref 78.0–100.0)
MONOS PCT: 16 %
Monocytes Absolute: 2 10*3/uL — ABNORMAL HIGH (ref 0.1–1.0)
Monocytes Absolute: 2.6 10*3/uL — ABNORMAL HIGH (ref 0.1–1.0)
Monocytes Absolute: 2 10*3/uL — ABNORMAL HIGH (ref 0.1–1.0)
Monocytes Relative: 14 %
Monocytes Relative: 18 %
NEUTROS ABS: 9.9 10*3/uL — AB (ref 1.7–7.7)
NEUTROS ABS: 9.7 10*3/uL — AB (ref 1.7–7.7)
NEUTROS ABS: 8.3 10*3/uL — AB (ref 1.7–7.7)
NEUTROS PCT: 66 %
NEUTROS PCT: 67 %
Neutrophils Relative %: 70 %
PLATELETS: 169 10*3/uL (ref 150–400)
PLATELETS: 186 10*3/uL (ref 150–400)
Platelets: 197 10*3/uL (ref 150–400)
RBC: 5.03 MIL/uL (ref 4.22–5.81)
RBC: 4.93 MIL/uL (ref 4.22–5.81)
RBC: 4.33 MIL/uL (ref 4.22–5.81)
RDW: 16.3 % — ABNORMAL HIGH (ref 11.5–15.5)
RDW: 16.4 % — ABNORMAL HIGH (ref 11.5–15.5)
RDW: 15.7 % — AB (ref 11.5–15.5)
WBC: 14.1 10*3/uL — AB (ref 4.0–10.5)
WBC: 14.6 10*3/uL — ABNORMAL HIGH (ref 4.0–10.5)
WBC: 12.5 10*3/uL — ABNORMAL HIGH (ref 4.0–10.5)

## 2018-07-08 LAB — TRIGLYCERIDES: Triglycerides: 180 mg/dL — ABNORMAL HIGH (ref <150)

## 2018-07-08 LAB — RENAL FUNCTION PANEL
ALBUMIN: 2.5 g/dL — AB (ref 3.5–5.0)
ALBUMIN: 2.6 g/dL — AB (ref 3.5–5.0)
ANION GAP: 12 (ref 5–15)
Anion gap: 12 (ref 5–15)
BUN: 19 mg/dL (ref 6–20)
BUN: 28 mg/dL — AB (ref 6–20)
CALCIUM: 8.3 mg/dL — AB (ref 8.9–10.3)
CALCIUM: 8.4 mg/dL — AB (ref 8.9–10.3)
CO2: 25 mmol/L (ref 22–32)
CO2: 25 mmol/L (ref 22–32)
CREATININE: 2.94 mg/dL — AB (ref 0.61–1.24)
Chloride: 102 mmol/L (ref 98–111)
Chloride: 103 mmol/L (ref 98–111)
Creatinine, Ser: 2.86 mg/dL — ABNORMAL HIGH (ref 0.61–1.24)
GFR calc Af Amer: 26 mL/min — ABNORMAL LOW (ref >60)
GFR calc non Af Amer: 23 mL/min — ABNORMAL LOW (ref >60)
GFR calc non Af Amer: 22 mL/min — ABNORMAL LOW (ref >60)
GFR, EST AFRICAN AMERICAN: 26 mL/min — AB (ref >60)
GLUCOSE: 92 mg/dL (ref 70–99)
GLUCOSE: 102 mg/dL — AB (ref 70–99)
PHOSPHORUS: 2.5 mg/dL (ref 2.5–4.6)
PHOSPHORUS: 2.5 mg/dL (ref 2.5–4.6)
POTASSIUM: 4.3 mmol/L (ref 3.5–5.1)
Potassium: 4.4 mmol/L (ref 3.5–5.1)
SODIUM: 139 mmol/L (ref 135–145)
SODIUM: 140 mmol/L (ref 135–145)

## 2018-07-08 LAB — POCT ACTIVATED CLOTTING TIME
ACTIVATED CLOTTING TIME: 208 s
ACTIVATED CLOTTING TIME: 224 s
ACTIVATED CLOTTING TIME: 230 s
ACTIVATED CLOTTING TIME: 241 s
Activated Clotting Time: 230 seconds
Activated Clotting Time: 235 seconds
Activated Clotting Time: 208 seconds
Activated Clotting Time: 208 seconds
Activated Clotting Time: 191 seconds

## 2018-07-08 LAB — APTT
aPTT: 163 seconds (ref 24–36)
aPTT: 75 seconds — ABNORMAL HIGH (ref 24–36)

## 2018-07-08 LAB — GLUCOSE, CAPILLARY
GLUCOSE-CAPILLARY: 87 mg/dL (ref 70–99)
GLUCOSE-CAPILLARY: 131 mg/dL — AB (ref 70–99)
Glucose-Capillary: 90 mg/dL (ref 70–99)
Glucose-Capillary: 105 mg/dL — ABNORMAL HIGH (ref 70–99)
Glucose-Capillary: 124 mg/dL — ABNORMAL HIGH (ref 70–99)

## 2018-07-08 LAB — MAGNESIUM: Magnesium: 2.8 mg/dL — ABNORMAL HIGH (ref 1.7–2.4)

## 2018-07-08 MED ORDER — PANTOPRAZOLE SODIUM 40 MG PO TBEC
40.0000 mg | DELAYED_RELEASE_TABLET | Freq: Every day | ORAL | Status: DC
  Administered 2018-07-08 – 2018-07-25 (×14): 40 mg via ORAL
  Filled 2018-07-08 (×17): qty 1

## 2018-07-08 MED ORDER — GABAPENTIN 300 MG PO CAPS
300.0000 mg | ORAL_CAPSULE | Freq: Two times a day (BID) | ORAL | Status: DC | PRN
  Administered 2018-07-08 – 2018-07-09 (×2): 300 mg via ORAL
  Filled 2018-07-08 (×3): qty 1

## 2018-07-08 MED ORDER — GABAPENTIN 300 MG PO CAPS
300.0000 mg | ORAL_CAPSULE | Freq: Once | ORAL | Status: AC
  Administered 2018-07-08: 300 mg via ORAL
  Filled 2018-07-08: qty 1

## 2018-07-08 MED ORDER — DEXMEDETOMIDINE HCL IN NACL 400 MCG/100ML IV SOLN
0.4000 ug/kg/h | INTRAVENOUS | Status: DC
  Administered 2018-07-08: 0.4 ug/kg/h via INTRAVENOUS
  Administered 2018-07-09: 0.5 ug/kg/h via INTRAVENOUS
  Filled 2018-07-08: qty 100

## 2018-07-08 NOTE — Plan of Care (Signed)
  Problem: Clinical Measurements: Goal: Ability to maintain clinical measurements within normal limits will improve Outcome: Progressing Goal: Respiratory complications will improve Outcome: Progressing   Problem: Nutrition: Goal: Adequate nutrition will be maintained Outcome: Progressing Note:  Patient with very good appetite for breakfast.   Problem: Skin Integrity: Goal: Risk for impaired skin integrity will decrease Outcome: Progressing   Problem: Neurologic: Goal: Promote progressive neurologic recovery Outcome: Progressing

## 2018-07-08 NOTE — Progress Notes (Signed)
CRITICAL VALUE ALERT  Critical Value:  PTT 163.4   Date & Time Notied:  07/08/2018 at 0510  Provider Notified: Deterding, MD  Orders Received/Actions taken: No new orders at this time

## 2018-07-08 NOTE — Progress Notes (Signed)
Patient has some clotted blood in the condoms catheter. Bladder scan has 280cc. ?traumatic in/out cath. I will order a foley catheter to prevent any clots and outlet obstruction. We will consult urology if difficulties in insertion.

## 2018-07-08 NOTE — Progress Notes (Signed)
[facility] Progress Note    Assessment/ Plan:   57 y/o man s/o cardiac arrest with h/o cocaine use now on CRRT w/ echo showing EF 25-30% w/ diffuse HK but w/ repeat showing recovery 55-60% c/w toxic etiology.  1 AKI secondary to arrest. Not exactly sure baseline. Acid/base/K ok.He only has minimal edema in the LE and trying to keep even.  - If he clots off will hold CRRT and see how he is. [facility] depending on [facility].  - seen on CRRT @ 850 AM Pre/post/qd 800/700/2000 On heparin protocol  4K bath RIJ temp  - If no signif return then would try to avoid foley if at all possible given risk of infection and trauma.  - Keep net even on CRRT; if it clots off would not restart. Will reassess function at that time.  2 HTn lower vol 3 Anemia stable 4 Arrest 5 Substance abuse 6 VDRF per CCM 7 Enceph per neuro    Subjective:   No events overnight and no longer on pressors. Actually following simple commands today.  Blood in condom cath; nothing by in and out yesterday.  ?200ml by bladder scan?   Objective:   BP 105/71   Pulse 76   Temp 98 F (36.7 C) (Oral)   Resp (!) 21   Ht 5\' 11"  (1.803 m)   Wt 83.9 kg (184 lb 15.5 oz)   SpO2 99%   BMI 25.80 kg/m   Intake/Output Summary (Last 24 hours) at 07/08/2018 0856 Last data filed at 07/08/2018 0830 Gross per 24 hour  Intake 1393.88 ml  Output 1560 ml  Net -166.12 ml   Weight change: -2.7 kg (-5 lb 15.2 oz)  Physical Exam: General appearance:moving head, following simple commands. Neck:RIJ cath Resp:clear  Cardio:S1, S2 normal and systolic murmur:systolic ejection2/6,decrescendoat 2nd left intercostal space EA:VWUJGI:soft, non-tender; bowel sounds normal; no masses, no organomegaly GU condom cath Extremities: tr edema    Imaging: Dg Chest Port 1 View  Result Date: 07/08/2018 CLINICAL DATA:  Shortness of breath.  Respiratory failure. EXAM: PORTABLE CHEST 1 VIEW  COMPARISON:  Chest radiograph 07/07/2018. FINDINGS: Right IJ central venous catheter sheath projects over the superior vena cava. Left subclavian central venous catheter tip projects over the superior vena cava. Monitoring leads overlie the patient. Stable cardiac and mediastinal contours. Slight interval increase in bilateral interstitial pulmonary opacities. No pleural effusion or pneumothorax. IMPRESSION: Mild increased interstitial opacities may represent edema. Stable support apparatus. Electronically Signed   By: Annia Beltrew  Davis M.D.   On: 07/08/2018 08:46   Dg Chest Port 1 View  Result Date: 07/07/2018 CLINICAL DATA:  Shortness of breath.  Extubation. EXAM: PORTABLE CHEST 1 VIEW COMPARISON:  07/06/2018 and earlier. FINDINGS: Since yesterday, the endotracheal tube has been removed. RIGHT jugular central venous catheter tip projects over the UPPER SVC. LEFT subclavian central venous catheter tip projects over the mid SVC. Nasogastric tube has been removed. Cardiac silhouette mildly to moderately enlarged, unchanged. Mild pulmonary venous hypertension without overt edema. Improved aeration at the LEFT lung base with mild atelectasis persisting. Lungs otherwise clear. IMPRESSION: 1.  Support apparatus satisfactory. 2. Improved aeration at the LEFT lung base with mild atelectasis persisting. No acute cardiopulmonary disease otherwise. Electronically Signed   By: Hulan Saashomas  Lawrence M.D.   On: 07/07/2018 09:12    Labs: BMET Recent Labs  Lab 07/05/18 0350 07/05/18 1556 07/06/18 0407 07/06/18 1731 07/07/18 0351 07/07/18 1555 07/08/18 0357  NA 139 139 138  138 138 139 139 139  K 4.3 4.2 4.1  4.1 4.2 4.3 4.5 4.3  CL 102 101 101  101 101 101 103 102  CO2 30 26 25  25 24 25 25 25   GLUCOSE 90 81 89  89 77 80 79 92  BUN 29* 31* 30*  29* 30* 27* 23* 19  CREATININE 3.58* 3.29* 3.11*  3.19* 3.32* 3.31* 2.97* 2.86*  CALCIUM 8.0* 8.2* 8.2*  8.3* 8.3* 8.3* 8.2* 8.3*  PHOS 3.5 3.1 2.8 2.6 2.3* 2.6 2.5    CBC Recent Labs  Lab 07/05/18 0350 07/06/18 0407 07/07/18 0351 07/08/18 0357  WBC 9.9 11.3* 15.2* 14.1*  NEUTROABS 7.0 7.7 10.0* 9.9*  HGB 11.3* 11.1* 11.1* 11.0*  HCT 36.0* 35.6* 35.9* 36.0*  MCV 69.4* 70.9* 70.4* 71.6*  PLT 121* 125* 140* 169    Medications:    . chlorhexidine  15 mL Mouth Rinse BID  . Chlorhexidine Gluconate Cloth  6 each Topical Q0600  . feeding supplement (PRO-STAT SUGAR FREE 64)  60 mL Per Tube BID  . heparin injection (subcutaneous)  5,000 Units Subcutaneous Q8H  . mouth rinse  15 mL Mouth Rinse q12n4p  . pantoprazole  40 mg Oral Q1200  . sodium chloride flush  10-40 mL Intracatheter Q12H      Paulene Floor, MD 07/08/2018, 8:56 AM

## 2018-07-08 NOTE — Progress Notes (Addendum)
Foley catheter is still not inserted despite being discussed with bedside nurse multiple times. I am being told now that there is a policy that nurse can not insert a foley catheter if there is a hematuria.  Now patient is having massive per bight red rectal bleeding after flexiseal was pulled out. Patient is hemodynamically stable. Abdomen soft no tenderness no guarding. ?possible tear/ulceration plus being on heparin. Stools before flexiseal and post bleeding are dark no bright blood  I called GI for stat consult and will see Stat CBC and then q6hr PTT stat    Urology contacted for foley catheter insertion

## 2018-07-08 NOTE — Progress Notes (Signed)
Neurology on call (Aroor, MD) called in regards to restarting Precedex for patient. Per MD, it is okay to restart Precedex for patient due to restlessness and agitation. Deterding, MD called and notified of Neurology okay to restart Precedex.

## 2018-07-08 NOTE — Consult Note (Signed)
Reason for Consult: Renal Failure, Hematuria with Massive Prostate, Prostate Screening  Referring Physician: Aldean Jewett, MD  Christopher Dominguez is an 57 y.o. male.   HPI:   1 - Renal Failure - Cr 4'Christopher 06/2018 after MI / anoxic injury up from baseline of 1.2 2017. CT 06/2018 no hydro. Placed on CRRT with goal of eventual IHD. Making minimal urine.  2 - Hematuria with Massive Prostate - new gross hematuria during admission for MI / anoxic injury 06/2018 after attempt IO cath. Known massive prostate at 182m by CT calculation 2019. He has had prior on this admission and has been on heparin as part of CRRT.   3 - Prostate Screening - unknown FHX prostate cancer. NO prior PSA data. He as major CV and neurologic comorbidity.   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   Today "SOtilio is seen in consultation for above.    Past Medical History:  Diagnosis Date  . Depression   . Prostate enlargement     History reviewed. No pertinent surgical history.  Family History  Problem Relation Age of Onset  . Dementia Mother     Social History:  reports that he has never smoked. He has never used smokeless tobacco. He reports that he does not drink alcohol or use drugs.  Allergies: No Known Allergies  Medications: I have reviewed the patient'Christopher current medications.  Results for orders placed or performed during the hospital encounter of 06/30/18 (from the past 48 hour(Christopher))  Renal function panel (daily at 1600)     Status: Abnormal   Collection Time: 07/06/18  5:31 PM  Result Value Ref Range   Sodium 138 135 - 145 mmol/L   Potassium 4.2 3.5 - 5.1 mmol/L   Chloride 101 98 - 111 mmol/L    Comment: Please note change in reference range.   CO2 24 22 - 32 mmol/L   Glucose, Bld 77 70 - 99 mg/dL    Comment: Please note change in reference range.   BUN 30 (H) 6 - 20 mg/dL    Comment: Please  note change in reference range.   Creatinine, Ser 3.32 (H) 0.61 - 1.24 mg/dL   Calcium 8.3 (L) 8.9 - 10.3 mg/dL   Phosphorus 2.6 2.5 - 4.6 mg/dL   Albumin 2.5 (L) 3.5 - 5.0 g/dL   GFR calc non Af Amer 19 (L) >60 mL/min   GFR calc Af Amer 22 (L) >60 mL/min    Comment: (NOTE) The eGFR has been calculated using the CKD EPI equation. This calculation has not been validated in all clinical situations. eGFR'Christopher persistently <60 mL/min signify possible Chronic Kidney Disease.    Anion gap 13 5 - 15    Comment: Performed at [id number], [place] [id number] Glucose, capillary     Status: None   Collection Time: 07/06/18  7:50 PM  Result Value Ref Range   Glucose-Capillary 80 70 - 99 mg/dL   Comment 1 Capillary Specimen   POCT Activated clotting time     Status: None   Collection Time: 07/06/18  9:29 PM  Result Value Ref Range   Activated Clotting Time 208 seconds  Glucose, capillary     Status: Abnormal   Collection Time: 07/06/18 11:46 PM  Result Value Ref Range   Glucose-Capillary 66 (L) 70 - 99 mg/dL   Comment 1 Capillary Specimen  Glucose, capillary     Status: None   Collection Time: 07/07/18 12:37 AM  Result Value Ref Range   Glucose-Capillary 73 70 - 99 mg/dL   Comment 1 Capillary Specimen   POCT Activated clotting time     Status: None   Collection Time: 07/07/18  1:39 AM  Result Value Ref Range   Activated Clotting Time 213 seconds  Renal function panel (daily at 0500)     Status: Abnormal   Collection Time: 07/07/18  3:51 AM  Result Value Ref Range   Sodium 139 135 - 145 mmol/L   Potassium 4.3 3.5 - 5.1 mmol/L   Chloride 101 98 - 111 mmol/L    Comment: Please note change in reference range.   CO2 25 22 - 32 mmol/L   Glucose, Bld 80 70 - 99 mg/dL    Comment: Please note change in reference range.   BUN 27 (H) 6 - 20 mg/dL    Comment: Please note change in reference range.   Creatinine, Ser 3.31 (H) 0.61 - 1.24 mg/dL   Calcium 8.3 (L)  8.9 - 10.3 mg/dL   Phosphorus 2.3 (L) 2.5 - 4.6 mg/dL   Albumin 2.6 (L) 3.5 - 5.0 g/dL   GFR calc non Af Amer 19 (L) >60 mL/min   GFR calc Af Amer 22 (L) >60 mL/min    Comment: (NOTE) The eGFR has been calculated using the CKD EPI equation. This calculation has not been validated in all clinical situations. eGFR'Christopher persistently <60 mL/min signify possible Chronic Kidney Disease.    Anion gap 13 5 - 15    Comment: Performed at [facility] [address] [place], [place] [address]  Magnesium     Status: Abnormal   Collection Time: 07/07/18  3:51 AM  Result Value Ref Range   Magnesium 3.0 (H) 1.7 - 2.4 mg/dL    Comment: Performed at [facility] [address] [place], [place] [address]  APTT     Status: Abnormal   Collection Time: 07/07/18  3:51 AM  Result Value Ref Range   aPTT 91 (H) 24 - 36 seconds    Comment:        IF BASELINE aPTT IS ELEVATED, SUGGEST PATIENT RISK ASSESSMENT BE USED TO DETERMINE APPROPRIATE ANTICOAGULANT THERAPY. Performed at [facility], [place] [address] [place], [place] [address]   CBC with Differential/Platelet     Status: Abnormal   Collection Time: 07/07/18  3:51 AM  Result Value Ref Range   WBC 15.2 (H) 4.0 - 10.5 K/uL   RBC 5.10 4.22 - 5.81 MIL/uL   Hemoglobin 11.1 (L) 13.0 - 17.0 g/dL   HCT 35.9 (L) 39.0 - 52.0 %   MCV 70.4 (L) 78.0 - 100.0 fL   MCH 21.8 (L) 26.0 - 34.0 pg   MCHC 30.9 30.0 - 36.0 g/dL   RDW 15.9 (H) 11.5 - 15.5 %   Platelets 140 (L) 150 - 400 K/uL   Neutrophils Relative % 66 %   Lymphocytes Relative 12 %   Monocytes Relative 19 %   Eosinophils Relative 2 %   Basophils Relative 1 %   Neutro Abs 10.0 (H) 1.7 - 7.7 K/uL   Lymphs Abs 1.8 0.7 - 4.0 K/uL   Monocytes Absolute 2.9 (H) 0.1 - 1.0 K/uL   Eosinophils Absolute 0.3 0.0 - 0.7 K/uL   Basophils Absolute 0.2 (H) 0.0 - 0.1 K/uL   WBC Morphology MILD LEFT SHIFT (1-5% METAS, OCC  MYELO, OCC BANDS)     Comment: TOXIC GRANULATION Performed at [facility], [place] [address], [place], [place] [address]   Hepatic function panel     Status: Abnormal   Collection Time: 07/07/18  3:51 AM  Result Value Ref Range   Total Protein 6.2 (L) 6.5 - 8.1 g/dL   Albumin 2.6 (L) 3.5 - 5.0 g/dL   AST 40 15 - 41 U/L   ALT 333 (H) 0 - 44 U/L    Comment: Please note change in reference range.   Alkaline Phosphatase 81 38 - 126 U/L   Total Bilirubin 1.0 0.3 - 1.2 mg/dL   Bilirubin, Direct 0.1 0.0 - 0.2 mg/dL    Comment: Please note change in reference range.   Indirect Bilirubin 0.9 0.3 - 0.9 mg/dL    Comment: Performed at [facility], [place] [address] [place], [place] [address]  Glucose, capillary     Status: None   Collection Time: 07/07/18  4:03 AM  Result Value Ref Range   Glucose-Capillary 72 70 - 99 mg/dL   Comment 1 Capillary Specimen   POCT Activated clotting time     Status: None   Collection Time: 07/07/18  4:05 AM  Result Value Ref Range   Activated Clotting Time 219 seconds  Glucose, capillary     Status: None   Collection Time: 07/07/18  7:32 AM  Result Value Ref Range   Glucose-Capillary 86 70 - 99 mg/dL   Comment 1 Capillary Specimen   POCT Activated clotting time     Status: None   Collection Time: 07/07/18  8:59 AM  Result Value Ref Range   Activated Clotting Time 202 seconds  Glucose, capillary     Status: None   Collection Time: 07/07/18 11:40 AM  Result Value Ref Range   Glucose-Capillary 77 70 - 99 mg/dL   Comment 1 Capillary Specimen   POCT Activated clotting time     Status: None   Collection Time: 07/07/18  1:07 PM  Result Value Ref Range   Activated Clotting Time 208 seconds  Glucose, capillary     Status: None   Collection Time: 07/07/18  3:19 PM  Result Value Ref Range   Glucose-Capillary 81 70 - 99 mg/dL   Comment 1 Capillary Specimen    Comment 2 Notify RN   Renal function panel (daily at 1600)     Status: Abnormal   Collection Time: 07/07/18  3:55 PM  Result Value Ref Range   Sodium 139 135 - 145  mmol/L   Potassium 4.5 3.5 - 5.1 mmol/L   Chloride 103 98 - 111 mmol/L    Comment: Please note change in reference range.   CO2 25 22 - 32 mmol/L   Glucose, Bld 79 70 - 99 mg/dL    Comment: Please note change in reference range.   BUN 23 (H) 6 - 20 mg/dL    Comment: Please note change in reference range.   Creatinine, Ser 2.97 (H) 0.61 - 1.24 mg/dL   Calcium 8.2 (L) 8.9 - 10.3 mg/dL   Phosphorus 2.6 2.5 - 4.6 mg/dL   Albumin 2.6 (L) 3.5 - 5.0 g/dL   GFR calc non Af Amer 22 (L) >60 mL/min   GFR calc Af Amer 25 (L) >60 mL/min    Comment: (NOTE) The eGFR has been calculated using the CKD EPI equation. This calculation has not been validated in all clinical situations. eGFR'Christopher persistently <60 mL/min signify possible Chronic  Kidney Disease.    Anion gap 11 5 - 15    Comment: Performed at [facility] [address] [place], [place] [address]  POCT Activated clotting time     Status: None   Collection Time: 07/07/18  4:58 PM  Result Value Ref Range   Activated Clotting Time 197 seconds  Glucose, capillary     Status: None   Collection Time: 07/07/18  7:54 PM  Result Value Ref Range   Glucose-Capillary 75 70 - 99 mg/dL   Comment 1 Notify RN   POCT Activated clotting time     Status: None   Collection Time: 07/07/18  9:52 PM  Result Value Ref Range   Activated Clotting Time 224 seconds  POCT Activated clotting time     Status: None   Collection Time: 07/07/18 10:59 PM  Result Value Ref Range   Activated Clotting Time 230 seconds  Glucose, capillary     Status: None   Collection Time: 07/07/18 11:41 PM  Result Value Ref Range   Glucose-Capillary 73 70 - 99 mg/dL   Comment 1 Notify RN   POCT Activated clotting time     Status: None   Collection Time: 07/08/18 12:07 AM  Result Value Ref Range   Activated Clotting Time 241 seconds  POCT Activated clotting time     Status: None   Collection Time: 07/08/18  1:05 AM  Result Value Ref Range   Activated Clotting Time 230  seconds  POCT Activated clotting time     Status: None   Collection Time: 07/08/18  2:04 AM  Result Value Ref Range   Activated Clotting Time 230 seconds  POCT Activated clotting time     Status: None   Collection Time: 07/08/18  3:04 AM  Result Value Ref Range   Activated Clotting Time 224 seconds  Glucose, capillary     Status: None   Collection Time: 07/08/18  3:47 AM  Result Value Ref Range   Glucose-Capillary 90 70 - 99 mg/dL   Comment 1 Notify RN   Triglycerides     Status: Abnormal   Collection Time: 07/08/18  3:57 AM  Result Value Ref Range   Triglycerides 180 (H) <150 mg/dL    Comment: Performed at [facility], [place] [address] [place], [place] [address]  APTT     Status: Abnormal   Collection Time: 07/08/18  3:57 AM  Result Value Ref Range   aPTT 163 (HH) 24 - 36 seconds    Comment:        IF BASELINE aPTT IS ELEVATED, SUGGEST PATIENT RISK ASSESSMENT BE USED TO DETERMINE APPROPRIATE ANTICOAGULANT THERAPY. REPEATED TO VERIFY CRITICAL RESULT CALLED TO, READ BACK BY AND VERIFIED WITHJackquline Berlin [id number] [place] Performed at [facility], [place] [address] [place], [place] [address]   CBC with Differential/Platelet     Status: Abnormal   Collection Time: 07/08/18  3:57 AM  Result Value Ref Range   WBC 14.1 (H) 4.0 - 10.5 K/uL   RBC 5.03 4.22 - 5.81 MIL/uL   Hemoglobin 11.0 (L) 13.0 - 17.0 g/dL   HCT 36.0 (L) 39.0 - 52.0 %   MCV 71.6 (L) 78.0 - 100.0 fL   MCH 21.9 (L) 26.0 - 34.0 pg   MCHC 30.6 30.0 - 36.0 g/dL   RDW 16.3 (H) 11.5 - 15.5 %   Platelets 169 150 - 400 K/uL   Neutrophils Relative % 70 %   Lymphocytes Relative  12 %   Monocytes Relative 14 %   Eosinophils Relative 3 %   Basophils Relative 1 %   Neutro Abs 9.9 (H) 1.7 - 7.7 K/uL   Lymphs Abs 1.7 0.7 - 4.0 K/uL   Monocytes Absolute 2.0 (H) 0.1 - 1.0 K/uL   Eosinophils Absolute 0.4 0.0 - 0.7 K/uL   Basophils Absolute 0.1 0.0 - 0.1 K/uL   RBC Morphology POLYCHROMASIA PRESENT    WBC  Morphology MILD LEFT SHIFT (1-5% METAS, OCC MYELO, OCC BANDS)     Comment: ATYPICAL LYMPHOCYTES TOXIC GRANULATION Performed at [facility] [address], [place], [place] [address]   Magnesium     Status: Abnormal   Collection Time: 07/08/18  3:57 AM  Result Value Ref Range   Magnesium 2.8 (H) 1.7 - 2.4 mg/dL    Comment: Performed at [facility] [address] [place], [place] [address]  Renal function panel     Status: Abnormal   Collection Time: 07/08/18  3:57 AM  Result Value Ref Range   Sodium 139 135 - 145 mmol/L   Potassium 4.3 3.5 - 5.1 mmol/L   Chloride 102 98 - 111 mmol/L    Comment: Please note change in reference range.   CO2 25 22 - 32 mmol/L   Glucose, Bld 92 70 - 99 mg/dL    Comment: Please note change in reference range.   BUN 19 6 - 20 mg/dL    Comment: Please note change in reference range.   Creatinine, Ser 2.86 (H) 0.61 - 1.24 mg/dL   Calcium 8.3 (L) 8.9 - 10.3 mg/dL   Phosphorus 2.5 2.5 - 4.6 mg/dL   Albumin 2.5 (L) 3.5 - 5.0 g/dL   GFR calc non Af Amer 23 (L) >60 mL/min   GFR calc Af Amer 26 (L) >60 mL/min    Comment: (NOTE) The eGFR has been calculated using the CKD EPI equation. This calculation has not been validated in all clinical situations. eGFR'Christopher persistently <60 mL/min signify possible Chronic Kidney Disease.    Anion gap 12 5 - 15    Comment: Performed at [facility] [address], [place], [place] [address]  POCT Activated clotting time     Status: None   Collection Time: 07/08/18  4:00 AM  Result Value Ref Range   Activated Clotting Time 235 seconds  POCT Activated clotting time     Status: None   Collection Time: 07/08/18  5:08 AM  Result Value Ref Range   Activated Clotting Time 208 seconds  POCT Activated clotting time     Status: None   Collection Time: 07/08/18  6:07 AM  Result Value Ref Range   Activated Clotting Time 208 seconds  POCT Activated clotting time     Status: None   Collection Time: 07/08/18   7:01 AM  Result Value Ref Range   Activated Clotting Time 208 seconds  Glucose, capillary     Status: None   Collection Time: 07/08/18  7:26 AM  Result Value Ref Range   Glucose-Capillary 87 70 - 99 mg/dL   Comment 1 Capillary Specimen   POCT Activated clotting time     Status: None   Collection Time: 07/08/18 11:44 AM  Result Value Ref Range   Activated Clotting Time 191 seconds  Glucose, capillary     Status: Abnormal   Collection Time: 07/08/18 11:44 AM  Result Value Ref Range   Glucose-Capillary 131 (H) 70 - 99 mg/dL  Comment 1 Capillary Specimen   Glucose, capillary     Status: Abnormal   Collection Time: 07/08/18  3:38 PM  Result Value Ref Range   Glucose-Capillary 105 (H) 70 - 99 mg/dL   Comment 1 Capillary Specimen     Dg Chest Port 1 View  Result Date: 07/08/2018 CLINICAL DATA:  Shortness of breath.  Respiratory failure. EXAM: PORTABLE CHEST 1 VIEW COMPARISON:  Chest radiograph 07/07/2018. FINDINGS: Right IJ central venous catheter sheath projects over the superior vena cava. Left subclavian central venous catheter tip projects over the superior vena cava. Monitoring leads overlie the patient. Stable cardiac and mediastinal contours. Slight interval increase in bilateral interstitial pulmonary opacities. No pleural effusion or pneumothorax. IMPRESSION: Mild increased interstitial opacities may represent edema. Stable support apparatus. Electronically Signed   By: Lovey Newcomer M.D.   On: 07/08/2018 08:46   Dg Chest Port 1 View  Result Date: 07/07/2018 CLINICAL DATA:  Shortness of breath.  Extubation. EXAM: PORTABLE CHEST 1 VIEW COMPARISON:  07/06/2018 and earlier. FINDINGS: Since yesterday, the endotracheal tube has been removed. RIGHT jugular central venous catheter tip projects over the UPPER SVC. LEFT subclavian central venous catheter tip projects over the mid SVC. Nasogastric tube has been removed. Cardiac silhouette mildly to moderately enlarged, unchanged. Mild pulmonary  venous hypertension without overt edema. Improved aeration at the LEFT lung base with mild atelectasis persisting. Lungs otherwise clear. IMPRESSION: 1.  Support apparatus satisfactory. 2. Improved aeration at the LEFT lung base with mild atelectasis persisting. No acute cardiopulmonary disease otherwise. Electronically Signed   By: Evangeline Dakin M.D.   On: 07/07/2018 09:12    Review of Systems  Unable to perform ROS: Mental acuity   Blood pressure (!) 129/98, pulse (!) 109, temperature 99.6 F (37.6 C), temperature source Oral, resp. rate (!) 22, height _0  (1.803 m), weight 83.9 kg (184 lb 15.5 oz), SpO2 97 %. Physical Exam  Constitutional:  Ill appearing with flailing limbs and garbled speech in ICU. Sister at bedside.   HENT:  Rt CRRT catheter in place and not connected.   Cardiovascular: Normal rate.  Respiratory: Effort normal.  GI: Soft.  No scars. NO distnesion. NO SP TTP.   Genitourinary: Penis normal.  Genitourinary Comments: NO CVAT. No blood at meatus.   Neurological:  AOx0 at present.   Skin: Skin is warm.    Assessment/Plan:  1 - Renal Failure - likely due to severe ischemic injury. NO sig hydro to suggests substantial obstructive component. Agree with dialysis for with goal of IHD to minimize heparin exposure in setting of hematuria. His near anuria does not bode well.   2 - Hematuria with Massive Prostate - CT w/o upper tract lesions. This is likely from prior foley trauma in setting of massive prostate and heparin. Recommend STRONGLY AGAINST any catheters (even I/O) as this will only exacerbate problem unless frank retention (true post-void residuals of >563m). 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   NSG with good understanting and sister updated.   3 - Prostate Screening - no indication for PSA based screening at his level of comorbidity.   Very very challenging medical and social situation. Please call me directly with questions anytime.     Christopher Dominguez 07/08/2018, 4:19 PM

## 2018-07-08 NOTE — Progress Notes (Signed)
  Speech Language Pathology Treatment: Dysphagia  Patient Details Name: Christopher Dominguez MRN: [id number] DOB: 16-Sep-1961 Today's Date: 07/08/2018 Time: [id number] SLP Time Calculation (min) (ACUTE ONLY): 12 min  Assessment / Plan / Recommendation Clinical Impression  [name] chorea is significantly decreased today. RN states med he was receiving may have caused or exacerbated symptoms. Dentures not present and should hopefully be here today per sister (girlfriend to bring). Will defer diagnostic treatment for higher texture until dentures present. He needed hand over hand to grip cup and bring to mouth but appears to have some bicep activation (left hand dominant). Appears mildly impulsive and needs straw removed to prevent larger sips due to dyspnea after initial consecutive sips. No s/s aspiration exhibited. RN assisted with breakfast and reported he coughed twice. Continue Dys 1, thin, ensure upright position and small sips given decreased control of bolus size with max/total feeding assist. Will observe with upgraded texture once dentures here.    HPI HPI: 57 y/o male had a cardiac arrest out of hospital and received 7 rounds of epinephrine to regain a pulse. Intubated from 7/4-7/10.       SLP Plan  Continue with current plan of care       Recommendations  Diet recommendations: Dysphagia 1 (puree);Thin liquid Liquids provided via: Cup;Straw Medication Administration: Whole meds with puree Supervision: Patient able to self feed;Staff to assist with self feeding;Full supervision/cueing for compensatory strategies Compensations: Slow rate;Small sips/bites;Minimize environmental distractions Postural Changes and/or Swallow Maneuvers: Seated upright 90 degrees                Oral Care Recommendations: Oral care BID Follow up Recommendations: Inpatient Rehab SLP Visit Diagnosis: Dysphagia, unspecified (R13.10) Plan: Continue with current plan of care       GO                 Royce MacadamiaLitaker, Layonna Dobie Willis 07/08/2018, 10:23 AM   Breck CoonsLisa Willis Lonell FaceLitaker M.Ed [employer]-SLP Pager [phone]

## 2018-07-08 NOTE — Progress Notes (Signed)
Christopher HeinrichManny, MD at bedside. Verbal order to not place foley.

## 2018-07-08 NOTE — Progress Notes (Signed)
Notified MD Juel BurrowLin that patients has bright red blood coming out of condom cath with clots present. Per MD Juel BurrowLin turn heparin on CRRT down to 600u/hr. Per MD Juel BurrowLin DC ACT checks and turn CRRT off and return blood prior to the filter clotting. At this time CRRT filter pressures okay with no clots present. Will continue to monitor patient closely.  [address], Lurena JoinerRebecca A

## 2018-07-08 NOTE — Progress Notes (Signed)
Subjective: Movements improved though still present  Exam: Vitals:   07/08/18 0930 07/08/18 0936  BP:  (!) 139/99  Pulse: 91 93  Resp: (!) 22 (!) 24  Temp:    SpO2: 96% 96%   Gen: In bed, NAD Resp: non-labored breathing, no acute distress Abd: soft, nt  Neuro: MS: Follows commands, answers simple questions, no t oriented CX:KGYJECN:PERRL, corneals intact, looks in both directions, blinks to threat bilaterally.  Motor: withdrawal x 4, continuous choreiform movements.  Sensory:as above.   Impression: 57 yo M with hypoxic injury due to cardiac arrest. He has had some waxing/waning but is currently improving. He will need long term rehabilitation.   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 He endorses RLS symptoms at baseline and will trty a dose of gabapentin to see it it helps.   Recommendations: 1) gabapentin 300mg  once, if movements improve, could consider continuing this.  2) haldol could be useful, but long QT  Ritta SlotMcNeill Kataleya Zaugg, MD [facility] [phone]  If 7pm- 7am, please page neurology on call as listed in AMION. 07/08/2018  9:52 AM

## 2018-07-08 NOTE — Progress Notes (Signed)
While in patients room patient had large bloody BM with large clots present. MD Juel BurrowLin and MD Aljishi notified. Per MD Juel BurrowLin stop CRRT. Blood returned from CRRT, VSS. Stat CBC, and APTT ordered. Labs sent STAT. Foley not inserted due to patient care needed in another room. Urology and GI consulted. Will continue to monitor.  [address]ock, Lurena JoinerRebecca A

## 2018-07-08 NOTE — Progress Notes (Signed)
PULMONARY / CRITICAL CARE MEDICINE   Name: Christopher KaufmannShelton M Dominguez MRN: [id number] DOB: 02/27/1961    ADMISSION DATE:  06/30/2018  CHIEF COMPLAINT:  Cardiac arrest  HISTORY OF PRESENT ILLNESS:   57 y/o male had a cardiac arrest out of hospital and received 7 rounds of epinephrine to regain a pulse.     SUBJECTIVE:  Very sleepy but answers questions by nodding and following commands.    VITAL SIGNS: BP 120/75   Pulse 98   Temp 98 F (36.7 C) (Oral)   Resp (!) 37   Ht 5\' 11"  (1.803 m)   Wt 83.9 kg (184 lb 15.5 oz)   SpO2 95%   BMI 25.80 kg/m   HEMODYNAMICS: CVP:  [2 mmHg-3 mmHg] 2 mmHg  VENTILATOR SETTINGS:    INTAKE / OUTPUT: I/O last 3 completed shifts: In: 1886.7 [I.V.:1706.7; Other:180] Out: 3020 [Other:2795; Stool:225]  PHYSICAL EXAMINATION:  General: 57 year old male patient currently on nasal canula sleeping  HEENT normocephalic atraumatic orally intubated Pulmonary: Clear to auscultation mechanically assisted breaths Cardiac: Regular rate and rhythm Abdomen: Soft nontender Extremities: Brisk cap refill, generalized edema.  Warm, strong pulses. Neuro: Awakens to voice.  Follows commands intermittently, no focal motor deficits appreciated but has repetitive choreatic movements   LABS:  BMET Recent Labs  Lab 07/07/18 0351 07/07/18 1555 07/08/18 0357  NA 139 139 139  K 4.3 4.5 4.3  CL 101 103 102  CO2 25 25 25   BUN 27* 23* 19  CREATININE 3.31* 2.97* 2.86*  GLUCOSE 80 79 92    Electrolytes Recent Labs  Lab 07/06/18 0407  07/07/18 0351 07/07/18 1555 07/08/18 0357  CALCIUM 8.2*  8.3*   < > 8.3* 8.2* 8.3*  MG 2.9*  --  3.0*  --  2.8*  PHOS 2.8   < > 2.3* 2.6 2.5   < > = values in this interval not displayed.    CBC Recent Labs  Lab 07/06/18 0407 07/07/18 0351 07/08/18 0357  WBC 11.3* 15.2* 14.1*  HGB 11.1* 11.1* 11.0*  HCT 35.6* 35.9* 36.0*  PLT 125* 140* 169    Coag's Recent Labs  Lab 07/06/18 0407 07/07/18 0351 07/08/18 0357   APTT 60* 91* 163*    Sepsis Markers Recent Labs  Lab 07/02/18 0437 07/02/18 0954  LATICACIDVEN  --  1.7  PROCALCITON 60.76  --     ABG Recent Labs  Lab 07/02/18 0445 07/06/18 1055  PHART 7.272* 7.423  PCO2ART 37.7 36.7  PO2ART 135.0* 155.0*    Liver Enzymes Recent Labs  Lab 07/04/18 0240  07/07/18 0351 07/07/18 1555 07/08/18 0357  AST 247*  --  40  --   --   ALT 1,173*  --  333*  --   --   ALKPHOS 65  --  81  --   --   BILITOT 0.8  --  1.0  --   --   ALBUMIN 2.2*  2.2*   < > 2.6*  2.6* 2.6* 2.5*   < > = values in this interval not displayed.    Cardiac Enzymes No results for input(s): TROPONINI, PROBNP in the last 168 hours.  Glucose Recent Labs  Lab 07/07/18 1140 07/07/18 1519 07/07/18 1954 07/07/18 2341 07/08/18 0347 07/08/18 0726  GLUCAP 77 81 75 73 90 87    Imaging Dg Chest Port 1 View  Result Date: 07/08/2018 CLINICAL DATA:  Shortness of breath.  Respiratory failure. EXAM: PORTABLE CHEST 1 VIEW COMPARISON:  Chest radiograph 07/07/2018. FINDINGS: Right IJ  central venous catheter sheath projects over the superior vena cava. Left subclavian central venous catheter tip projects over the superior vena cava. Monitoring leads overlie the patient. Stable cardiac and mediastinal contours. Slight interval increase in bilateral interstitial pulmonary opacities. No pleural effusion or pneumothorax. IMPRESSION: Mild increased interstitial opacities may represent edema. Stable support apparatus. Electronically Signed   By: Annia Belt M.D.   On: 07/08/2018 08:46     STUDIES:  7/4 CT of the head worrisome for anoxic injury EEG 7/5 > diffuse cerebral disfunction that is non-specific TTE 7/5 LVEF 25-30%, mild LVD, enlarged RV with severe RV dysfuncion CT head 7/5 > no acute intracranial abnormality, prior CT findings may have been artifact  ANTIBIOTICS: Zosyn 7/4>>> 7/10  SIGNIFICANT EVENTS:  7/4>>> cardiac arrest 7/6 >>> spontaneous movements, pain  response, CVVHD 7/7 >> early AM myoclonus  LINES/TUBES: ETT 7/4>>> 7/10 L IJ TLC 7/4>>> R radial a-line 7/4>>> R IJ HD cath >>   Cultures: Blood 7/4>>> C. Diff 7/4 >> neg  DISCUSSION: 57 y/o male with minimal past medical history here after cardiac arrest.  ASSESSMENT / PLAN: Resolved Issues Shock Vomiting (7/8) Cardiogenic shock   Active issues Acute respiratory failure with hypoxemia  Now off mechanical ventilation  Aspiration pneumonia  - plan Extubated 7/10 and doing well on [place]  Finished ABx course    s/p Cardiac arrest w/ presumed toxic Cardiomyopathy d/t cocaine  HTN -EF has improved from 25-30% to 55-60% w/out current evidence of WM abnormality Trop elevation felt to be d/t CPR and not ACS Plan Cont tele monitoring Drug abuse cessation  Eventually ischemia eval before going home PRN hydralazine   acute  renal failure -tolerating CRRT. Started on 7/7 -cr improved  Plan  Per nephrology ? D/c CRRT and do intermittent when needed  Likely aim for even Repeat daily chemistries  Strict I&O - insert foley cath ? Urine in bladder ? Hematuria    At risk for fluid and electrolyte imbalance  Plan Check and replace as indicated  Shock liver acute transaminitis -->trending down  Plan Improved   suspected anoxic brain injury Now with chorea movements Serial exams showing slow improvement  We appreciate neuro follow up Neuro is starting Gabapentin  ? Benefit from haldol if QT interval allows   Plan Appreciate neuro input  FAMILY  - Updates: no family at bedside   - Inter-disciplinary family meet or Palliative Care meeting due by:  day 7   DVT prophylaxis: [place] heparin  SUPPPI Diet: tubefeed Activity: BR Disposition : ICU  Mateo Flow MD [facility] Pulmonary/Critical Care Pager  [phone]    07/08/2018, 10:33 AM

## 2018-07-08 NOTE — Progress Notes (Signed)
-   Received consult for rectal bleeding after removing rectal tube.  Most likely hemorrhoidal bleed vs bleeding from stercoral ulcer vs irritation from rectal tube. Hgb 11 this morning  -  Monitor H and H for now  -  Supportive care for now  - Call us back if ongoing bleeding or significant drop in Hgb . Otherwise [facility] will see patient tomorrow.   Kathi DerParag Margareth Kanner MD, FACP 07/08/2018, 3:41 PM  Contact #  [phone]

## 2018-07-08 NOTE — Progress Notes (Signed)
Received call from Dr. Berneice HeinrichManny from urology, per MD do not insert foley on patient. Will continue to monitor.  [address], Lurena JoinerRebecca A

## 2018-07-08 NOTE — Progress Notes (Signed)
During bath, patient voided bright red blood. This RN placed a condom cath. ELink cameraed in. No new orders at this time.

## 2018-07-08 NOTE — Progress Notes (Signed)
Deterding, MD called and notified regarding patient very restless in bed despite efforts to calm. Precedex d/c in AM. PRN Gabapentin given by this RN. Also communicated to Deterding, MD that nephrology did not want a foley catheter placed in this patient at this time. Awaiting orders.

## 2018-07-09 ENCOUNTER — Inpatient Hospital Stay (HOSPITAL_COMMUNITY): Payer: Medicare Other

## 2018-07-09 ENCOUNTER — Encounter (HOSPITAL_COMMUNITY): Payer: Self-pay | Admitting: Gastroenterology

## 2018-07-09 DIAGNOSIS — K922 Gastrointestinal hemorrhage, unspecified: Secondary | ICD-10-CM

## 2018-07-09 DIAGNOSIS — R310 Gross hematuria: Secondary | ICD-10-CM

## 2018-07-09 LAB — RENAL FUNCTION PANEL
ANION GAP: 10 (ref 5–15)
Albumin: 2.3 g/dL — ABNORMAL LOW (ref 3.5–5.0)
BUN: 44 mg/dL — ABNORMAL HIGH (ref 6–20)
CO2: 24 mmol/L (ref 22–32)
Calcium: 8.1 mg/dL — ABNORMAL LOW (ref 8.9–10.3)
Chloride: 105 mmol/L (ref 98–111)
Creatinine, Ser: 5.67 mg/dL — ABNORMAL HIGH (ref 0.61–1.24)
GFR calc non Af Amer: 10 mL/min — ABNORMAL LOW (ref >60)
GFR, EST AFRICAN AMERICAN: 12 mL/min — AB (ref >60)
Glucose, Bld: 123 mg/dL — ABNORMAL HIGH (ref 70–99)
POTASSIUM: 4.2 mmol/L (ref 3.5–5.1)
Phosphorus: 5.8 mg/dL — ABNORMAL HIGH (ref 2.5–4.6)
Sodium: 139 mmol/L (ref 135–145)

## 2018-07-09 LAB — CBC WITH DIFFERENTIAL/PLATELET
ABS IMMATURE GRANULOCYTES: 0.5 10*3/uL — AB (ref 0.0–0.1)
Basophils Absolute: 0 10*3/uL (ref 0.0–0.1)
Basophils Relative: 0 %
EOS PCT: 2 %
Eosinophils Absolute: 0.3 10*3/uL (ref 0.0–0.7)
HEMATOCRIT: 30.2 % — AB (ref 39.0–52.0)
Hemoglobin: 9.2 g/dL — ABNORMAL LOW (ref 13.0–17.0)
Immature Granulocytes: 5 %
LYMPHS ABS: 1.8 10*3/uL (ref 0.7–4.0)
LYMPHS PCT: 16 %
MCH: 21.8 pg — AB (ref 26.0–34.0)
MCHC: 30.5 g/dL (ref 30.0–36.0)
MCV: 71.6 fL — AB (ref 78.0–100.0)
MONO ABS: 1.8 10*3/uL — AB (ref 0.1–1.0)
MONOS PCT: 16 %
Neutro Abs: 7.1 10*3/uL (ref 1.7–7.7)
Neutrophils Relative %: 61 %
Platelets: 192 10*3/uL (ref 150–400)
RBC: 4.22 MIL/uL (ref 4.22–5.81)
RDW: 15.8 % — ABNORMAL HIGH (ref 11.5–15.5)
WBC: 11.5 10*3/uL — AB (ref 4.0–10.5)

## 2018-07-09 LAB — APTT: aPTT: 36 seconds (ref 24–36)

## 2018-07-09 LAB — GLUCOSE, CAPILLARY
GLUCOSE-CAPILLARY: 114 mg/dL — AB (ref 70–99)
GLUCOSE-CAPILLARY: 96 mg/dL (ref 70–99)
Glucose-Capillary: 110 mg/dL — ABNORMAL HIGH (ref 70–99)
Glucose-Capillary: 112 mg/dL — ABNORMAL HIGH (ref 70–99)
Glucose-Capillary: 119 mg/dL — ABNORMAL HIGH (ref 70–99)
Glucose-Capillary: 131 mg/dL — ABNORMAL HIGH (ref 70–99)

## 2018-07-09 LAB — MAGNESIUM: MAGNESIUM: 3.1 mg/dL — AB (ref 1.7–2.4)

## 2018-07-09 MED ORDER — HALOPERIDOL LACTATE 5 MG/ML IJ SOLN
2.5000 mg | Freq: Four times a day (QID) | INTRAMUSCULAR | Status: DC | PRN
  Administered 2018-07-09: 2.5 mg via INTRAVENOUS
  Filled 2018-07-09: qty 1

## 2018-07-09 MED ORDER — NEPRO/CARBSTEADY PO LIQD
237.0000 mL | Freq: Two times a day (BID) | ORAL | Status: DC
  Administered 2018-07-10 – 2018-07-26 (×26): 237 mL via ORAL
  Filled 2018-07-09 (×38): qty 237

## 2018-07-09 MED ORDER — LIP MEDEX EX OINT
TOPICAL_OINTMENT | CUTANEOUS | Status: DC | PRN
  Administered 2018-07-10: 1 via TOPICAL
  Filled 2018-07-09: qty 7

## 2018-07-09 MED ORDER — HALOPERIDOL LACTATE 5 MG/ML IJ SOLN
5.0000 mg | Freq: Four times a day (QID) | INTRAMUSCULAR | Status: DC | PRN
  Administered 2018-07-09: 5 mg via INTRAVENOUS
  Filled 2018-07-09: qty 1

## 2018-07-09 MED ORDER — ENSURE ENLIVE PO LIQD
237.0000 mL | Freq: Three times a day (TID) | ORAL | Status: DC
Start: 2018-07-09 — End: 2018-07-09

## 2018-07-09 NOTE — Progress Notes (Signed)
Rehab Admissions Coordinator Note:  Patient was screened by Clois DupesBoyette, Shelby Anderle Godwin for appropriateness for an Inpatient Acute Rehab Consult per PT recommendation. I would like to follow pt's progress for his ability to participate with therapies. I will follow.  Clois DupesBoyette, Baillie Mohammad Godwin 07/09/2018, 12:50 PM  I can be reached at [phone].

## 2018-07-09 NOTE — Progress Notes (Signed)
PULMONARY / CRITICAL CARE MEDICINE   Name: Christopher Dominguez MRN: [id number] DOB: 11-Jul-1961    ADMISSION DATE:  06/30/2018  CHIEF COMPLAINT:  Cardiac arrest  HISTORY OF PRESENT ILLNESS:   57 y/o male had a cardiac arrest out of hospital and received 7 rounds of epinephrine to regain a pulse.     SUBJECTIVE:  Required precedex overnight due to constant chorea and moving in bed and agitation. Patient is now off precedex again. He is starting to become restless. Had another episode of rectal bleeding overnight.  Awake alert today following commands. CRRT was stopped yesterday due to hematuria and rectal bleeding    VITAL SIGNS: BP 104/83 (BP Location: Right Arm)   Pulse 74   Temp 98.6 F (37 C) (Oral)   Resp 16   Ht 5\' 11"  (1.803 m)   Wt 84 kg (185 lb 3 oz)   SpO2 96%   BMI 25.83 kg/m   HEMODYNAMICS:    VENTILATOR SETTINGS:    INTAKE / OUTPUT: I/O last 3 completed shifts: In: 2443 [P.O.:420; I.V.:2023] Out: 1703 [Urine:130; WUJWJ:1914; Stool:25]  PHYSICAL EXAMINATION:  General: 57 year old male patient currently on nasal canula sleeping  HEENT normocephalic atraumatic orally intubated Pulmonary: Clear to auscultation mechanically assisted breaths Cardiac: Regular rate and rhythm Abdomen: Soft nontender Extremities: Brisk cap refill, generalized edema.  Warm, strong pulses. Neuro: Awakens alert.  Follows commands intermittently, no focal motor deficits appreciated but has repetitive choreatic movements   LABS:  BMET Recent Labs  Lab 07/08/18 0357 07/08/18 1541 07/09/18 0342  NA 139 140 139  K 4.3 4.4 4.2  CL 102 103 105  CO2 25 25 24   BUN 19 28* 44*  CREATININE 2.86* 2.94* 5.67*  GLUCOSE 92 102* 123*    Electrolytes Recent Labs  Lab 07/07/18 0351  07/08/18 0357 07/08/18 1541 07/09/18 0342  CALCIUM 8.3*   < > 8.3* 8.4* 8.1*  MG 3.0*  --  2.8*  --  3.1*  PHOS 2.3*   < > 2.5 2.5 5.8*   < > = values in this interval not displayed.     CBC Recent Labs  Lab 07/08/18 1541 07/08/18 2146 07/09/18 0342  WBC 14.6* 12.5* 11.5*  HGB 10.8* 9.6* 9.2*  HCT 35.1* 30.8* 30.2*  PLT 197 186 192    Coag's Recent Labs  Lab 07/08/18 0357 07/08/18 1541 07/09/18 0342  APTT 163* 75* 36    Sepsis Markers Recent Labs  Lab 07/02/18 0954  LATICACIDVEN 1.7    ABG Recent Labs  Lab 07/06/18 1055  PHART 7.423  PCO2ART 36.7  PO2ART 155.0*    Liver Enzymes Recent Labs  Lab 07/04/18 0240  07/07/18 0351  07/08/18 0357 07/08/18 1541 07/09/18 0342  AST 247*  --  40  --   --   --   --   ALT 1,173*  --  333*  --   --   --   --   ALKPHOS 65  --  81  --   --   --   --   BILITOT 0.8  --  1.0  --   --   --   --   ALBUMIN 2.2*  2.2*   < > 2.6*  2.6*   < > 2.5* 2.6* 2.3*   < > = values in this interval not displayed.    Cardiac Enzymes No results for input(s): TROPONINI, PROBNP in the last 168 hours.  Glucose Recent Labs  Lab 07/08/18 1144 07/08/18 1538 07/08/18  2023 07/08/18 2347 07/09/18 0343 07/09/18 0740  GLUCAP 131* 105* 124* 110* 114* 112*    Imaging No results found.   STUDIES:  7/4 CT of the head worrisome for anoxic injury EEG 7/5 > diffuse cerebral disfunction that is non-specific TTE 7/5 LVEF 25-30%, mild LVD, enlarged RV with severe RV dysfuncion CT head 7/5 > no acute intracranial abnormality, prior CT findings may have been artifact  ANTIBIOTICS: Zosyn 7/4>>> 7/10  SIGNIFICANT EVENTS:  7/4>>> cardiac arrest 7/6 >>> spontaneous movements, pain response, CVVHD 7/7 >> early AM myoclonus  LINES/TUBES: ETT 7/4>>> 7/10 L IJ TLC 7/4>>> R radial a-line 7/4>>> R IJ HD cath >>   Cultures: Blood 7/4>>> C. Diff 7/4 >> neg  DISCUSSION: 57 y/o male with minimal past medical history here after cardiac arrest.  ASSESSMENT / PLAN: Resolved Issues Shock Vomiting (7/8) Cardiogenic shock   Active issues Acute respiratory failure with hypoxemia  Now off mechanical ventilation   Aspiration pneumonia  - plan Extubated 7/10 and doing well on [place]  Finished ABx course    s/p Cardiac arrest w/ presumed toxic Cardiomyopathy d/t cocaine  HTN -EF has improved from 25-30% to 55-60% w/out current evidence of WM abnormality Trop elevation felt to be d/t CPR and not ACS Plan Cont tele monitoring Drug abuse cessation  Eventually ischemia eval before going home PRN hydralazine   Hematuria - US bladder today - heparin [place] on hold - we appreciate urology input   Rectal Bleeding  - GI consulted yesterday we appreciate their follow up - heparin sq held  - Hb stable   acute  renal failure -stopped CRRT on 7/12 due to hematuria and rectal bleeding. Was Started on 7/7   Plan - we appreciate nephro follow up - possible regular HD tomorrow - US urinary bladder today  - Repeat daily chemistries  - Strict I&O - Avoid foley catheter insertion as per urology since very large prostate unless residual >500     Shock liver acute transaminitis -->trending down  Plan Improved   suspected anoxic brain injury Now with chorea movements Serial exams showing slow improvement  We appreciate neuro follow up   Plan Appreciate neuro input  Neuro is started Gabapentin  I will gie one dose haldol and reassess  Stopped precedex again today     FAMILY  - Updates: no family at bedside   - Inter-disciplinary family meet or Palliative Care meeting due by:  day 7    Mateo FlowWael Aljishi MD [facility] Pulmonary/Critical Care Pager  [phone]    07/09/2018, 9:00 AM

## 2018-07-09 NOTE — Progress Notes (Addendum)
Nutrition Brief Note  RD consulted because "family is concerned he is not getting enough food." Patient presented with OOH arrest, was recently extubated, off CVVHD, started iHD  Ordered NS BID due to elevated PO4 and Mag. Ordered double protein portions in healthtouch. D/C'd pro-stat  Dionne AnoWilliam M. Cabot [place], MS, RD LDN Inpatient Clinical Dietitian Pager [phone]

## 2018-07-09 NOTE — Consult Note (Signed)
Reason for Consult:Bright red blood per rectum Referring Physician: ICU team  Christopher Dominguez is an 57 y.o. male.  HPI: patient seen and examined and case discussed with his nurse as well as his sister and his hospital computer chart was reviewed and unfortunately no prior GI history is obtainable and the sister is not aware of any previous problem and he had a negative CT on admission and only had bright red blood after the rectal tube was removed while he was on heparin and has not had any further bleeding today and his hematuria seem to be much more significant according to the nurse and when awake he does not have any abdominal complaints the family history is negative for any GI problems except for possibly the-with some sort of abdominal issue  Past Medical History:  Diagnosis Date  . Depression   . Prostate enlargement     History reviewed. No pertinent surgical history.  Family History  Problem Relation Age of Onset  . Dementia Mother     Social History:  reports that he has never smoked. He has never used smokeless tobacco. He reports that he does not drink alcohol or use drugs.  Allergies: No Known Allergies  Medications: I have reviewed the patient's current medications.  Results for orders placed or performed during the hospital encounter of 06/30/18 (from the past 48 hour(s))  POCT Activated clotting time     Status: None   Collection Time: 07/07/18  1:07 PM  Result Value Ref Range   Activated Clotting Time 208 seconds  Glucose, capillary     Status: None   Collection Time: 07/07/18  3:19 PM  Result Value Ref Range   Glucose-Capillary 81 70 - 99 mg/dL   Comment 1 Capillary Specimen    Comment 2 Notify RN   Renal function panel (daily at 1600)     Status: Abnormal   Collection Time: 07/07/18  3:55 PM  Result Value Ref Range   Sodium 139 135 - 145 mmol/L   Potassium 4.5 3.5 - 5.1 mmol/L   Chloride 103 98 - 111 mmol/L    Comment: Please note change in reference  range.   CO2 25 22 - 32 mmol/L   Glucose, Bld 79 70 - 99 mg/dL    Comment: Please note change in reference range.   BUN 23 (H) 6 - 20 mg/dL    Comment: Please note change in reference range.   Creatinine, Ser 2.97 (H) 0.61 - 1.24 mg/dL   Calcium 8.2 (L) 8.9 - 10.3 mg/dL   Phosphorus 2.6 2.5 - 4.6 mg/dL   Albumin 2.6 (L) 3.5 - 5.0 g/dL   GFR calc non Af Amer 22 (L) >60 mL/min   GFR calc Af Amer 25 (L) >60 mL/min    Comment: (NOTE) The eGFR has been calculated using the CKD EPI equation. This calculation has not been validated in all clinical situations. eGFR's persistently <60 mL/min signify possible Chronic Kidney Disease.    Anion gap 11 5 - 15    Comment: Performed at [facility] [address] [place], [place] [address]  POCT Activated clotting time     Status: None   Collection Time: 07/07/18  4:58 PM  Result Value Ref Range   Activated Clotting Time 197 seconds  Glucose, capillary     Status: None   Collection Time: 07/07/18  7:54 PM  Result Value Ref Range   Glucose-Capillary 75 70 - 99 mg/dL   Comment 1  Notify RN   POCT Activated clotting time     Status: None   Collection Time: 07/07/18  9:52 PM  Result Value Ref Range   Activated Clotting Time 224 seconds  POCT Activated clotting time     Status: None   Collection Time: 07/07/18 10:59 PM  Result Value Ref Range   Activated Clotting Time 230 seconds  Glucose, capillary     Status: None   Collection Time: 07/07/18 11:41 PM  Result Value Ref Range   Glucose-Capillary 73 70 - 99 mg/dL   Comment 1 Notify RN   POCT Activated clotting time     Status: None   Collection Time: 07/08/18 12:07 AM  Result Value Ref Range   Activated Clotting Time 241 seconds  POCT Activated clotting time     Status: None   Collection Time: 07/08/18  1:05 AM  Result Value Ref Range   Activated Clotting Time 230 seconds  POCT Activated clotting time     Status: None   Collection Time: 07/08/18  2:04 AM  Result Value Ref Range    Activated Clotting Time 230 seconds  POCT Activated clotting time     Status: None   Collection Time: 07/08/18  3:04 AM  Result Value Ref Range   Activated Clotting Time 224 seconds  Glucose, capillary     Status: None   Collection Time: 07/08/18  3:47 AM  Result Value Ref Range   Glucose-Capillary 90 70 - 99 mg/dL   Comment 1 Notify RN   Triglycerides     Status: Abnormal   Collection Time: 07/08/18  3:57 AM  Result Value Ref Range   Triglycerides 180 (H) <150 mg/dL    Comment: Performed at [facility], [place] [address], [place], [place] [address]  APTT     Status: Abnormal   Collection Time: 07/08/18  3:57 AM  Result Value Ref Range   aPTT 163 (HH) 24 - 36 seconds    Comment:        IF BASELINE aPTT IS ELEVATED, SUGGEST PATIENT RISK ASSESSMENT BE USED TO DETERMINE APPROPRIATE ANTICOAGULANT THERAPY. REPEATED TO VERIFY CRITICAL RESULT CALLED TO, READ BACK BY AND VERIFIED WITHJackquline Berlin [id number] [place] Performed at [facility], [place] [address], [place], [place] [address]   CBC with Differential/Platelet     Status: Abnormal   Collection Time: 07/08/18  3:57 AM  Result Value Ref Range   WBC 14.1 (H) 4.0 - 10.5 K/uL   RBC 5.03 4.22 - 5.81 MIL/uL   Hemoglobin 11.0 (L) 13.0 - 17.0 g/dL   HCT 36.0 (L) 39.0 - 52.0 %   MCV 71.6 (L) 78.0 - 100.0 fL   MCH 21.9 (L) 26.0 - 34.0 pg   MCHC 30.6 30.0 - 36.0 g/dL   RDW 16.3 (H) 11.5 - 15.5 %   Platelets 169 150 - 400 K/uL   Neutrophils Relative % 70 %   Lymphocytes Relative 12 %   Monocytes Relative 14 %   Eosinophils Relative 3 %   Basophils Relative 1 %   Neutro Abs 9.9 (H) 1.7 - 7.7 K/uL   Lymphs Abs 1.7 0.7 - 4.0 K/uL   Monocytes Absolute 2.0 (H) 0.1 - 1.0 K/uL   Eosinophils Absolute 0.4 0.0 - 0.7 K/uL   Basophils Absolute 0.1 0.0 - 0.1 K/uL   RBC Morphology POLYCHROMASIA PRESENT    WBC Morphology MILD LEFT SHIFT (1-5% METAS, OCC MYELO, OCC BANDS)     Comment: ATYPICAL  LYMPHOCYTES TOXIC  GRANULATION Performed at [facility], [place] [address], [place], [place] [address]   Magnesium     Status: Abnormal   Collection Time: 07/08/18  3:57 AM  Result Value Ref Range   Magnesium 2.8 (H) 1.7 - 2.4 mg/dL    Comment: Performed at [facility] [address], [place], [place] [address]  Renal function panel     Status: Abnormal   Collection Time: 07/08/18  3:57 AM  Result Value Ref Range   Sodium 139 135 - 145 mmol/L   Potassium 4.3 3.5 - 5.1 mmol/L   Chloride 102 98 - 111 mmol/L    Comment: Please note change in reference range.   CO2 25 22 - 32 mmol/L   Glucose, Bld 92 70 - 99 mg/dL    Comment: Please note change in reference range.   BUN 19 6 - 20 mg/dL    Comment: Please note change in reference range.   Creatinine, Ser 2.86 (H) 0.61 - 1.24 mg/dL   Calcium 8.3 (L) 8.9 - 10.3 mg/dL   Phosphorus 2.5 2.5 - 4.6 mg/dL   Albumin 2.5 (L) 3.5 - 5.0 g/dL   GFR calc non Af Amer 23 (L) >60 mL/min   GFR calc Af Amer 26 (L) >60 mL/min    Comment: (NOTE) The eGFR has been calculated using the CKD EPI equation. This calculation has not been validated in all clinical situations. eGFR's persistently <60 mL/min signify possible Chronic Kidney Disease.    Anion gap 12 5 - 15    Comment: Performed at [facility] [address], [place], [place] [address]  POCT Activated clotting time     Status: None   Collection Time: 07/08/18  4:00 AM  Result Value Ref Range   Activated Clotting Time 235 seconds  POCT Activated clotting time     Status: None   Collection Time: 07/08/18  5:08 AM  Result Value Ref Range   Activated Clotting Time 208 seconds  POCT Activated clotting time     Status: None   Collection Time: 07/08/18  6:07 AM  Result Value Ref Range   Activated Clotting Time 208 seconds  POCT Activated clotting time     Status: None   Collection Time: 07/08/18  7:01 AM  Result Value Ref Range   Activated Clotting Time 208 seconds  Glucose, capillary      Status: None   Collection Time: 07/08/18  7:26 AM  Result Value Ref Range   Glucose-Capillary 87 70 - 99 mg/dL   Comment 1 Capillary Specimen   POCT Activated clotting time     Status: None   Collection Time: 07/08/18 11:44 AM  Result Value Ref Range   Activated Clotting Time 191 seconds  Glucose, capillary     Status: Abnormal   Collection Time: 07/08/18 11:44 AM  Result Value Ref Range   Glucose-Capillary 131 (H) 70 - 99 mg/dL   Comment 1 Capillary Specimen   Glucose, capillary     Status: Abnormal   Collection Time: 07/08/18  3:38 PM  Result Value Ref Range   Glucose-Capillary 105 (H) 70 - 99 mg/dL   Comment 1 Capillary Specimen   Renal function panel (daily at 1600)     Status: Abnormal   Collection Time: 07/08/18  3:41 PM  Result Value Ref Range   Sodium 140 135 - 145 mmol/L   Potassium 4.4 3.5 - 5.1 mmol/L   Chloride 103 98 - 111  mmol/L    Comment: Please note change in reference range.   CO2 25 22 - 32 mmol/L   Glucose, Bld 102 (H) 70 - 99 mg/dL    Comment: Please note change in reference range.   BUN 28 (H) 6 - 20 mg/dL    Comment: Please note change in reference range.   Creatinine, Ser 2.94 (H) 0.61 - 1.24 mg/dL   Calcium 8.4 (L) 8.9 - 10.3 mg/dL   Phosphorus 2.5 2.5 - 4.6 mg/dL   Albumin 2.6 (L) 3.5 - 5.0 g/dL   GFR calc non Af Amer 22 (L) >60 mL/min   GFR calc Af Amer 26 (L) >60 mL/min    Comment: (NOTE) The eGFR has been calculated using the CKD EPI equation. This calculation has not been validated in all clinical situations. eGFR's persistently <60 mL/min signify possible Chronic Kidney Disease.    Anion gap 12 5 - 15    Comment: Performed at [facility] [address] [place], [place] [address]  CBC with Differential/Platelet     Status: Abnormal   Collection Time: 07/08/18  3:41 PM  Result Value Ref Range   WBC 14.6 (H) 4.0 - 10.5 K/uL   RBC 4.93 4.22 - 5.81 MIL/uL   Hemoglobin 10.8 (L) 13.0 - 17.0 g/dL   HCT 35.1 (L) 39.0 - 52.0 %   MCV  71.2 (L) 78.0 - 100.0 fL   MCH 21.9 (L) 26.0 - 34.0 pg   MCHC 30.8 30.0 - 36.0 g/dL   RDW 16.4 (H) 11.5 - 15.5 %   Platelets 197 150 - 400 K/uL   Neutrophils Relative % 66 %   Lymphocytes Relative 13 %   Monocytes Relative 18 %   Eosinophils Relative 2 %   Basophils Relative 1 %   Neutro Abs 9.7 (H) 1.7 - 7.7 K/uL   Lymphs Abs 1.9 0.7 - 4.0 K/uL   Monocytes Absolute 2.6 (H) 0.1 - 1.0 K/uL   Eosinophils Absolute 0.3 0.0 - 0.7 K/uL   Basophils Absolute 0.1 0.0 - 0.1 K/uL   RBC Morphology POLYCHROMASIA PRESENT     Comment: ELLIPTOCYTES   WBC Morphology MILD LEFT SHIFT (1-5% METAS, OCC MYELO, OCC BANDS)     Comment: FEW ATYPICAL LYMPH NOTED TOXIC GRANULATION Performed at [facility], [address] [place], [place] [address]   APTT     Status: Abnormal   Collection Time: 07/08/18  3:41 PM  Result Value Ref Range   aPTT 75 (H) 24 - 36 seconds    Comment:        IF BASELINE aPTT IS ELEVATED, SUGGEST PATIENT RISK ASSESSMENT BE USED TO DETERMINE APPROPRIATE ANTICOAGULANT THERAPY. Performed at [facility], [place] [address], [place], [place] [address]   Glucose, capillary     Status: Abnormal   Collection Time: 07/08/18  8:23 PM  Result Value Ref Range   Glucose-Capillary 124 (H) 70 - 99 mg/dL   Comment 1 Notify RN   CBC with Differential/Platelet     Status: Abnormal   Collection Time: 07/08/18  9:46 PM  Result Value Ref Range   WBC 12.5 (H) 4.0 - 10.5 K/uL   RBC 4.33 4.22 - 5.81 MIL/uL   Hemoglobin 9.6 (L) 13.0 - 17.0 g/dL   HCT 30.8 (L) 39.0 - 52.0 %   MCV 71.1 (L) 78.0 - 100.0 fL   MCH 22.2 (L) 26.0 - 34.0 pg   MCHC 31.2 30.0 - 36.0 g/dL  RDW 15.7 (H) 11.5 - 15.5 %   Platelets 186 150 - 400 K/uL   Neutrophils Relative % 67 %   Lymphocytes Relative 14 %   Monocytes Relative 16 %   Eosinophils Relative 2 %   Basophils Relative 1 %   Neutro Abs 8.3 (H) 1.7 - 7.7 K/uL   Lymphs Abs 1.8 0.7 - 4.0 K/uL   Monocytes Absolute 2.0 (H) 0.1 - 1.0 K/uL    Eosinophils Absolute 0.3 0.0 - 0.7 K/uL   Basophils Absolute 0.1 0.0 - 0.1 K/uL   RBC Morphology POLYCHROMASIA PRESENT     Comment: ELLIPTOCYTES   WBC Morphology MILD LEFT SHIFT (1-5% METAS, OCC MYELO, OCC BANDS)     Comment: TOXIC GRANULATION Performed at [facility] [address] [place], [place] [address]   Glucose, capillary     Status: Abnormal   Collection Time: 07/08/18 11:47 PM  Result Value Ref Range   Glucose-Capillary 110 (H) 70 - 99 mg/dL   Comment 1 Notify RN   CBC with Differential/Platelet     Status: Abnormal   Collection Time: 07/09/18  3:42 AM  Result Value Ref Range   WBC 11.5 (H) 4.0 - 10.5 K/uL   RBC 4.22 4.22 - 5.81 MIL/uL   Hemoglobin 9.2 (L) 13.0 - 17.0 g/dL   HCT 30.2 (L) 39.0 - 52.0 %   MCV 71.6 (L) 78.0 - 100.0 fL   MCH 21.8 (L) 26.0 - 34.0 pg   MCHC 30.5 30.0 - 36.0 g/dL   RDW 15.8 (H) 11.5 - 15.5 %   Platelets 192 150 - 400 K/uL   Neutrophils Relative % 61 %   Neutro Abs 7.1 1.7 - 7.7 K/uL   Lymphocytes Relative 16 %   Lymphs Abs 1.8 0.7 - 4.0 K/uL   Monocytes Relative 16 %   Monocytes Absolute 1.8 (H) 0.1 - 1.0 K/uL   Eosinophils Relative 2 %   Eosinophils Absolute 0.3 0.0 - 0.7 K/uL   Basophils Relative 0 %   Basophils Absolute 0.0 0.0 - 0.1 K/uL   Immature Granulocytes 5 %   Abs Immature Granulocytes 0.5 (H) 0.0 - 0.1 K/uL    Comment: Performed at [facility], [address] [place], [place] [address]  Renal function panel (daily at 0500)     Status: Abnormal   Collection Time: 07/09/18  3:42 AM  Result Value Ref Range   Sodium 139 135 - 145 mmol/L   Potassium 4.2 3.5 - 5.1 mmol/L   Chloride 105 98 - 111 mmol/L    Comment: Please note change in reference range.   CO2 24 22 - 32 mmol/L   Glucose, Bld 123 (H) 70 - 99 mg/dL    Comment: Please note change in reference range.   BUN 44 (H) 6 - 20 mg/dL    Comment: Please note change in reference range.   Creatinine, Ser 5.67 (H) 0.61 - 1.24 mg/dL    Comment: DELTA CHECK  NOTED   Calcium 8.1 (L) 8.9 - 10.3 mg/dL   Phosphorus 5.8 (H) 2.5 - 4.6 mg/dL   Albumin 2.3 (L) 3.5 - 5.0 g/dL   GFR calc non Af Amer 10 (L) >60 mL/min   GFR calc Af Amer 12 (L) >60 mL/min    Comment: (NOTE) The eGFR has been calculated using the CKD EPI equation. This calculation has not been validated in all clinical situations. eGFR's persistently <60 mL/min signify possible Chronic Kidney Disease. CORRECTED ON 07/13 AT  6256: PREVIOUSLY REPORTED AS 12    Anion gap 10 5 - 15    Comment: Performed at [facility] [address] [place], [place] [address]  APTT     Status: None   Collection Time: 07/09/18  3:42 AM  Result Value Ref Range   aPTT 36 24 - 36 seconds    Comment: Performed at [facility] [address] [place], [place] [address]  Magnesium     Status: Abnormal   Collection Time: 07/09/18  3:42 AM  Result Value Ref Range   Magnesium 3.1 (H) 1.7 - 2.4 mg/dL    Comment: Performed at [facility] [address], [place], [place] [address]  Glucose, capillary     Status: Abnormal   Collection Time: 07/09/18  3:43 AM  Result Value Ref Range   Glucose-Capillary 114 (H) 70 - 99 mg/dL   Comment 1 Notify RN   Glucose, capillary     Status: Abnormal   Collection Time: 07/09/18  7:40 AM  Result Value Ref Range   Glucose-Capillary 112 (H) 70 - 99 mg/dL   Comment 1 Capillary Specimen    Comment 2 Notify RN   Glucose, capillary     Status: Abnormal   Collection Time: 07/09/18 11:41 AM  Result Value Ref Range   Glucose-Capillary 119 (H) 70 - 99 mg/dL   Comment 1 Capillary Specimen    Comment 2 Notify RN     US Pelvis Limited (transabdominal Only)  Result Date: 07/09/2018 CLINICAL DATA:  Hematuria. EXAM: ULTRASOUND OF THE MALE PELVIS COMPARISON:  06/30/2018. FINDINGS: Bladder: Echogenic, mobile material identified within the bladder. The prevoid bladder volume is equal to 59 cc. Prostate gland: Prostate gland measures 5 x 5 x 4.9 cm with a volume of 65  cc. Seminal vesicles:  Normal IMPRESSION: 1. Prostate gland enlargement. 2. Mobile debris noted within the bladder. Electronically Signed   By: Kerby Moors M.D.   On: 07/09/2018 11:20   Dg Chest Port 1 View  Result Date: 07/08/2018 CLINICAL DATA:  Shortness of breath.  Respiratory failure. EXAM: PORTABLE CHEST 1 VIEW COMPARISON:  Chest radiograph 07/07/2018. FINDINGS: Right IJ central venous catheter sheath projects over the superior vena cava. Left subclavian central venous catheter tip projects over the superior vena cava. Monitoring leads overlie the patient. Stable cardiac and mediastinal contours. Slight interval increase in bilateral interstitial pulmonary opacities. No pleural effusion or pneumothorax. IMPRESSION: Mild increased interstitial opacities may represent edema. Stable support apparatus. Electronically Signed   By: Lovey Newcomer M.D.   On: 07/08/2018 08:46    ROSnegative except above Blood pressure (!) 109/94, pulse 81, temperature 99.2 F (37.3 C), temperature source Oral, resp. rate 20, height _0  (1.803 m), weight 84 kg (185 lb 3 oz), SpO2 93 %. Physical Exampatient currently resting no acute distress abdomen is soft nontender occasional bowel sounds labs and CT reviewed  Assessment/Plan: Seemingly self-limited bright red blood per rectum after rectal tube removed in patient on heparin with multiple medical problems Plan: Will be on standby to help when necessary please call us if our help is needed otherwise if he survives this medical problem would be a candidate for screening colonoscopy at some point  [facility] E 07/09/2018, 12:40 PM

## 2018-07-09 NOTE — Progress Notes (Signed)
Subjective: Movements improve with sleep, to get an IV he was given gabapentin, haldol and precedex.   Exam: Vitals:   07/09/18 0800 07/09/18 0900  BP: 104/83 (!) 109/94  Pulse: 74 81  Resp: 16 20  Temp:    SpO2: 96% 93%   Gen: In bed, NAD Resp: non-labored breathing, no acute distress Abd: soft, nt  Neuro: MS: Follows commands, answers simple questions, no t oriented WU:[id number]:PERRL, corneals intact, looks in both directions, blinks to threat bilaterally.  Motor: withdrawal x 4, continuous choreiform movements.  Sensory:as above.   Impression: 57 yo M with hypoxic injury due to cardiac arrest. He has had some waxing/waning but is currently improving. He will need long term rehabilitation.   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 He endorses RLS symptoms at baseline and will trty a dose of gabapentin to see it it helps.   Recommendations: 1) with haldol being an option, I think this is more likely to help than gabapentin, will d/c gabapentin 2) continue haldol PRN if movements are bothering patient.  3) PT,OT,ST  Ritta SlotMcNeill Dennette Faulconer, MD [facility] [phone]  If 7pm- 7am, please page neurology on call as listed in AMION. 07/09/2018  10:05 AM

## 2018-07-09 NOTE — Progress Notes (Signed)
[facility] Progress Note    Assessment/ Plan:   57 y/o man s/o cardiac arrest with h/o cocaine use now on CRRT w/ echo showing EF 25-30% w/ diffuse HK but w/ repeat showing recovery 55-60% c/w toxic etiology.  1 AKI secondary to arrest. Not exactly sure baseline. Acid/base/K ok.He only has minimal edema in the LE and trying to keep even.  - I stopped the CRRT afternoon of 7/12 as the plan was to stop after Sat evening and the bldg via rectum and GU tract expedited this.  - Plan on iHD tomorrow Wynelle Link(Sun); very difficult today bec of staffing unless there are signs of recovery. - Will f/u on bladder u/s that was req; per urology no foley bec of the large prostate unless there is a massive amount of urine in the bladder.   2 HTn lower vol 3 Anemia stable 4 Arrest 5 Substance abuse 6 VDRF per CCM 7 Enceph per neuro    Subjective:   Bleeding mainly via rectum but small amount from GU tract.  Stopped the CRRT on 7/12.  Denies dyspnea.     Objective:   BP 104/83 (BP Location: Right Arm)   Pulse 74   Temp 98.6 F (37 C) (Oral)   Resp 16   Ht 5\' 11"  (1.803 m)   Wt 84 kg (185 lb 3 oz)   SpO2 96%   BMI 25.83 kg/m   Intake/Output Summary (Last 24 hours) at 07/09/2018 0848 Last data filed at 07/09/2018 0700 Gross per 24 hour  Intake 1607.9 ml  Output 927 ml  Net 680.9 ml   Weight change: 0.1 kg (3.5 oz)  Physical Exam: General appearance:moving head, following simple commands, off precedex to allow feeding Neck:RIJ cath Resp:clear  Cardio:S1, S2 normal and systolic murmur:systolic ejection2/6,decrescendoat 2nd left intercostal space JY:NWGNGI:soft, non-tender; bowel sounds normal; no masses, no organomegaly GU condom cath Extremities:tr edema    Imaging: Dg Chest Port 1 View  Result Date: 07/08/2018 CLINICAL DATA:  Shortness of breath.  Respiratory failure. EXAM: PORTABLE CHEST 1 VIEW COMPARISON:  Chest radiograph 07/07/2018. FINDINGS: Right  IJ central venous catheter sheath projects over the superior vena cava. Left subclavian central venous catheter tip projects over the superior vena cava. Monitoring leads overlie the patient. Stable cardiac and mediastinal contours. Slight interval increase in bilateral interstitial pulmonary opacities. No pleural effusion or pneumothorax. IMPRESSION: Mild increased interstitial opacities may represent edema. Stable support apparatus. Electronically Signed   By: Annia Beltrew  Davis M.D.   On: 07/08/2018 08:46    Labs: BMET Recent Labs  Lab 07/06/18 0407 07/06/18 1731 07/07/18 0351 07/07/18 1555 07/08/18 0357 07/08/18 1541 07/09/18 0342  NA 138  138 138 139 139 139 140 139  K 4.1  4.1 4.2 4.3 4.5 4.3 4.4 4.2  CL 101  101 101 101 103 102 103 105  CO2 25  25 24 25 25 25 25 24   GLUCOSE 89  89 77 80 79 92 102* 123*  BUN 30*  29* 30* 27* 23* 19 28* 44*  CREATININE 3.11*  3.19* 3.32* 3.31* 2.97* 2.86* 2.94* 5.67*  CALCIUM 8.2*  8.3* 8.3* 8.3* 8.2* 8.3* 8.4* 8.1*  PHOS 2.8 2.6 2.3* 2.6 2.5 2.5 5.8*   CBC Recent Labs  Lab 07/08/18 0357 07/08/18 1541 07/08/18 2146 07/09/18 0342  WBC 14.1* 14.6* 12.5* 11.5*  NEUTROABS 9.9* 9.7* 8.3* 7.1  HGB 11.0* 10.8* 9.6* 9.2*  HCT 36.0* 35.1* 30.8* 30.2*  MCV 71.6* 71.2* 71.1* 71.6*  PLT 169  197 186 192    Medications:    . chlorhexidine  15 mL Mouth Rinse BID  . Chlorhexidine Gluconate Cloth  6 each Topical Q0600  . feeding supplement (PRO-STAT SUGAR FREE 64)  60 mL Per Tube BID  . mouth rinse  15 mL Mouth Rinse q12n4p  . pantoprazole  40 mg Oral Q1200  . sodium chloride flush  10-40 mL Intracatheter Q12H      Paulene Floor, MD 07/09/2018, 8:48 AM

## 2018-07-09 NOTE — Evaluation (Signed)
Physical Therapy Evaluation Patient Details Name: Christopher Dominguez MRN: [id number] DOB: Sep 05, 1961 Today's Date: 07/09/2018   History of Present Illness  Pt is a 57 y.o. 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 CT 7/4 showed bilateral occipital edema and R frontoparietal edema, question anoxic brain injury or posterior reversible encephalopathy syndrome; no hemorrhage or midline shift. CT 7/5 showed no acute intracranial abnormality. ETT 7/4-7/10. Acute renal failure; CRRT stopped 7/12. PMH includes depression.    Clinical Impression  Pt presents with an overall decrease in functional mobility secondary to above. Pt very lethargic this session (suspect due to medications), only stating name. Sister present and reports pt lives with girlfriend in [place], independent and works; supportive son and multiple other family members live in [place] and hope for pt to return there (potentially for rehab). Today, pt required totalA to sit EOB, maxA to maintain balance due to lethargy; response to painful stimuli in all extremities and some functional movement noted in extremities, but not consistently to command. Max verbal/tactile stimulation to open eyes. Pt would benefit from continued acute PT services to maximize functional mobility and independence prior to d/c with continued therapies.     Follow Up Recommendations CIR;Supervision/Assistance - 24 hour    Equipment Recommendations  (TBD)    Recommendations for Other Services OT consult;Speech consult;Rehab consult     Precautions / Restrictions Precautions Precautions: Fall Restrictions Weight Bearing Restrictions: No      Mobility  Bed Mobility Overal bed mobility: Needs Assistance Bed Mobility: Supine to Sit;Sit to Supine     Supine to sit: Total assist;HOB elevated Sit to supine: Max assist   General bed mobility comments: TotalA to sitting position, pt  intermittently opening eyes and would kick out legs sitting EOB. MaxA for return to supine, pt able to assist with BLEs  Transfers                 General transfer comment: NT  Ambulation/Gait                Stairs            Wheelchair Mobility    Modified Rankin (Stroke Patients Only)       Balance Overall balance assessment: Needs assistance Sitting-balance support: Feet unsupported;No upper extremity supported Sitting balance-Leahy Scale: Poor Sitting balance - Comments: 2x engaging core to prevent falling backwards, but overall maxA                                     Pertinent Vitals/Pain      Home Living Family/patient expects to be discharged to:: Private residence Living Arrangements: Spouse/significant other(Girlfriend) Available Help at Discharge: Family;Friend(s);Available PRN/intermittently Type of Home: Apartment           Additional Comments: Sister reports pt lives in apartment with girflriend. Son and many other family members live in [place]; potentially plan to have pt do rehab in [place]    Prior Function Level of Independence: Independent         Comments: Sister reports last she knew, pt was working at [employer]        Extremity/Trunk Assessment   Upper Extremity Assessment Upper Extremity Assessment: RUE deficits/detail;LUE deficits/detail;Difficult to assess due to impaired cognition(Not moving to command- would grip finger (at least 3/5), strongly resist R elbow flexion, not  active shoulder movement observed)    Lower Extremity Assessment Lower Extremity Assessment: RLE deficits/detail;LLE deficits/detail;Difficult to assess due to impaired cognition RLE Deficits / Details: Not moving to command- observed at least 3/5 knee flex/ext LLE Deficits / Details: Not moving to command- observed at least 3/5 knee flex/ext       Communication      Cognition Arousal/Alertness:  Lethargic;Suspect due to medications Behavior During Therapy: Flat affect Overall Cognitive Status: Impaired/Different from baseline                                 General Comments: Max verbal/tactile stimulation for pt to open eyes. Able to state name. Intermittently shaking head yes/no; not consistently appropriate. Per sister, pt has been conversing appropriately with her      General Comments General comments (skin integrity, edema, etc.): Sister present during session    Exercises     Assessment/Plan    PT Assessment Patient needs continued PT services  PT Problem List Decreased strength;Decreased activity tolerance;Decreased balance;Decreased mobility;Decreased coordination;Decreased cognition;Decreased knowledge of use of DME;Decreased safety awareness       PT Treatment Interventions DME instruction;Gait training;Stair training;Functional mobility training;Therapeutic activities;Therapeutic exercise;Neuromuscular re-education;Cognitive remediation;Patient/family education;Balance training;Wheelchair mobility training    PT Goals (Current goals can be found in the Care Plan section)  Acute Rehab PT Goals Patient Stated Goal: Get continued rehab after leaving hospital PT Goal Formulation: With family Time For Goal Achievement: 07/23/18 Potential to Achieve Goals: Good    Frequency Min 3X/week   Barriers to discharge Inaccessible home environment;Decreased caregiver support      Co-evaluation               AM-PAC PT "6 Clicks" Daily Activity  Outcome Measure Difficulty turning over in bed (including adjusting bedclothes, sheets and blankets)?: Unable Difficulty moving from lying on back to sitting on the side of the bed? : Unable Difficulty sitting down on and standing up from a chair with arms (e.g., wheelchair, bedside commode, etc,.)?: Unable Help needed moving to and from a bed to chair (including a wheelchair)?: Total Help needed walking in  hospital room?: Total Help needed climbing 3-5 steps with a railing? : Total 6 Click Score: 6    End of Session   Activity Tolerance: Patient limited by lethargy Patient left: in bed;with call bell/phone within reach;with bed alarm set Nurse Communication: Mobility status PT Visit Diagnosis: Other abnormalities of gait and mobility (R26.89);Other symptoms and signs involving the nervous system (R29.898)    Time: [id number] PT Time Calculation (min) (ACUTE ONLY): 19 min   Charges:   PT Evaluation $PT Eval Moderate Complexity: 1 Mod     PT G Codes:       Ina HomesJaclyn Sava Proby, PT, DPT Acute Rehab Services  Pager: [phone]  Malachy ChamberJaclyn L Robert Sunga 07/09/2018, 12:03 PM

## 2018-07-10 LAB — CBC WITH DIFFERENTIAL/PLATELET
Abs Immature Granulocytes: 0.3 10*3/uL — ABNORMAL HIGH (ref 0.0–0.1)
Basophils Absolute: 0.1 10*3/uL (ref 0.0–0.1)
Basophils Relative: 0 %
EOS PCT: 2 %
Eosinophils Absolute: 0.3 10*3/uL (ref 0.0–0.7)
HEMATOCRIT: 31.4 % — AB (ref 39.0–52.0)
Hemoglobin: 9.6 g/dL — ABNORMAL LOW (ref 13.0–17.0)
Immature Granulocytes: 2 %
Lymphocytes Relative: 16 %
Lymphs Abs: 1.9 10*3/uL (ref 0.7–4.0)
MCH: 21.9 pg — ABNORMAL LOW (ref 26.0–34.0)
MCHC: 30.6 g/dL (ref 30.0–36.0)
MCV: 71.7 fL — AB (ref 78.0–100.0)
MONO ABS: 1.2 10*3/uL — AB (ref 0.1–1.0)
MONOS PCT: 10 %
Neutro Abs: 8.3 10*3/uL — ABNORMAL HIGH (ref 1.7–7.7)
Neutrophils Relative %: 70 %
Platelets: 233 10*3/uL (ref 150–400)
RBC: 4.38 MIL/uL (ref 4.22–5.81)
RDW: 16 % — ABNORMAL HIGH (ref 11.5–15.5)
WBC: 12 10*3/uL — ABNORMAL HIGH (ref 4.0–10.5)

## 2018-07-10 LAB — GLUCOSE, CAPILLARY
GLUCOSE-CAPILLARY: 98 mg/dL (ref 70–99)
Glucose-Capillary: 100 mg/dL — ABNORMAL HIGH (ref 70–99)
Glucose-Capillary: 95 mg/dL (ref 70–99)
Glucose-Capillary: 121 mg/dL — ABNORMAL HIGH (ref 70–99)
Glucose-Capillary: 105 mg/dL — ABNORMAL HIGH (ref 70–99)

## 2018-07-10 LAB — BASIC METABOLIC PANEL
Anion gap: 11 (ref 5–15)
BUN: 78 mg/dL — ABNORMAL HIGH (ref 6–20)
CHLORIDE: 110 mmol/L (ref 98–111)
CO2: 21 mmol/L — AB (ref 22–32)
CREATININE: 8.81 mg/dL — AB (ref 0.61–1.24)
Calcium: 8.2 mg/dL — ABNORMAL LOW (ref 8.9–10.3)
GFR calc non Af Amer: 6 mL/min — ABNORMAL LOW (ref >60)
GFR, EST AFRICAN AMERICAN: 7 mL/min — AB (ref >60)
Glucose, Bld: 110 mg/dL — ABNORMAL HIGH (ref 70–99)
Potassium: 4.2 mmol/L (ref 3.5–5.1)
Sodium: 142 mmol/L (ref 135–145)

## 2018-07-10 LAB — RENAL FUNCTION PANEL
ALBUMIN: 2.5 g/dL — AB (ref 3.5–5.0)
Anion gap: 12 (ref 5–15)
BUN: 76 mg/dL — AB (ref 6–20)
CO2: 20 mmol/L — ABNORMAL LOW (ref 22–32)
CREATININE: 8.82 mg/dL — AB (ref 0.61–1.24)
Calcium: 8.4 mg/dL — ABNORMAL LOW (ref 8.9–10.3)
Chloride: 110 mmol/L (ref 98–111)
GFR, EST AFRICAN AMERICAN: 7 mL/min — AB (ref >60)
GFR, EST NON AFRICAN AMERICAN: 6 mL/min — AB (ref >60)
Glucose, Bld: 108 mg/dL — ABNORMAL HIGH (ref 70–99)
PHOSPHORUS: 5.9 mg/dL — AB (ref 2.5–4.6)
Potassium: 4.3 mmol/L (ref 3.5–5.1)
Sodium: 142 mmol/L (ref 135–145)

## 2018-07-10 LAB — MAGNESIUM: MAGNESIUM: 3.5 mg/dL — AB (ref 1.7–2.4)

## 2018-07-10 LAB — APTT: APTT: 36 s (ref 24–36)

## 2018-07-10 NOTE — Progress Notes (Signed)
Patient sleeping, has pulled most of his leads off and is curled up in a tight ball. When staff attempts to reattach leads he pushes them away and curls up even more. Dr. Minette HeadlandAljishi is aware and is ok if if staff waits to replace leads until patient is more cooperative. Patient has done well today and has not required any sedation, he is more responsive and has been cooperative for most of the shift. He had dialysis today and was cooperative throughout the treatment. Family is at the bedside at this time.

## 2018-07-10 NOTE — Progress Notes (Signed)
PULMONARY / CRITICAL CARE MEDICINE   Name: Christopher KaufmannShelton M Dominguez MRN: [id number] DOB: [birth year]    ADMISSION DATE:  06/30/2018  CHIEF COMPLAINT:  Cardiac arrest  HISTORY OF PRESENT ILLNESS:   57 y/o male had a cardiac arrest out of hospital and received 7 rounds of epinephrine to regain a pulse.     SUBJECTIVE:  Patient is asleep as per night nurse patient did require Precedex until 10 PM last night he did respond very well to the Haldol.  No major events overnight.  VITAL SIGNS: BP 130/83   Pulse 83   Temp 98.3 F (36.8 C) (Oral)   Resp 20   Ht 5\' 11"  (1.803 m)   Wt 87.9 kg (193 lb 12.6 oz)   SpO2 98%   BMI 27.03 kg/m   HEMODYNAMICS:    VENTILATOR SETTINGS:    INTAKE / OUTPUT: I/O last 3 completed shifts: In: 3475.5 [P.O.:1800; I.V.:1438.5; NG/GT:237] Out: -   PHYSICAL EXAMINATION:  General: 57 year old male patient currently on nasal canula sleeping  HEENT normocephalic atraumatic orally intubated Pulmonary: Clear to auscultation mechanically assisted breaths Cardiac: Regular rate and rhythm Abdomen: Soft nontender Extremities: Brisk cap refill, generalized edema.  Warm, strong pulses. Neuro: Sleeping less noticeable chorea movements  LABS:  BMET Recent Labs  Lab 07/08/18 1541 07/09/18 0342 07/10/18 0240  NA 140 139 142  142  K 4.4 4.2 4.2  4.3  CL 103 105 110  110  CO2 25 24 21*  20*  BUN 28* 44* 78*  76*  CREATININE 2.94* 5.67* 8.81*  8.82*  GLUCOSE 102* 123* 110*  108*    Electrolytes Recent Labs  Lab 07/08/18 0357 07/08/18 1541 07/09/18 0342 07/10/18 0240  CALCIUM 8.3* 8.4* 8.1* 8.2*  8.4*  MG 2.8*  --  3.1* 3.5*  PHOS 2.5 2.5 5.8* 5.9*    CBC Recent Labs  Lab 07/08/18 2146 07/09/18 0342 07/10/18 0240  WBC 12.5* 11.5* 12.0*  HGB 9.6* 9.2* 9.6*  HCT 30.8* 30.2* 31.4*  PLT 186 192 233    Coag's Recent Labs  Lab 07/08/18 1541 07/09/18 0342 07/10/18 0240  APTT 75* 36 36    Sepsis Markers No results for input(s):  LATICACIDVEN, PROCALCITON, O2SATVEN in the last 168 hours.  ABG Recent Labs  Lab 07/06/18 1055  PHART 7.423  PCO2ART 36.7  PO2ART 155.0*    Liver Enzymes Recent Labs  Lab 07/04/18 0240  07/07/18 0351  07/08/18 1541 07/09/18 0342 07/10/18 0240  AST 247*  --  40  --   --   --   --   ALT 1,173*  --  333*  --   --   --   --   ALKPHOS 65  --  81  --   --   --   --   BILITOT 0.8  --  1.0  --   --   --   --   ALBUMIN 2.2*  2.2*   < > 2.6*  2.6*   < > 2.6* 2.3* 2.5*   < > = values in this interval not displayed.    Cardiac Enzymes No results for input(s): TROPONINI, PROBNP in the last 168 hours.  Glucose Recent Labs  Lab 07/09/18 0343 07/09/18 0740 07/09/18 1141 07/09/18 1606 07/09/18 2137 07/10/18 0332  GLUCAP 114* 112* 119* 131* 96 100*    Imaging Kore[place] Pelvis Limited (transabdominal Only)  Result Date: 07/09/2018 CLINICAL DATA:  Hematuria. EXAM: ULTRASOUND OF THE MALE PELVIS COMPARISON:  06/30/2018. FINDINGS: Bladder: Echogenic,  mobile material identified within the bladder. The prevoid bladder volume is equal to 59 cc. Prostate gland: Prostate gland measures 5 x 5 x 4.9 cm with a volume of 65 cc. Seminal vesicles:  Normal IMPRESSION: 1. Prostate gland enlargement. 2. Mobile debris noted within the bladder. Electronically Signed   By: Signa Kell M.D.   On: 07/09/2018 11:20     STUDIES:  7/4 CT of the head worrisome for anoxic injury EEG 7/5 > diffuse cerebral disfunction that is non-specific TTE 7/5 LVEF 25-30%, mild LVD, enlarged RV with severe RV dysfuncion CT head 7/5 > no acute intracranial abnormality, prior CT findings may have been artifact  ANTIBIOTICS: Zosyn 7/4>>> 7/10  SIGNIFICANT EVENTS:  7/4>>> cardiac arrest 7/6 >>> spontaneous movements, pain response, CVVHD 7/7 >> early AM myoclonus  LINES/TUBES: ETT 7/4>>> 7/10 L IJ TLC 7/4>>> R radial a-line 7/4>>> R IJ HD cath >>   Cultures: Blood 7/4>>> C. Diff 7/4 >> neg  DISCUSSION: 57  y/o male with minimal past medical history here after cardiac arrest.  ASSESSMENT / PLAN: Resolved Issues Shock Vomiting (7/8) Cardiogenic shock   Active issues Acute respiratory failure with hypoxemia  Now off mechanical ventilation  Aspiration pneumonia  - plan Extubated 7/10 and doing well on [place]  Finished ABx course    s/p Cardiac arrest w/ presumed toxic Cardiomyopathy d/t cocaine  HTN -EF has improved from 25-30% to 55-60% w/out current evidence of WM abnormality Trop elevation felt to be d/t CPR and not ACS Plan Cont tele monitoring Drug abuse cessation  Eventually ischemia eval before going home PRN hydralazine   Hematuria - US bladder today - heparin [place] on hold - we appreciate urology input   Rectal Bleeding  - GI consulted yesterday we appreciate their follow up - heparin sq held  - Hb stable   acute  renal failure -stopped CRRT on 7/12 due to hematuria and rectal bleeding. Was Started on 7/7   Plan - we appreciate nephro follow up -Resume hemodialysis intermittent - Ultrasound bladder was done yesterday showed only 59 cc also showed enlarged prostate - Repeat daily chemistries  - Strict I&O - Avoid foley catheter insertion as per urology since very large prostate unless residual >500     Shock liver acute transaminitis -->trending down  Plan Improved     suspected anoxic brain injury Now with chorea movements Serial exams showing slow improvement  We appreciate neuro follow up  Plan Appreciate neuro input  Patient is responding very nicely to Haldol as needed Follow EKG to follow QT intervals Stopped precedex again last night.  Patient was restarted on Precedex yesterday due to his severe chorea movements and not able to stay still in bed.  He does respond very nicely to Haldol.  Patient will be observed in the intensive care and if remains off Precedex for more than 24 hours and can be transferred to the floor.    FAMILY  - Updates:  no family at bedside   - Inter-disciplinary family meet or Palliative Care meeting due by:  day 7    Mateo Flow MD [facility] Pulmonary/Critical Care Pager  [phone]    07/10/2018, 8:52 AM

## 2018-07-10 NOTE — Progress Notes (Signed)
SLP Cancellation Note  Patient Details Name: Christopher KaufmannShelton M Sousa MRN: [id number] DOB: 20-Jun-1961   Cancelled treatment:       Reason Eval/Treat Not Completed: Other (comment)(pt's dentures not in hospital yet- RN reports girlfriend is trying to arrange to get them here today, RN reports pt with improved mentation and good tolerance of po diet, will follow up next date)   Chales AbrahamsKimball, Kareema Keitt Ann 07/10/2018, 1:02 PM  Donavan Burnetamara Evelynn Hench, MS [facility] SLP [phone]

## 2018-07-10 NOTE — Progress Notes (Signed)
Subjective: Continues to improve.   Exam: Vitals:   07/10/18 0840 07/10/18 0900  BP:  135/81  Pulse:  84  Resp:  (!) 35  Temp: 98.3 F (36.8 C)   SpO2:  99%   Gen: In bed, NAD Resp: non-labored breathing, no acute distress Abd: soft, nt  Neuro: MS: Follows commands, answers simple questions, not oriented ZO:XWRUECN:PERRL, corneals intact, looks in both directions, blinks to threat bilaterally.  Motor: movements appear to be improving, though still slightl involuntary movements.  Sensory:as above.   Impression: 57 yo M with hypoxic injury due to cardiac arrest. He has had some waxing/waning but is currently improving. He will need long term rehabilitation.   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  Anti-dopaminergic medications can be helpful for this, but I suspec tthey will improve over time.   Recommendations: 1) continue haldol PRN if movements are bothering patient. I would not continue tohis lng term.  2) if his movements are interfering with rehab and and a less sedating, more regular alternative is wanted, could try gabapentin, 300mg  BID 3) No further recommendations at this time, if he continues to have deficits/ abnormal movements after rehab, then outpatient neurology follow up could be scheduled. Please call with further questions or concerns.   Ritta SlotMcNeill Cruise Baumgardner, MD [facility] [phone]  If 7pm- 7am, please page neurology on call as listed in AMION. 07/10/2018  9:57 AM

## 2018-07-10 NOTE — Progress Notes (Signed)
[facility] Progress Note    Assessment/ Plan:   57 y/o man s/o cardiac arrest with h/o cocaine use now on CRRT w/ echo showing EF 25-30% w/ diffuse HK but w/ repeat showing recovery 55-60% c/w toxic etiology.  1 AKI secondary to arrest. Not exactly sure baseline.Acid/base/K ok.He only has minimal edema in the LE and trying to keep even.  - I stopped the CRRT afternoon of 7/12 as the plan was to stop after Sat evening and the bldg via rectum and GU tract expedited this.  - Plan on iHD today (Sun); still no signs of recovery. No UF for now to hopefully facilitate renal recovery.   2 HTn - controlled 3 Anemia stable 4 Arrest 5 Substance abuse 6 VDRF per CCM 7 Enceph per neuro     Subjective:   Bleeding via rectum but small amount from GU tract -> much less c/w prior day.  Stopped the CRRT on 7/12.  Denies dyspnea.  Awake and alert, cooperative answering questions and following commands.   Objective:   BP 135/81   Pulse 84   Temp 98.3 F (36.8 C) (Oral)   Resp (!) 35   Ht 5\' 11"  (1.803 m)   Wt 87.9 kg (193 lb 12.6 oz)   SpO2 99%   BMI 27.03 kg/m   Intake/Output Summary (Last 24 hours) at 07/10/2018 1022 Last data filed at 07/10/2018 0900 Gross per 24 hour  Intake 2243.43 ml  Output -  Net 2243.43 ml   Weight change: 3.9 kg (8 lb 9.6 oz)  Physical Exam: General appearance:moving head,following simple commands, off precedex Neck:RIJcath Resp:clear Cardio:S1, S2 normal and systolic murmur:systolic ejection2/6,decrescendoat 2nd left intercostal space JY:NWGNGI:soft, non-tender; bowel sounds normal; no masses, no organomegaly GUcondom cath Extremities:tredema    Imaging: [place] Pelvis Limited (transabdominal Only)  Result Date: 07/09/2018 CLINICAL DATA:  Hematuria. EXAM: ULTRASOUND OF THE MALE PELVIS COMPARISON:  06/30/2018. FINDINGS: Bladder: Echogenic, mobile material identified within the bladder. The prevoid bladder volume  is equal to 59 cc. Prostate gland: Prostate gland measures 5 x 5 x 4.9 cm with a volume of 65 cc. Seminal vesicles:  Normal IMPRESSION: 1. Prostate gland enlargement. 2. Mobile debris noted within the bladder. Electronically Signed   By: Signa Kellaylor  Stroud M.D.   On: 07/09/2018 11:20    Labs: BMET Recent Labs  Lab 07/06/18 1731 07/07/18 0351 07/07/18 1555 07/08/18 0357 07/08/18 1541 07/09/18 0342 07/10/18 0240  NA 138 139 139 139 140 139 142  142  K 4.2 4.3 4.5 4.3 4.4 4.2 4.2  4.3  CL 101 101 103 102 103 105 110  110  CO2 24 25 25 25 25 24  21*  20*  GLUCOSE 77 80 79 92 102* 123* 110*  108*  BUN 30* 27* 23* 19 28* 44* 78*  76*  CREATININE 3.32* 3.31* 2.97* 2.86* 2.94* 5.67* 8.81*  8.82*  CALCIUM 8.3* 8.3* 8.2* 8.3* 8.4* 8.1* 8.2*  8.4*  PHOS 2.6 2.3* 2.6 2.5 2.5 5.8* 5.9*   CBC Recent Labs  Lab 07/08/18 1541 07/08/18 2146 07/09/18 0342 07/10/18 0240  WBC 14.6* 12.5* 11.5* 12.0*  NEUTROABS 9.7* 8.3* 7.1 8.3*  HGB 10.8* 9.6* 9.2* 9.6*  HCT 35.1* 30.8* 30.2* 31.4*  MCV 71.2* 71.1* 71.6* 71.7*  PLT 197 186 192 233    Medications:    . chlorhexidine  15 mL Mouth Rinse BID  . Chlorhexidine Gluconate Cloth  6 each Topical Q0600  . feeding supplement (NEPRO CARB STEADY)  237 mL  Oral BID BM  . pantoprazole  40 mg Oral Q1200      Paulene Floor, MD 07/10/2018, 10:22 AM

## 2018-07-10 NOTE — Progress Notes (Signed)
Subjective/Chief Complaint:  1 - Renal Failure - Cr 4's 06/2018 after MI / anoxic injury up from baseline of 1.2 2017. CT 06/2018 no hydro. Placed on CRRT with goal of eventual IHD. Making minimal urine. CRRT stopped 7/13 in face of hematuria and improving hemodynamics.   2 - Hematuria with Massive Prostate - new gross hematuria during admission for MI / anoxic injury 06/2018 after attempt IO cath. Known massive prostate at 150mL by CT calculation 2019.    Today "[name]" is seen stable. Much less movement problems with haldol yesterday, much more calm / cooperative. Minimal urine production last 24 hrs.     Objective: Vital signs in last 24 hours: Temp:  [98.3 F (36.8 C)-101.1 F (38.4 C)] 98.3 F (36.8 C) (07/14 0840) Pulse Rate:  [33-100] 83 (07/14 0800) Resp:  [15-34] 20 (07/14 0800) BP: (108-149)/(70-98) 130/83 (07/14 0800) SpO2:  [88 %-100 %] 98 % (07/14 0800) Weight:  [87.9 kg (193 lb 12.6 oz)] 87.9 kg (193 lb 12.6 oz) (07/14 0408) Last BM Date: 07/09/18  Intake/Output from previous day: 07/13 0701 - 07/14 0700 In: 2711.4 [P.O.:1800; I.V.:674.4; NG/GT:237] Out: -  Intake/Output this shift: Total I/O In: 100 [I.V.:100] Out: -   Physical Exam  Constitutional:  Somewhat improved metnal acutiy. Much improved involuntary movements.   HENT:  Rt neck catheter in place and not connected.   Cardiovascular: Normal rate.  Respiratory: Effort normal.  GI: Soft.  No scars. NO distnesion. NO SP TTP.   Genitourinary: Penis normal.  Genitourinary Comments: NO CVAT. No blood at meatus.   Neurological:  AOx1 at present (improved some) Skin: Skin is warm.      Lab Results:  Recent Labs    07/09/18 0342 07/10/18 0240  WBC 11.5* 12.0*  HGB 9.2* 9.6*  HCT 30.2* 31.4*  PLT 192 233   BMET Recent Labs    07/09/18 0342 07/10/18 0240  NA 139 142  142  K 4.2 4.2  4.3  CL 105 110  110  CO2 24 21*  20*  GLUCOSE 123* 110*  108*  BUN 44* 78*  76*   CREATININE 5.67* 8.81*  8.82*  CALCIUM 8.1* 8.2*  8.4*   PT/INR No results for input(s): LABPROT, INR in the last 72 hours. ABG No results for input(s): PHART, HCO3 in the last 72 hours.  Invalid input(s): PCO2, PO2  Studies/Results: [place] Pelvis Limited (transabdominal Only)  Result Date: 07/09/2018 CLINICAL DATA:  Hematuria. EXAM: ULTRASOUND OF THE MALE PELVIS COMPARISON:  06/30/2018. FINDINGS: Bladder: Echogenic, mobile material identified within the bladder. The prevoid bladder volume is equal to 59 cc. Prostate gland: Prostate gland measures 5 x 5 x 4.9 cm with a volume of 65 cc. Seminal vesicles:  Normal IMPRESSION: 1. Prostate gland enlargement. 2. Mobile debris noted within the bladder. Electronically Signed   By: Signa Kellaylor  Stroud M.D.   On: 07/09/2018 11:20    Anti-infectives: Anti-infectives (From admission, onward)   Start     Dose/Rate Route Frequency Ordered Stop   07/06/18 1200  piperacillin-tazobactam (ZOSYN) IVPB 3.375 g    Note to Pharmacy:  Please stop after today   3.375 g 100 mL/hr over 30 Minutes Intravenous Every 6 hours 07/06/18 1015 07/06/18 1932   07/02/18 1800  piperacillin-tazobactam (ZOSYN) IVPB 3.375 g  Status:  Discontinued     3.375 g 12.5 mL/hr over 240 Minutes Intravenous Every 6 hours 07/02/18 1309 07/02/18 1310   07/02/18 1800  piperacillin-tazobactam (ZOSYN) IVPB 3.375 g  Status:  Discontinued     3.375 g 100 mL/hr over 30 Minutes Intravenous Every 6 hours 07/02/18 1311 07/06/18 1015   07/02/18 1200  piperacillin-tazobactam (ZOSYN) IVPB 2.25 g  Status:  Discontinued     2.25 g 100 mL/hr over 30 Minutes Intravenous Every 6 hours 07/02/18 0728 07/02/18 1309   07/01/18 0100  piperacillin-tazobactam (ZOSYN) IVPB 3.375 g  Status:  Discontinued     3.375 g 12.5 mL/hr over 240 Minutes Intravenous Every 8 hours 06/30/18 1812 07/02/18 0728   06/30/18 1815  piperacillin-tazobactam (ZOSYN) IVPB 3.375 g     3.375 g 100 mL/hr over 30 Minutes Intravenous   Once 06/30/18 1804 06/30/18 1945      Assessment/Plan:   1 - Renal Failure - likely due to severe ischemic injury. NO sig hydro to suggests substantial obstructive component. Agree with dialysis for with goal of IHD to minimize heparin exposure in setting of hematuria. His near anuria does not bode well.   2 - Hematuria with Massive Prostate - CT w/o upper tract lesions. This is likely from prior foley trauma in setting of massive prostate and heparin. Recommend STRONGLY AGAINST any catheters (even I/O) as this will only exacerbate problem unless frank retention (true post-void residuals of >524mL). 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    Agree with PRN haldol. His overall neurologic status (mental acutiy and movement) seem improved with this.   Very very challenging medical and social situation. Please call me directly with questions anytime.      [facility], Joylynn Defrancesco 07/10/2018

## 2018-07-11 ENCOUNTER — Encounter (HOSPITAL_COMMUNITY): Payer: Self-pay | Admitting: Pulmonary Disease

## 2018-07-11 LAB — CBC WITH DIFFERENTIAL/PLATELET
ABS IMMATURE GRANULOCYTES: 0.1 10*3/uL (ref 0.0–0.1)
BASOS ABS: 0 10*3/uL (ref 0.0–0.1)
Basophils Relative: 0 %
Eosinophils Absolute: 0.2 10*3/uL (ref 0.0–0.7)
Eosinophils Relative: 2 %
HCT: 28.2 % — ABNORMAL LOW (ref 39.0–52.0)
HEMOGLOBIN: 8.8 g/dL — AB (ref 13.0–17.0)
Immature Granulocytes: 1 %
LYMPHS PCT: 15 %
Lymphs Abs: 1.6 10*3/uL (ref 0.7–4.0)
MCH: 21.9 pg — ABNORMAL LOW (ref 26.0–34.0)
MCHC: 31.2 g/dL (ref 30.0–36.0)
MCV: 70.1 fL — ABNORMAL LOW (ref 78.0–100.0)
Monocytes Absolute: 0.9 10*3/uL (ref 0.1–1.0)
Monocytes Relative: 8 %
NEUTROS ABS: 7.9 10*3/uL — AB (ref 1.7–7.7)
NEUTROS PCT: 74 %
Platelets: 282 10*3/uL (ref 150–400)
RBC: 4.02 MIL/uL — AB (ref 4.22–5.81)
RDW: 15.6 % — ABNORMAL HIGH (ref 11.5–15.5)
WBC: 10.7 10*3/uL — AB (ref 4.0–10.5)

## 2018-07-11 LAB — RENAL FUNCTION PANEL
Albumin: 2.3 g/dL — ABNORMAL LOW (ref 3.5–5.0)
Anion gap: 10 (ref 5–15)
BUN: 55 mg/dL — ABNORMAL HIGH (ref 6–20)
CHLORIDE: 106 mmol/L (ref 98–111)
CO2: 27 mmol/L (ref 22–32)
Calcium: 8 mg/dL — ABNORMAL LOW (ref 8.9–10.3)
Creatinine, Ser: 8.46 mg/dL — ABNORMAL HIGH (ref 0.61–1.24)
GFR, EST AFRICAN AMERICAN: 7 mL/min — AB (ref >60)
GFR, EST NON AFRICAN AMERICAN: 6 mL/min — AB (ref >60)
Glucose, Bld: 110 mg/dL — ABNORMAL HIGH (ref 70–99)
POTASSIUM: 4.2 mmol/L (ref 3.5–5.1)
Phosphorus: 4.5 mg/dL (ref 2.5–4.6)
Sodium: 143 mmol/L (ref 135–145)

## 2018-07-11 LAB — GLUCOSE, CAPILLARY
GLUCOSE-CAPILLARY: 110 mg/dL — AB (ref 70–99)
GLUCOSE-CAPILLARY: 116 mg/dL — AB (ref 70–99)
GLUCOSE-CAPILLARY: 93 mg/dL (ref 70–99)
GLUCOSE-CAPILLARY: 98 mg/dL (ref 70–99)
Glucose-Capillary: 109 mg/dL — ABNORMAL HIGH (ref 70–99)
Glucose-Capillary: 112 mg/dL — ABNORMAL HIGH (ref 70–99)
Glucose-Capillary: 109 mg/dL — ABNORMAL HIGH (ref 70–99)
Glucose-Capillary: 109 mg/dL — ABNORMAL HIGH (ref 70–99)

## 2018-07-11 LAB — APTT: APTT: 34 s (ref 24–36)

## 2018-07-11 LAB — MAGNESIUM: Magnesium: 2.6 mg/dL — ABNORMAL HIGH (ref 1.7–2.4)

## 2018-07-11 LAB — HEPATITIS B CORE ANTIBODY, TOTAL: Hep B Core Total Ab: NEGATIVE

## 2018-07-11 LAB — HEPATITIS B SURFACE ANTIGEN: HEP B S AG: NEGATIVE

## 2018-07-11 MED ORDER — TAMSULOSIN HCL 0.4 MG PO CAPS
0.4000 mg | ORAL_CAPSULE | Freq: Every day | ORAL | Status: DC
  Administered 2018-07-11 – 2018-08-12 (×33): 0.4 mg via ORAL
  Filled 2018-07-11 (×33): qty 1

## 2018-07-11 NOTE — Progress Notes (Signed)
Assessment/ Plan:   57 y/o man h/o cardiac arrest   1 AKI secondary to arrest. S/p CRRT and IHD yesterday  2 HTN - controlled, lower this AM 3 Anemia stable 4 s/p Arrest 5 Substance abuse 6 Enceph per neuro, improving  Subjective: Interval History: Tol HD last night  Objective: Vital signs in last 24 hours: Temp:  [98 F (36.7 C)-100.7 F (38.2 C)] 99 F (37.2 C) (07/15 0730) Pulse Rate:  [74-98] 81 (07/15 0730) Resp:  [15-81] 17 (07/15 0730) BP: (115-164)/(50-104) 128/76 (07/15 0730) SpO2:  [95 %-100 %] 98 % (07/15 0730) Weight:  [87.9 kg (193 lb 12.6 oz)-88.5 kg (195 lb 1.7 oz)] 88.5 kg (195 lb 1.7 oz) (07/15 0600) Weight change: 0 kg (0 lb)  Intake/Output from previous day: 07/14 0701 - 07/15 0700 In: 1001.9 [P.O.:827; I.V.:174.9] Out: 0  Intake/Output this shift: No intake/output data recorded.  General appearance: alert and cooperative Resp: clear to auscultation bilaterally GI: soft, non-tender; bowel sounds normal; no masses,  no organomegaly Extremities: extremities normal, atraumatic, no cyanosis or edema  Lab Results: Recent Labs    07/10/18 0240 07/11/18 0301  WBC 12.0* 10.7*  HGB 9.6* 8.8*  HCT 31.4* 28.2*  PLT 233 282   BMET:  Recent Labs    07/10/18 0240 07/11/18 0301  NA 142  142 143  K 4.2  4.3 4.2  CL 110  110 106  CO2 21*  20* 27  GLUCOSE 110*  108* 110*  BUN 78*  76* 55*  CREATININE 8.81*  8.82* 8.46*  CALCIUM 8.2*  8.4* 8.0*   No results for input(s): PTH in the last 72 hours. Iron Studies: No results for input(s): IRON, TIBC, TRANSFERRIN, FERRITIN in the last 72 hours. Studies/Results: [place] Pelvis Limited (transabdominal Only)  Result Date: 07/09/2018 CLINICAL DATA:  Hematuria. EXAM: ULTRASOUND OF THE MALE PELVIS COMPARISON:  06/30/2018. FINDINGS: Bladder: Echogenic, mobile material identified within the bladder. The prevoid bladder volume is equal to 59 cc. Prostate gland: Prostate gland measures 5 x 5 x 4.9 cm with a  volume of 65 cc. Seminal vesicles:  Normal IMPRESSION: 1. Prostate gland enlargement. 2. Mobile debris noted within the bladder. Electronically Signed   By: Signa Kellaylor  Stroud M.D.   On: 07/09/2018 11:20    Scheduled: . chlorhexidine  15 mL Mouth Rinse BID  . Chlorhexidine Gluconate Cloth  6 each Topical Q0600  . feeding supplement (NEPRO CARB STEADY)  237 mL Oral BID BM  . pantoprazole  40 mg Oral Q1200    LOS: 11 days   Lauris Poaglvin C Rilya Longo 07/11/2018,8:16 AM

## 2018-07-11 NOTE — Progress Notes (Signed)
OT Note Addendum for Charges    07/11/18 1153  OT Visit Information  Last OT Received On 07/11/18  OT General Charges  $OT Visit 1 Visit  OT Evaluation  $OT Eval Moderate Complexity 1 Mod   Oneika Simonian A. Brett Albinooffey, M.S., OTR/L Acute Rehab Department: [phone]

## 2018-07-11 NOTE — Progress Notes (Signed)
Occupational Therapy Evaluation Patient Details Name: Christopher Dominguez MRN: [id number] DOB: [birth year] Today's Date: 07/11/2018    History of Present Illness Pt is a 57 y.o. 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 CT 7/4 showed bilateral occipital edema and R frontoparietal edema, question anoxic brain injury or posterior reversible encephalopathy syndrome; no hemorrhage or midline shift. CT 7/5 showed no acute intracranial abnormality. ETT 7/4-7/10. Acute renal failure; CRRT stopped 7/12. PMH includes depression.   Clinical Impression   Unable to fully assess pt's prior level of functioning due to distractibility. Pt had visitor in room, stated she was pt's girlfriend, but pt was living with someone else. Visitor was unable to provide thorough history. According to pt's chart, PTA, pt was living with his girlfriend, and was independent with ADLs and IADLs. Pt currently required maxA+2 for functional mobility. Pt highly distractible and inappropriate at times, requiring modA for completion of ADLs. Due to decline in current level of function, pt would benefit from acute OT to address establish goals to facilitate safe D/C home. At this time, recommend CIR follow-up.      Follow Up Recommendations  CIR    Equipment Recommendations       Recommendations for Other Services       Precautions / Restrictions Precautions Precautions: Fall Restrictions Weight Bearing Restrictions: No      Mobility Bed Mobility Overal bed mobility: Needs Assistance Bed Mobility: Supine to Sit     Supine to sit: Min assist;+2 for safety/equipment Sit to supine: Max assist   General bed mobility comments: min assist with sequential cues to uncross legs, move legs to EOB and elevate trunk. Min-mod assist sitting for safety and balance pt impulsive and reaching for items  Transfers Overall transfer level: Needs assistance    Transfers: Sit to/from Stand Sit to Stand: Min assist;+2 safety/equipment         General transfer comment: min +2 to stand from bed x 3 trials with sequential cues and assist of belt and bil UE to stand. Pt inappropriate with attempting to kiss and grab therapist and needs redirection to task    Balance Overall balance assessment: Needs assistance Sitting-balance support: Feet supported;Bilateral upper extremity supported Sitting balance-Leahy Scale: Poor Sitting balance - Comments: 2x engaging core to prevent falling backwards, but overall maxA     Standing balance-Leahy Scale: Zero Standing balance comment: bil UE support with assist for balance, initial posterior right lean in standing                           ADL either performed or assessed with clinical judgement   ADL Overall ADL's : Needs assistance/impaired Eating/Feeding: Minimal assistance   Grooming: Minimal assistance;Cueing for sequencing   Upper Body Bathing: Cueing for sequencing;Moderate assistance   Lower Body Bathing: Cueing for sequencing;Moderate assistance   Upper Body Dressing : Cueing for sequencing;Moderate assistance   Lower Body Dressing: Moderate assistance;Cueing for sequencing Lower Body Dressing Details (indicate cue type and reason): pt able to sit EOB and lean over to adjust socks with modA for stability  Toilet Transfer: Moderate assistance;+2 for physical assistance;Ambulation;Cueing for sequencing   Toileting- Clothing Manipulation and Hygiene: Moderate assistance;+2 for physical assistance Toileting - Clothing Manipulation Details (indicate cue type and reason): pt required max VC for redirection to task to complete pericare in standing;pt required modA +2 for stability while standing during functional task  Functional mobility during ADLs: Moderate assistance;+2 for physical assistance;Cueing for sequencing General ADL Comments: pt highly distractable, required max  multimodal cues for redirection to task     Vision         Perception     Praxis      Pertinent Vitals/Pain Pain Assessment: No/denies pain     Hand Dominance     Extremity/Trunk Assessment Upper Extremity Assessment Upper Extremity Assessment: RUE deficits/detail;LUE deficits/detail RUE Deficits / Details: limited functional use;attempted to adjust socks, took multiple attempts to grasp sock and pull up;difficulty with shoulder flexion AROM, used compensatory strategies for shoulder flexion;poor control with eccentric shoulder movements;poor control of elbow, elbow flexion/extension strength 4/5 LUE Deficits / Details: limited functional use;attempted to adjust socks, took multiple attempts to grasp sock and pull up;difficulty with shoulder flexion AROM, used compensatory strategies for shoulder flexion;poor control with eccentric shoulder movements;poor control of elbow, elbow flexion/extension strength 4/5 LUE Coordination: decreased fine motor   Lower Extremity Assessment Lower Extremity Assessment: Defer to PT evaluation   Cervical / Trunk Assessment Cervical / Trunk Assessment: (poor trunk control )   Communication     Cognition Arousal/Alertness: Awake/alert Behavior During Therapy: Impulsive Overall Cognitive Status: Impaired/Different from baseline Area of Impairment: Orientation;Attention;Memory;Following commands;Safety/judgement;Problem solving                 Orientation Level: Disoriented to;Time;Situation;Place;Person Current Attention Level: Focused Memory: Decreased recall of precautions;Decreased short-term memory Following Commands: Follows one step commands inconsistently;Follows one step commands with increased time;Follows multi-step commands inconsistently Safety/Judgement: Decreased awareness of safety;Decreased awareness of deficits   Problem Solving: Slow processing;Decreased initiation;Difficulty sequencing;Requires verbal cues;Requires  tactile cues General Comments: pt required VC for redirection from innapropriate interactions with PT and OT;pt able to state name, not birtdate, no awareness to place or situation. Following single step commands with increased time and step by step sequencing. Highly distracted and benefits from only one command at a time. Inconsistently following 2 step commands to stand and wipe sacrum    General Comments  ex-girlfriend present during session;reports pt has not been living with her but has been living with someone else    Exercises     Shoulder Instructions      Home Living Family/patient expects to be discharged to:: Private residence Living Arrangements: Spouse/significant other(Girlfriend) Available Help at Discharge: Family;Friend(s);Available PRN/intermittently Type of Home: Apartment                              Lives With: Significant other    Prior Functioning/Environment Level of Independence: Independent                 OT Problem List: Decreased strength;Decreased range of motion;Decreased activity tolerance;Impaired balance (sitting and/or standing);Decreased coordination;Decreased cognition;Decreased safety awareness;Decreased knowledge of precautions;Decreased knowledge of use of DME or AE;Impaired tone;Impaired UE functional use      OT Treatment/Interventions: Self-care/ADL training;Therapeutic exercise;Neuromuscular education;DME and/or AE instruction;Therapeutic activities;Cognitive remediation/compensation;Patient/family education;Balance training    OT Goals(Current goals can be found in the care plan section) Acute Rehab OT Goals Patient Stated Goal: pt did not state a goal OT Goal Formulation: Patient unable to participate in goal setting Time For Goal Achievement: 07/25/18 Potential to Achieve Goals: Good  OT Frequency: Min 2X/week   Barriers to D/C:            Co-evaluation PT/OT/SLP Co-Evaluation/Treatment: Yes Reason for  Co-Treatment: Complexity of the patient's impairments (multi-system involvement);To address functional/ADL transfers  PT goals addressed during session: Balance;Mobility/safety with mobility OT goals addressed during session: ADL's and self-care      AM-PAC PT "6 Clicks" Daily Activity     Outcome Measure Help from another person eating meals?: A Little Help from another person taking care of personal grooming?: A Little Help from another person toileting, which includes using toliet, bedpan, or urinal?: A Lot Help from another person bathing (including washing, rinsing, drying)?: A Lot Help from another person to put on and taking off regular upper body clothing?: A Lot Help from another person to put on and taking off regular lower body clothing?: A Lot 6 Click Score: 14   End of Session Equipment Utilized During Treatment: Gait belt Nurse Communication: Mobility status;Other (comment)(use of restraints )  Activity Tolerance: Patient tolerated treatment well Patient left: in chair;with call bell/phone within reach;with family/visitor present;with restraints reapplied;with chair alarm set(nsg present while PT applied & educated pt on restraints)  OT Visit Diagnosis: Unsteadiness on feet (R26.81);Other abnormalities of gait and mobility (R26.89);Muscle weakness (generalized) (M62.81);Cognitive communication deficit (R41.841)                Time: [id number] OT Time Calculation (min): 28 min Charges:    G-Codes:     Diona Brownereresa Thompson OTS    Diona Brownereresa Thompson 07/11/2018, 11:42 AM

## 2018-07-11 NOTE — Progress Notes (Signed)
Subjective/Chief Complaint:  1 - Renal Failure - Cr 4's 06/2018 after MI / anoxic injury up from baseline of 1.2 2017. CT 06/2018 no hydro. Placed on CRRT intially and transitioned to IHD 7/14. Making minimal to no urine.   2 - Hematuria with Massive Prostate - new gross hematuria during admission for MI / anoxic injury 06/2018 after attempt IO cath. Known massive prostate at by CT calculation 2019.    Today "Christopher Dominguez" is stable. Still anuric. Had hemodialysis yesterday. Still doing better on haldol, family notes as well.    Objective: Vital signs in last 24 hours: Temp:  [98.6 F (37 C)-100.7 F (38.2 C)] 99.2 F (37.3 C) (07/15 2024) Pulse Rate:  [74-99] 81 (07/15 1554) Resp:  [14-81] 23 (07/15 1554) BP: (83-152)/(67-104) 141/82 (07/15 1554) SpO2:  [93 %-100 %] 97 % (07/15 1554) Weight:  [88.5 kg (195 lb 1.7 oz)] 88.5 kg (195 lb 1.7 oz) (07/15 0600) Last BM Date: 07/09/18  Intake/Output from previous day: 07/14 0701 - 07/15 0700 In: 1001.9 [P.O.:827; I.V.:174.9] Out: 0  Intake/Output this shift: No intake/output data recorded.   Physical Exam  Constitutional:  Minimal non-voluntary movement at present. Drowsy but arousable. Family at bedside.  HENT:  Rt neck catheter in place and not connected.   Cardiovascular: Normal rate.  Respiratory: Effort normal.  GI: Soft.  No scars. NO distnesion. NO SP TTP.   Genitourinary: Penis normal.  Genitourinary Comments: NO CVAT. No blood at meatus.   Skin: Skin is warm.    Lab Results:  Recent Labs    07/10/18 0240 07/11/18 0301  WBC 12.0* 10.7*  HGB 9.6* 8.8*  HCT 31.4* 28.2*  PLT 233 282   BMET Recent Labs    07/10/18 0240 07/11/18 0301  NA 142  142 143  K 4.2  4.3 4.2  CL 110  110 106  CO2 21*  20* 27  GLUCOSE 110*  108* 110*  BUN 78*  76* 55*  CREATININE 8.81*  8.82* 8.46*  CALCIUM 8.2*  8.4* 8.0*   PT/INR No results for input(s): LABPROT, INR in the last 72 hours. ABG No results for  input(s): PHART, HCO3 in the last 72 hours.  Invalid input(s): PCO2, PO2  Studies/Results: No results found.  Anti-infectives: Anti-infectives (From admission, onward)   Start     Dose/Rate Route Frequency Ordered Stop   07/06/18 1200  piperacillin-tazobactam (ZOSYN) IVPB 3.375 g    Note to Pharmacy:  Please stop after today   3.375 g 100 mL/hr over 30 Minutes Intravenous Every 6 hours 07/06/18 1015 07/06/18 1932   07/02/18 1800  piperacillin-tazobactam (ZOSYN) IVPB 3.375 g  Status:  Discontinued     3.375 g 12.5 mL/hr over 240 Minutes Intravenous Every 6 hours 07/02/18 1309 07/02/18 1310   07/02/18 1800  piperacillin-tazobactam (ZOSYN) IVPB 3.375 g  Status:  Discontinued     3.375 g 100 mL/hr over 30 Minutes Intravenous Every 6 hours 07/02/18 1311 07/06/18 1015   07/02/18 1200  piperacillin-tazobactam (ZOSYN) IVPB 2.25 g  Status:  Discontinued     2.25 g 100 mL/hr over 30 Minutes Intravenous Every 6 hours 07/02/18 0728 07/02/18 1309   07/01/18 0100  piperacillin-tazobactam (ZOSYN) IVPB 3.375 g  Status:  Discontinued     3.375 g 12.5 mL/hr over 240 Minutes Intravenous Every 8 hours 06/30/18 1812 07/02/18 0728   06/30/18 1815  piperacillin-tazobactam (ZOSYN) IVPB 3.375 g     3.375 g 100 mL/hr over 30 Minutes Intravenous  Once  06/30/18 1804 06/30/18 1945      Assessment/Plan:   1 - Renal Failure - likely due to severe ischemic injury. NO sig hydro to suggests substantial obstructive component. Agree with dialysis for with goal of IHD to minimize heparin exposure in setting of hematuria. His near anuria does not bode well.   2 - Hematuria with Massive Prostate - CT w/o upper tract lesions. This is likely from prior foley trauma in setting of massive prostate and heparin. Continue to recommend STRONGLY AGAINST any catheters (even I/O) as this will only exacerbate problem unless frank retention (true post-void residuals of >53600mL). 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     [facility] LLCMANNY, Teyona Nichelson 07/11/2018

## 2018-07-11 NOTE — Plan of Care (Signed)
  Problem: Clinical Measurements: Goal: Ability to maintain clinical measurements within normal limits will improve Outcome: Progressing Goal: Will remain free from infection Outcome: Progressing Goal: Diagnostic test results will improve Outcome: Progressing Goal: Respiratory complications will improve Outcome: Progressing Goal: Cardiovascular complication will be avoided Outcome: Progressing   Problem: Activity: Goal: Risk for activity intolerance will decrease Outcome: Progressing   Problem: Nutrition: Goal: Adequate nutrition will be maintained Outcome: Progressing   Problem: Coping: Goal: Level of anxiety will decrease Outcome: Progressing   Problem: Elimination: Goal: Will not experience complications related to bowel motility Outcome: Progressing Goal: Will not experience complications related to urinary retention Outcome: Progressing   Problem: Pain Managment: Goal: General experience of comfort will improve Outcome: Progressing   Problem: Safety: Goal: Ability to remain free from injury will improve Outcome: Progressing   Problem: Skin Integrity: Goal: Risk for impaired skin integrity will decrease Outcome: Progressing   Problem: Cardiac: Goal: Ability to achieve and maintain adequate cardiopulmonary perfusion will improve Outcome: Progressing   Problem: Neurologic: Goal: Promote progressive neurologic recovery Outcome: Progressing   Problem: Skin Integrity: Goal: Risk for impaired skin integrity will be minimized. Outcome: Progressing   Problem: Education: Goal: Knowledge of General Education information will improve Outcome: Not Progressing   Problem: Health Behavior/Discharge Planning: Goal: Ability to manage health-related needs will improve Outcome: Not Progressing   Problem: Education: Goal: Ability to manage disease process will improve Outcome: Not Progressing  Patient with neurological deficits post cardiac arrest. Patient only  oriented to self. No signs of learning noted at this time. Patient unable to self manage health related needs at this time. Requires full assistance with ADLs and care management. At this time he is unable to self manage his disease process that has lead to hospitalization. Family at bedside remain supportive and assists patient with care such as feeding.

## 2018-07-11 NOTE — Progress Notes (Signed)
  Speech Language Pathology Treatment: Dysphagia  Patient Details Name: Christopher Dominguez MRN: [id number] DOB: 28-Aug-1961 Today's Date: 07/11/2018 Time: 1300-1320 SLP Time Calculation (min) (ACUTE ONLY): 20 min  Assessment / Plan / Recommendation Clinical Impression  Skilled treatment session focused on dysphagia goals. Girlfriend and sister present and pt's dentures were in his mouth. SLP facilitated session by providing skilled observation of pt consuming graham cracker. Pt with good oropharyngeal function despite continuously moving in attempts to hit his sister on her head and interacting inappropriately with SLP. Despite Max A cues, pt was difficult to redirect, therefore recommend initial upgrade to dysphagia 2 until further observation of advanced textures can be performed. [name] nurse present as pt's behaviors became more inappropriate. Education provided to nursing on supervision during meals. Pt immediately began interacting more appropriately with his nurse present.    HPI HPI: 57 y/o male had a cardiac arrest out of hospital and received 7 rounds of epinephrine to regain a pulse. Tox positive for cocaine. Intubated from 7/4-7/10.       SLP Plan  Continue with current plan of care       Recommendations  Diet recommendations: Dysphagia 2 (fine chop);Thin liquid Liquids provided via: Cup;Straw Medication Administration: Whole meds with puree Supervision: Patient able to self feed;Staff to assist with self feeding;Full supervision/cueing for compensatory strategies Compensations: Slow rate;Small sips/bites;Minimize environmental distractions Postural Changes and/or Swallow Maneuvers: Seated upright 90 degrees                General recommendations: Rehab consult Oral Care Recommendations: Oral care BID Follow up Recommendations: Inpatient Rehab SLP Visit Diagnosis: Dysphagia, unspecified (R13.10) Plan: Continue with current plan of care       GO                 Christopher Dominguez 07/11/2018, 1:38 PM

## 2018-07-11 NOTE — Progress Notes (Signed)
Physical Therapy Treatment Patient Details Name: Christopher Dominguez MRN: [id number] DOB: 1961-01-23 Today's Date: 07/11/2018    History of Present Illness Pt is a 57 y.o. 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 CT 7/4 showed bilateral occipital edema and R frontoparietal edema, question anoxic brain injury or posterior reversible encephalopathy syndrome; no hemorrhage or midline shift. CT 7/5 showed no acute intracranial abnormality. ETT 7/4-7/10. Acute renal failure; CRRT stopped 7/12. PMH includes depression.    PT Comments    Pt alert, distracted, and inappropriate at times. Pt able to follow single step commands with increased time and direction. Pt able to progress mobility to include gait today and initiate self care activities. Pt limited by cognitive and balance deficits but remains appropriate for acute and CIR therapies. Sister present end of session states son plans for pt to ultimately return to [place] at D/C.   HR 73-79 BP 83/67 SpO2 93% on RA   Follow Up Recommendations  CIR;Supervision/Assistance - 24 hour     Equipment Recommendations  Other (comment)(TBD)    Recommendations for Other Services       Precautions / Restrictions Precautions Precautions: Fall Restrictions Weight Bearing Restrictions: No    Mobility  Bed Mobility Overal bed mobility: Needs Assistance Bed Mobility: Supine to Sit     Supine to sit: Min assist;+2 for safety/equipment     General bed mobility comments: min assist with sequential cues to uncross legs, move legs to EOB and elevate trunk. Min-mod assist sitting for safety and balance pt impulsive and reaching for items  Transfers Overall transfer level: Needs assistance   Transfers: Sit to/from Stand Sit to Stand: Min assist;+2 safety/equipment         General transfer comment: min +2 to stand from bed x 3 trials with sequential cues and assist of belt and  bil UE to stand. Pt inappropriate with attempting to kiss and grab therapist and needs redirection to task  Ambulation/Gait Ambulation/Gait assistance: Max assist;+2 physical assistance;+2 safety/equipment Gait Distance (Feet): 100 Feet Assistive device: 2 person hand held assist Gait Pattern/deviations: Scissoring;Drifts right/left;Narrow base of support   Gait velocity interpretation: 1.31 - 2.62 ft/sec, indicative of limited community ambulator General Gait Details: Pt with narrow BOS and scissored gait throughout with periods of right knee buckling. Bil UE support for gait with 3rd person to manage lines. Physical assist for balance, stability and anterior translation. Turning to left on first trial pt able to take steps and change direction. On second trial pt planting right foot posterior, with posterior lean and lifting left leg with chair needed to be pulled to him.    Stairs             Wheelchair Mobility    Modified Rankin (Stroke Patients Only)       Balance Overall balance assessment: Needs assistance Sitting-balance support: Feet supported;Bilateral upper extremity supported Sitting balance-Leahy Scale: Poor       Standing balance-Leahy Scale: Zero Standing balance comment: bil UE support with assist for balance, initial posterior right lean in standing                            Cognition Arousal/Alertness: Awake/alert Behavior During Therapy: Impulsive Overall Cognitive Status: Impaired/Different from baseline Area of Impairment: Orientation;Attention;Memory;Following commands;Safety/judgement;Problem solving                 Orientation Level: Disoriented to;Time;Situation;Place;Person Current Attention  Level: Focused Memory: Decreased recall of precautions;Decreased short-term memory Following Commands: Follows one step commands inconsistently;Follows one step commands with increased time;Follows multi-step commands  inconsistently Safety/Judgement: Decreased awareness of safety;Decreased awareness of deficits   Problem Solving: Slow processing;Decreased initiation;Difficulty sequencing;Requires verbal cues;Requires tactile cues General Comments: pt able to state name, not birtdate, no awareness to place or situation. Following single step commands with increased time and step by step sequencing. Highly distracted and benefits from only one command at a time. Inconsistently following 2 step commands to stand and wipe sacrum       Exercises      General Comments        Pertinent Vitals/Pain Pain Assessment: No/denies pain    Home Living                      Prior Function            PT Goals (current goals can now be found in the care plan section) Progress towards PT goals: Progressing toward goals    Frequency    Min 3X/week      PT Plan Current plan remains appropriate    Co-evaluation PT/OT/SLP Co-Evaluation/Treatment: Yes Reason for Co-Treatment: Complexity of the patient's impairments (multi-system involvement);For patient/therapist safety PT goals addressed during session: Balance;Mobility/safety with mobility        AM-PAC PT "6 Clicks" Daily Activity  Outcome Measure  Difficulty turning over in bed (including adjusting bedclothes, sheets and blankets)?: A Little Difficulty moving from lying on back to sitting on the side of the bed? : A Lot Difficulty sitting down on and standing up from a chair with arms (e.g., wheelchair, bedside commode, etc,.)?: Unable Help needed moving to and from a bed to chair (including a wheelchair)?: A Lot Help needed walking in hospital room?: A Lot Help needed climbing 3-5 steps with a railing? : Total 6 Click Score: 11    End of Session Equipment Utilized During Treatment: Gait belt Activity Tolerance: Patient tolerated treatment well Patient left: in chair;with call bell/phone within reach;with chair alarm set;Other  (comment)(Chair alarm belt in place with visitor and RN present and pt educated for its use) Nurse Communication: Mobility status PT Visit Diagnosis: Other abnormalities of gait and mobility (R26.89);Other symptoms and signs involving the nervous system (R29.898);Unsteadiness on feet (R26.81)     Time: [id number] PT Time Calculation (min) (ACUTE ONLY): 29 min  Charges:  $Gait Training: 8-22 mins                    G Codes:       Christopher Dominguez, PT [phone]    Christopher Dominguez 07/11/2018, 9:18 AM

## 2018-07-11 NOTE — Plan of Care (Signed)
57 yo male admitted with polysubstance abuse cardiac arrest,cmp,new onset acute renal failure requiring dialysis.followed by cardio,renal,gi uro and neuro.TRH pick up 7/16.

## 2018-07-11 NOTE — Progress Notes (Signed)
Inpatient Rehabilitation Admissions Coordinator  Patient is making progress with therapies. I recommend an inpt rehab consult at this time. Even with any rehab venue, he will still require 24/7 assist at d/c. Please place order for consult.  Ottie GlazierBarbara Hayley Horn, RN, MSN Rehab Admissions Coordinator [phone] 07/11/2018 5:50 PM

## 2018-07-11 NOTE — Progress Notes (Addendum)
PULMONARY / CRITICAL CARE MEDICINE   Name: Georga KaufmannShelton M Himmelberger MRN: [id number] DOB: 06-Aug-1961    ADMISSION DATE:  06/30/2018  CHIEF COMPLAINT:  Cardiac arrest  HISTORY OF PRESENT ILLNESS:   57 y/o male had a cardiac arrest out of hospital and received 7 rounds of epinephrine to regain a pulse.    SUBJECTIVE: RN reports pt has been off precedex >24 hours. No acute issues.  Pt up to chair, has walked this am around the unit.  Afebrile.    VITAL SIGNS: BP (!) 83/67   Pulse 80   Temp 99 F (37.2 C) (Oral)   Resp 15   Ht 5\' 11"  (1.803 m)   Wt 195 lb 1.7 oz (88.5 kg)   SpO2 94%   BMI 27.21 kg/m   HEMODYNAMICS:    VENTILATOR SETTINGS:    INTAKE / OUTPUT: I/O last 3 completed shifts: In: 1357.6 [P.O.:827; I.V.:530.6] Out: 0   PHYSICAL EXAMINATION: General: adult male in NAD, sitting in chair   HEENT: MM pink/moist, R IJ HD catheter  Neuro: Awake, alert.  Oriented to self, time.  Unable to state place but [facility].  MAE CV: s1s2 rrr, no m/r/g PULM: even/non-labored, lungs bilaterally clear  GI: soft, non-tender, bsx4 active, tolerating PO's  Extremities: warm/dry, no edema  Skin: no rashes or lesions  LABS:  BMET Recent Labs  Lab 07/09/18 0342 07/10/18 0240 07/11/18 0301  NA 139 142  142 143  K 4.2 4.2  4.3 4.2  CL 105 110  110 106  CO2 24 21*  20* 27  BUN 44* 78*  76* 55*  CREATININE 5.67* 8.81*  8.82* 8.46*  GLUCOSE 123* 110*  108* 110*    Electrolytes Recent Labs  Lab 07/09/18 0342 07/10/18 0240 07/11/18 0301  CALCIUM 8.1* 8.2*  8.4* 8.0*  MG 3.1* 3.5* 2.6*  PHOS 5.8* 5.9* 4.5    CBC Recent Labs  Lab 07/09/18 0342 07/10/18 0240 07/11/18 0301  WBC 11.5* 12.0* 10.7*  HGB 9.2* 9.6* 8.8*  HCT 30.2* 31.4* 28.2*  PLT 192 233 282    Coag's Recent Labs  Lab 07/09/18 0342 07/10/18 0240 07/11/18 0301  APTT 36 36 34    Sepsis Markers No results for input(s): LATICACIDVEN, PROCALCITON, O2SATVEN in the  last 168 hours.  ABG Recent Labs  Lab 07/06/18 1055  PHART 7.423  PCO2ART 36.7  PO2ART 155.0*    Liver Enzymes Recent Labs  Lab 07/07/18 0351  07/09/18 0342 07/10/18 0240 07/11/18 0301  AST 40  --   --   --   --   ALT 333*  --   --   --   --   ALKPHOS 81  --   --   --   --   BILITOT 1.0  --   --   --   --   ALBUMIN 2.6*  2.6*   < > 2.3* 2.5* 2.3*   < > = values in this interval not displayed.    Cardiac Enzymes No results for input(s): TROPONINI, PROBNP in the last 168 hours.  Glucose Recent Labs  Lab 07/10/18 0837 07/10/18 1156 07/10/18 1545 07/10/18 1943 07/10/18 2358 07/11/18 0757  GLUCAP 95 121* 98 105* 116* 98    Imaging No results found.   STUDIES:  CT Head 7/4 >> worrisome for anoxic injury EEG 7/5 >> diffuse cerebral disfunction that is non-specific TTE 7/5 >> LVEF 25-30%, mild LVD, enlarged RV with severe RV dysfuncion CT  head 7/5 >> no acute intracranial abnormality, prior CT findings may have been artifact  ANTIBIOTICS: Zosyn 7/4 >> 7/10  SIGNIFICANT EVENTS: 7/04 Admit with cardiac arrest 7/06 Spontaneous movements, pain response, CVVHD 7/07 Early AM myoclonus 7/10 Extubated   LINES/TUBES: ETT 7/4>>> 7/10 L IJ TLC 7/4 >> R radial a-line 7/4 >> out R IJ HD cath >>   Cultures: Blood 7/4 >>  C. Diff 7/4 >> neg  DISCUSSION: 57 y/o male with minimal past medical history admitted after cardiac arrest. Hospital course complicated by AKI requiring HD, chorea post extubation.    ASSESSMENT / PLAN: Resolved Issues Shock Vomiting (7/8) Cardiogenic shock   Active issues  A: Acute respiratory failure with hypoxemia - extubated 7/10  Aspiration pneumonia P: Completed abx Pulmonary hygiene - IS, mobilize  Follow intermittent CXR   A: Cardiac arrest w/ presumed toxic Cardiomyopathy d/t cocaine  HTN -EF has improved from 25-30% to 55-60% w/out current evidence of WM abnormality Trop elevation felt to be d/t CPR and not  ACS P: Transfer to Telemetry  Drug abuse cessation counseling  Will likely need ischemia evaluation before going home > defer to Cardiology   A: Hematuria Enlarged Prostate - Avoid foley per Urology given very large prostate unless residual >500 P: Appreciate Urology input Monitor UOP Resume Flomax  A: Rectal Bleeding  P: GI following  Hold SQ heparin  SCD's for DVT prophylaxis  Trend CBC   A: Acute Renal Failure - CRRT stopped 7/12 due to hematuria, rectal bleeding  P: Trend BMP / urinary output Replace electrolytes as indicated Avoid nephrotoxic agents, ensure adequate renal perfusion Appreciate Nephrology  Avoid NSAIDS (used at home)  A: Shock liver acute transaminitis - trending down  P: Trend LFT's Supportive care  Continue D1 diet  Nepro dietary supplement BID   A: Auspected anoxic brain injury Polysubstance Abuse Chorea P: Supportive care  PT efforts  Appreciate Neurology input > rec's for no long term haldol, consider gabapentin if chorea does not resolve Follow serial neuro exams   FAMILY  - Updates:  Sister, niece and friend updated at bedside on NP rounds.   - Global: Transition out of ICU to Tele, TRH as of 7/16 am.   Canary Brim, NP-C [facility] Pgr: [phone] or if no answer [phone] 07/11/2018, 9:18 AM

## 2018-07-12 LAB — GLUCOSE, CAPILLARY
GLUCOSE-CAPILLARY: 102 mg/dL — AB (ref 70–99)
GLUCOSE-CAPILLARY: 90 mg/dL (ref 70–99)
GLUCOSE-CAPILLARY: 135 mg/dL — AB (ref 70–99)
Glucose-Capillary: 120 mg/dL — ABNORMAL HIGH (ref 70–99)
Glucose-Capillary: 110 mg/dL — ABNORMAL HIGH (ref 70–99)

## 2018-07-12 LAB — MAGNESIUM: MAGNESIUM: 2.9 mg/dL — AB (ref 1.7–2.4)

## 2018-07-12 LAB — RENAL FUNCTION PANEL
ANION GAP: 12 (ref 5–15)
Albumin: 2.4 g/dL — ABNORMAL LOW (ref 3.5–5.0)
BUN: 81 mg/dL — ABNORMAL HIGH (ref 6–20)
CO2: 24 mmol/L (ref 22–32)
Calcium: 8.3 mg/dL — ABNORMAL LOW (ref 8.9–10.3)
Chloride: 106 mmol/L (ref 98–111)
Creatinine, Ser: 12.58 mg/dL — ABNORMAL HIGH (ref 0.61–1.24)
GFR, EST AFRICAN AMERICAN: 4 mL/min — AB (ref >60)
GFR, EST NON AFRICAN AMERICAN: 4 mL/min — AB (ref >60)
Glucose, Bld: 103 mg/dL — ABNORMAL HIGH (ref 70–99)
Phosphorus: 6.8 mg/dL — ABNORMAL HIGH (ref 2.5–4.6)
Potassium: 4.9 mmol/L (ref 3.5–5.1)
Sodium: 142 mmol/L (ref 135–145)

## 2018-07-12 LAB — HEPATITIS B E ANTIBODY: Hep B E Ab: NEGATIVE

## 2018-07-12 MED ORDER — CHLORHEXIDINE GLUCONATE CLOTH 2 % EX PADS
6.0000 | MEDICATED_PAD | Freq: Every day | CUTANEOUS | Status: DC
  Administered 2018-07-13 – 2018-07-19 (×4): 6 via TOPICAL

## 2018-07-12 MED ORDER — HEPARIN SODIUM (PORCINE) 5000 UNIT/ML IJ SOLN
5000.0000 [IU] | Freq: Three times a day (TID) | INTRAMUSCULAR | Status: DC
  Administered 2018-07-12 – 2018-08-01 (×57): 5000 [IU] via SUBCUTANEOUS
  Filled 2018-07-12 (×59): qty 1

## 2018-07-12 NOTE — Care Management Note (Signed)
Case Management Note Donn PieriniKristi Paytience Bures RN,BSN Unit [facility] 1[facility]22 Case Manager  [phone]  Patient Details  Name: Christopher Dominguez MRN: [id number] Date of Birth: Jan 03, 1961  Subjective/Objective:  Pt admitted s/p cardiac arrest (hx of polysubstance abuse) with AKI secondary to arrest s/p CRRT and IHD with temp IJ cath                   Action/Plan: PTA pt lived at home, recommendation for CIR consult. MD please place order for CIR to eval for possible admission. CM to follow for transition of care needs  Expected Discharge Date:  07/15/18               Expected Discharge Plan:     In-House Referral:  Clinical Social Work  Discharge planning Services  CM Consult  Post Acute Care Choice:    Choice offered to:     DME Arranged:    DME Agency:     HH Arranged:    HH Agency:     Status of Service:  In process, will continue to follow  If discussed at Long Length of Stay Meetings, dates discussed:    Discharge Disposition:   Additional Comments:  Darrold SpanWebster, Shantanique Hodo Hall, RN 07/12/2018, 10:49 AM

## 2018-07-12 NOTE — Clinical Social Work Note (Signed)
CSW acknowledges consult for competency/guardianship. Patient only oriented to self but based on documentation, it appears patient has supportive family members that can make decisions. There is a son listed in emergency contacts. Please consult psych if competency evaluation needed.  Charlynn CourtSarah Icholas Irby, CSW [phone]

## 2018-07-12 NOTE — Progress Notes (Signed)
Nutrition Follow-up  DOCUMENTATION CODES:   Not applicable  INTERVENTION:   Nepro Shake po BID, each supplement provides 425 kcal and 19 grams protein  Double protein at meals  NUTRITION DIAGNOSIS:   Inadequate oral intake related to lethargy/confusion as evidenced by meal completion < 25%. Improved  GOAL:   Patient will meet greater than or equal to 90% of their needs Met.   MONITOR:   Supplement acceptance, Diet advancement, PO intake, I & O's  ASSESSMENT:   Pt with unknown PMH admitted after OOH arrest.   Spoke with pt and family. Family feeding patient since he has on mittens from pulling out lines overnight. They report good appetite and eating most food served. He now has his dentures and is eating well. He did not remember trying the Nepro but one was at bedside.  Explained why pt was ordered Nepro, he is still not on a renal diet but we discussed if this would be needed long term we would discuss this diet prior to discharge.  Diet advanced to Dysphagia 2 with Thin liquids  7/6 - 7/12 CVVHD 7/8 vomiting, TF decreased to 20 ml/hr 7/10 extubated 7/14 iHD started, per renal no sign of renal recovery   Labs reviewed: PO4: 6.8, magnesium 2.9 (H), K+ 4.9 WNL  Diet Order:   Diet Order           DIET DYS 2 Room service appropriate? Yes; Fluid consistency: Thin  Diet effective now          EDUCATION NEEDS:   No education needs have been identified at this time  Skin:  Skin Assessment: Reviewed RN Assessment  Last BM:  7/15 x 3  Height:   Ht Readings from Last 1 Encounters:  07/05/18 _0  (1.803 m)    Weight:   Wt Readings from Last 1 Encounters:  07/12/18 197 lb 15.6 oz (89.8 kg)    Ideal Body Weight:  78.1 kg  BMI:  Body mass index is 27.61 kg/m.  Estimated Nutritional Needs:   Kcal:  2200-2400  Protein:  105-125 grams  Fluid:  2 L/day  Maylon Peppers RD, LDN, CNSC [phone] Pager [phone] After Hours Pager'

## 2018-07-12 NOTE — Progress Notes (Addendum)
PROGRESS NOTE    Christopher KaufmannShelton M Dominguez  ZOX:[id number] DOB: [birth year] DOA: 06/30/2018 PCP: Patient, No Pcp Per  Brief Narrative:with no significant past medical history, [place] to critical care on 7/4 following a cardiac arrest. -Hospitalization complicated by acute kidney injury requiring hemodialysis, chorea post extubation, encephalopathy from anoxic brain injury, cardiomyopathy with EF down to 25% which recovered to 55% range in 3 days on repeat echo. -Transferred from PCCM to hospitalist service today 7/16  Assessment & Plan:    s/p Cardiac arrest [facility](HCC) -presumed to be due to toxic cardiomyopathy due to cocaine -EF was 25-30% on admission which improved to 55-60% without evidence of any current ongoing motion abnormality -Significantly elevated troponin on admission felt to be due to CPR and cardiac arrest and not ACS -Seen by cardiology this admission -Plan for ischemia evaluation once stable, too confused at this time     Acute respiratory failure with hypoxemia (HCC) -following cardiac arrest -Extubated 7/10 -Also treated for aspiration pneumonia, completed antibiotics -Continue pulmonary toilet, incentive spirometer, nebs as needed   Encephalopathy/anoxic brain injury -seen by neurology, noted to have waxing and waning mental status -Also had post hypoxic chorea -Haldol when necessary, gabapentin could be tried as an alternative -Will need long-term rehabilitation and outpatient neurology follow-up -SLP following on dysphagia 2 diet  Acute renal failure -Due to cardiac arrest, shock -Renal following, was getting CRRT -intermittent HD now, has rapid IJ HD catheter -Last dialysis yesterday -no signs of renal recovery yet  Hematuria/enlarged prostate -Seen by urology -Given very large prostate recommended to avoid Foley unless residuals greater than 500 -continue Flomax   Transient rectal bleeding -Resolved, seen by GI, supportive care for  now -Subcutaneous heparin held, continue SCDs for DVT prophylaxis -Hemoglobin stable, resume heparin in a few days if no further bleeding recurrence   shock liver -Transaminitis improving -Trend LFTs   DVT prophylaxis: scdS Code Status: Full Code Family Communication:no family at bedside Disposition Plan: transfer to telemetry, will need long-term rehabilitation  Consultants:   Renal  Cardiology  GI  Urology   Procedures:   right IJ hemodialysis catheter  CRRT followed by intermittent hemodialysis  Antimicrobials:    Subjective: -no complaints, states irritated and agitated from time to time, pleasantly confused  Objective: Vitals:   07/12/18 0500 07/12/18 0600 07/12/18 0722 07/12/18 1154  BP: 140/84 (!) 136/91  (!) 144/91  Pulse:      Resp: (!) 22 14    Temp:   98.5 F (36.9 C) 98.4 F (36.9 C)  TempSrc:   Oral Oral  SpO2:      Weight:  89.8 kg (197 lb 15.6 oz)    Height:        Intake/Output Summary (Last 24 hours) at 07/12/2018 1218 Last data filed at 07/11/2018 2000 Gross per 24 hour  Intake 540 ml  Output -  Net 540 ml   Filed Weights   07/10/18 1728 07/11/18 0600 07/12/18 0600  Weight: 87.9 kg (193 lb 12.6 oz) 88.5 kg (195 lb 1.7 oz) 89.8 kg (197 lb 15.6 oz)    Examination:  General exam: awake, alert,  oriented to self only, confused Respiratory system: Clear to auscultation. Respiratory effort normal. Cardiovascular system: S1 & S2 heard, RRR Gastrointestinal system: Abdomen is nondistended, soft and nontender.Normal bowel sounds heard. Central nervous system: awake, confused, moves all extremities, no localizing signs. Extremities: no edema Skin: No rashes Psychiatry:confused, unable to assess    Data Reviewed:   CBC: Recent  Labs  Lab 07/08/18 1541 07/08/18 2146 07/09/18 0342 07/10/18 0240 07/11/18 0301  WBC 14.6* 12.5* 11.5* 12.0* 10.7*  NEUTROABS 9.7* 8.3* 7.1 8.3* 7.9*  HGB 10.8* 9.6* 9.2* 9.6* 8.8*  HCT 35.1* 30.8*  30.2* 31.4* 28.2*  MCV 71.2* 71.1* 71.6* 71.7* 70.1*  PLT 197 186 192 233 282   Basic Metabolic Panel: Recent Labs  Lab 07/08/18 0357 07/08/18 1541 07/09/18 0342 07/10/18 0240 07/11/18 0301 07/12/18 0732  NA 139 140 139 142  142 143 142  K 4.3 4.4 4.2 4.2  4.3 4.2 4.9  CL 102 103 105 110  110 106 106  CO2 25 25 24  21*  20* 27 24  GLUCOSE 92 102* 123* 110*  108* 110* 103*  BUN 19 28* 44* 78*  76* 55* 81*  CREATININE 2.86* 2.94* 5.67* 8.81*  8.82* 8.46* 12.58*  CALCIUM 8.3* 8.4* 8.1* 8.2*  8.4* 8.0* 8.3*  MG 2.8*  --  3.1* 3.5* 2.6* 2.9*  PHOS 2.5 2.5 5.8* 5.9* 4.5 6.8*   GFR: Estimated Creatinine Clearance: 6.8 mL/min (A) (by C-G formula based on SCr of 12.58 mg/dL (H)). Liver Function Tests: Recent Labs  Lab 07/07/18 0351  07/08/18 1541 07/09/18 0342 07/10/18 0240 07/11/18 0301 07/12/18 0732  AST 40  --   --   --   --   --   --   ALT 333*  --   --   --   --   --   --   ALKPHOS 81  --   --   --   --   --   --   BILITOT 1.0  --   --   --   --   --   --   PROT 6.2*  --   --   --   --   --   --   ALBUMIN 2.6*  2.6*   < > 2.6* 2.3* 2.5* 2.3* 2.4*   < > = values in this interval not displayed.   No results for input(s): LIPASE, AMYLASE in the last 168 hours. No results for input(s): AMMONIA in the last 168 hours. Coagulation Profile: No results for input(s): INR, PROTIME in the last 168 hours. Cardiac Enzymes: No results for input(s): CKTOTAL, CKMB, CKMBINDEX, TROPONINI in the last 168 hours. BNP (last 3 results) No results for input(s): PROBNP in the last 8760 hours. HbA1C: No results for input(s): HGBA1C in the last 72 hours. CBG: Recent Labs  Lab 07/11/18 2022 07/11/18 2343 07/12/18 0433 07/12/18 0726 07/12/18 1156  GLUCAP 109* 110* 102* 90 120*   Lipid Profile: No results for input(s): CHOL, HDL, LDLCALC, TRIG, CHOLHDL, LDLDIRECT in the last 72 hours. Thyroid Function Tests: No results for input(s): TSH, T4TOTAL, FREET4, T3FREE, THYROIDAB in  the last 72 hours. Anemia Panel: No results for input(s): VITAMINB12, FOLATE, FERRITIN, TIBC, IRON, RETICCTPCT in the last 72 hours. Urine analysis:    Component Value Date/Time   COLORURINE YELLOW 06/30/2018 1411   APPEARANCEUR CLOUDY (A) 06/30/2018 1411   LABSPEC 1.026 06/30/2018 1411   PHURINE 5.0 06/30/2018 1411   GLUCOSEU 50 (A) 06/30/2018 1411   HGBUR NEGATIVE 06/30/2018 1411   BILIRUBINUR NEGATIVE 06/30/2018 1411   KETONESUR 5 (A) 06/30/2018 1411   PROTEINUR 100 (A) 06/30/2018 1411   NITRITE NEGATIVE 06/30/2018 1411   LEUKOCYTESUR NEGATIVE 06/30/2018 1411   Sepsis Labs: @LABRCNTIP (procalcitonin:4,lacticidven:4)  )No results found for this or any previous visit (from the past 240 hour(s)).  Radiology Studies: No results found.      Scheduled Meds: . chlorhexidine  15 mL Mouth Rinse BID  . Chlorhexidine Gluconate Cloth  6 each Topical Q0600  . Chlorhexidine Gluconate Cloth  6 each Topical Q0600  . feeding supplement (NEPRO CARB STEADY)  237 mL Oral BID BM  . heparin injection (subcutaneous)  5,000 Units Subcutaneous Q8H  . pantoprazole  40 mg Oral Q1200  . tamsulosin  0.4 mg Oral Daily   Continuous Infusions: . sodium chloride 10 mL/hr at 07/07/18 0100     LOS: 12 days    Time spent:    Zannie Cove, MD [facility] Page via [email or web address], password [email or web address] After 7PM please contact night-coverage  07/12/2018, 12:18 PM

## 2018-07-12 NOTE — Progress Notes (Signed)
  Speech Language Pathology Treatment: Dysphagia  Patient Details Name: Christopher Dominguez MRN: [id number] DOB: 1961-04-25 Today's Date: 07/12/2018 Time: [id number] SLP Time Calculation (min) (ACUTE ONLY): 15 min  Assessment / Plan / Recommendation Clinical Impression  Skilled treatment session focused on dysphagia. SLP received pt in bed with bilateral mitts in place and family member present. Pt appeared irritated and cussing. SLP attempted redirections and pt able to consume minimal amount of graham cracker and thin water via straw. No overt s/s of aspiration or oral residue observed. Family member reports good consumption of breakfast with no coughing. Given, pt's mentation would not recommend further upgrade today. Will continue more advanced trials as mentation improves. Family member in agreement. Continue dysphagia 2 with thin liquids.    HPI HPI: 57 y/o male had a cardiac arrest out of hospital and received 7 rounds of epinephrine to regain a pulse. Tox positive for cocaine. Intubated from 7/4-7/10.       SLP Plan  Continue with current plan of care       Recommendations  Diet recommendations: Dysphagia 2 (fine chop);Thin liquid Liquids provided via: Cup;Straw Medication Administration: Whole meds with puree Supervision: Patient able to self feed;Staff to assist with self feeding;Full supervision/cueing for compensatory strategies Compensations: Slow rate;Small sips/bites;Minimize environmental distractions Postural Changes and/or Swallow Maneuvers: Seated upright 90 degrees                General recommendations: Rehab consult Oral Care Recommendations: Oral care BID Follow up Recommendations: Inpatient Rehab SLP Visit Diagnosis: Dysphagia, unspecified (R13.10) Plan: Continue with current plan of care       GO                Licet Dunphy 07/12/2018, 11:58 AM

## 2018-07-12 NOTE — Progress Notes (Signed)
Assessment/ Plan:  57 y/o man h/o cardiac arrest   1 AKI secondary to arrest. S/p CRRT and IHD, temp IJ cath >>>HD today 2 HTN- controlled 3 Anemia stable 4 s/p Arrest 5 Substance abuse 6 Enceph per neuro  Subjective: Interval History: sleepy this AM  Objective: Vital signs in last 24 hours: Temp:  [97.9 F (36.6 C)-99.5 F (37.5 C)] 98.5 F (36.9 C) (07/16 0722) Pulse Rate:  [77-99] 81 (07/15 1554) Resp:  [14-42] 14 (07/16 0600) BP: (132-146)/(78-94) 136/91 (07/16 0600) SpO2:  [93 %-100 %] 100 % (07/16 0000) Weight:  [89.8 kg (197 lb 15.6 oz)] 89.8 kg (197 lb 15.6 oz) (07/16 0600) Weight change: 1.9 kg (4 lb 3 oz)  Intake/Output from previous day: 07/15 0701 - 07/16 0700 In: 1017 [P.O.:780; NG/GT:237] Out: -  Intake/Output this shift: No intake/output data recorded.  General appearance: agitated, sleepy Cardio: regular rate and rhythm, S1, S2 normal, no murmur, click, rub or gallop Extremities: extremities normal, atraumatic, no cyanosis or edema  Lab Results: Recent Labs    07/10/18 0240 07/11/18 0301  WBC 12.0* 10.7*  HGB 9.6* 8.8*  HCT 31.4* 28.2*  PLT 233 282   BMET:  Recent Labs    07/11/18 0301 07/12/18 0732  NA 143 142  K 4.2 4.9  CL 106 106  CO2 27 24  GLUCOSE 110* 103*  BUN 55* 81*  CREATININE 8.46* 12.58*  CALCIUM 8.0* 8.3*   No results for input(s): PTH in the last 72 hours. Iron Studies: No results for input(s): IRON, TIBC, TRANSFERRIN, FERRITIN in the last 72 hours. Studies/Results: No results found.  Scheduled: . chlorhexidine  15 mL Mouth Rinse BID  . Chlorhexidine Gluconate Cloth  6 each Topical Q0600  . feeding supplement (NEPRO CARB STEADY)  237 mL Oral BID BM  . heparin injection (subcutaneous)  5,000 Units Subcutaneous Q8H  . pantoprazole  40 mg Oral Q1200  . tamsulosin  0.4 mg Oral Daily    LOS: 12 days   Lauris PoagAlvin C Osmin Welz 07/12/2018,9:50 AM

## 2018-07-13 ENCOUNTER — Encounter (HOSPITAL_COMMUNITY): Payer: Self-pay | Admitting: Physical Medicine and Rehabilitation

## 2018-07-13 LAB — RENAL FUNCTION PANEL
ALBUMIN: 2.3 g/dL — AB (ref 3.5–5.0)
Anion gap: 13 (ref 5–15)
BUN: 101 mg/dL — AB (ref 6–20)
CHLORIDE: 106 mmol/L (ref 98–111)
CO2: 23 mmol/L (ref 22–32)
Calcium: 8 mg/dL — ABNORMAL LOW (ref 8.9–10.3)
Creatinine, Ser: 14.2 mg/dL — ABNORMAL HIGH (ref 0.61–1.24)
GFR calc non Af Amer: 3 mL/min — ABNORMAL LOW (ref >60)
GFR, EST AFRICAN AMERICAN: 4 mL/min — AB (ref >60)
Glucose, Bld: 105 mg/dL — ABNORMAL HIGH (ref 70–99)
PHOSPHORUS: 6.9 mg/dL — AB (ref 2.5–4.6)
POTASSIUM: 5.3 mmol/L — AB (ref 3.5–5.1)
Sodium: 142 mmol/L (ref 135–145)

## 2018-07-13 LAB — CBC
HCT: 25.3 % — ABNORMAL LOW (ref 39.0–52.0)
Hemoglobin: 8 g/dL — ABNORMAL LOW (ref 13.0–17.0)
MCH: 22.5 pg — ABNORMAL LOW (ref 26.0–34.0)
MCHC: 31.6 g/dL (ref 30.0–36.0)
MCV: 71.3 fL — AB (ref 78.0–100.0)
Platelets: 376 10*3/uL (ref 150–400)
RBC: 3.55 MIL/uL — ABNORMAL LOW (ref 4.22–5.81)
RDW: 15.6 % — AB (ref 11.5–15.5)
WBC: 9 10*3/uL (ref 4.0–10.5)

## 2018-07-13 LAB — MAGNESIUM
Magnesium: 3 mg/dL — ABNORMAL HIGH (ref 1.7–2.4)
Magnesium: 3.1 mg/dL — ABNORMAL HIGH (ref 1.7–2.4)

## 2018-07-13 LAB — GLUCOSE, CAPILLARY
GLUCOSE-CAPILLARY: 125 mg/dL — AB (ref 70–99)
Glucose-Capillary: 89 mg/dL (ref 70–99)
Glucose-Capillary: 93 mg/dL (ref 70–99)
Glucose-Capillary: 119 mg/dL — ABNORMAL HIGH (ref 70–99)

## 2018-07-13 LAB — HEMOGLOBIN AND HEMATOCRIT, BLOOD
HCT: 28.8 % — ABNORMAL LOW (ref 39.0–52.0)
Hemoglobin: 9 g/dL — ABNORMAL LOW (ref 13.0–17.0)

## 2018-07-13 MED ORDER — PENTAFLUOROPROP-TETRAFLUOROETH EX AERO: 1.0000 "application " | INHALATION_SPRAY | CUTANEOUS | Status: DC | PRN

## 2018-07-13 MED ORDER — ALTEPLASE 2 MG IJ SOLR: 2.0000 mg | Freq: Once | INTRAMUSCULAR | Status: DC | PRN

## 2018-07-13 MED ORDER — HEPARIN SODIUM (PORCINE) 1000 UNIT/ML DIALYSIS
20.0000 [IU]/kg | INTRAMUSCULAR | Status: DC | PRN
  Administered 2018-07-13: 1800 [IU] via INTRAVENOUS_CENTRAL
  Filled 2018-07-13 (×2): qty 2

## 2018-07-13 MED ORDER — LIDOCAINE HCL (PF) 1 % IJ SOLN: 5.0000 mL | INTRAMUSCULAR | Status: DC | PRN

## 2018-07-13 MED ORDER — HEPARIN SODIUM (PORCINE) 1000 UNIT/ML DIALYSIS
1000.0000 [IU] | INTRAMUSCULAR | Status: DC | PRN
  Administered 2018-07-13: 1000 [IU] via INTRAVENOUS_CENTRAL
  Filled 2018-07-13 (×2): qty 1

## 2018-07-13 MED ORDER — SODIUM CHLORIDE 0.9 % IV SOLN
100.0000 mL | INTRAVENOUS | Status: DC | PRN
Start: 2018-07-13 — End: 2018-07-16

## 2018-07-13 MED ORDER — SODIUM CHLORIDE 0.9 % IV SOLN: 100.0000 mL | INTRAVENOUS | Status: DC | PRN

## 2018-07-13 MED ORDER — LIDOCAINE-PRILOCAINE 2.5-2.5 % EX CREA
1.0000 "application " | TOPICAL_CREAM | CUTANEOUS | Status: DC | PRN
  Filled 2018-07-13: qty 5

## 2018-07-13 NOTE — Progress Notes (Signed)
Physical Therapy Treatment Patient Details Name: Christopher Dominguez MRN: [id number] DOB: Jan 17, 1961 Today's Date: 07/13/2018    History of Present Illness Pt is a 57 y.o. 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 CT 7/4 showed bilateral occipital edema and R frontoparietal edema, question anoxic brain injury or posterior reversible encephalopathy syndrome; no hemorrhage or midline shift. CT 7/5 showed no acute intracranial abnormality. ETT 7/4-7/10. Acute renal failure; CRRT stopped 7/12. PMH includes depression.    PT Comments    Pt with slow progress today. Following 10-15% of commands, agitated at times, restless with impulsive motions. Min A x2 for bed mobility and standing with increased time for sequential cues and redirection. Session limited by fecal incontinence and impulsive body motions compromising patient and therapist safety. Eventually returned supine for extensive pericare requiring additional staff. Will cont to follow and progress OOB mobility as tolerated.    Follow Up Recommendations  CIR;Supervision/Assistance - 24 hour     Equipment Recommendations  Other (comment)(TBD)    Recommendations for Other Services OT consult;Speech consult;Rehab consult     Precautions / Restrictions Precautions Precautions: Fall Restrictions Weight Bearing Restrictions: No    Mobility  Bed Mobility Overal bed mobility: Needs Assistance Bed Mobility: Supine to Sit     Supine to sit: Min assist;+2 for safety/equipment     General bed mobility comments: multimodal sequential cues to come to sitting. min A to assist up  Transfers Overall transfer level: Needs assistance Equipment used: Rolling walker (2 wheeled) Transfers: Sit to/from Stand Sit to Stand: Min assist;+2 safety/equipment         General transfer comment: min A x2 to stand up, patient with fecal incontinence and began stepping in and  wiping and throwing with mittens, returned to supine for safety and extensive cleaning.   Ambulation/Gait                 Stairs             Wheelchair Mobility    Modified Rankin (Stroke Patients Only)       Balance Overall balance assessment: Needs assistance Sitting-balance support: Feet supported;Bilateral upper extremity supported Sitting balance-Leahy Scale: Poor       Standing balance-Leahy Scale: Zero                              Cognition Arousal/Alertness: Awake/alert Behavior During Therapy: Impulsive;Anxious;Agitated;Restless Overall Cognitive Status: Impaired/Different from baseline Area of Impairment: Orientation;Attention;Memory;Following commands;Safety/judgement;Problem solving                 Orientation Level: Disoriented to;Person;Place;Time;Situation Current Attention Level: Focused Memory: Decreased recall of precautions;Decreased short-term memory Following Commands: Follows one step commands inconsistently Safety/Judgement: Decreased awareness of safety;Decreased awareness of deficits   Problem Solving: Slow processing;Decreased initiation;Difficulty sequencing;Requires verbal cues;Requires tactile cues General Comments: Pt following commands 10% of time, distracted, nonsensical and inappropriate speech. Swearing and at times slightly agitated. in mittens currently.       Exercises      General Comments        Pertinent Vitals/Pain Pain Assessment: Faces Pain Score: 0-No pain    Home Living                      Prior Function            PT Goals (current goals can now be found in the care plan  section) Acute Rehab PT Goals Patient Stated Goal: pt did not state a goal PT Goal Formulation: With family Time For Goal Achievement: 07/23/18 Potential to Achieve Goals: Good Progress towards PT goals: Progressing toward goals    Frequency    Min 3X/week      PT Plan Current plan remains  appropriate    Co-evaluation              AM-PAC PT "6 Clicks" Daily Activity  Outcome Measure  Difficulty turning over in bed (including adjusting bedclothes, sheets and blankets)?: Unable Difficulty moving from lying on back to sitting on the side of the bed? : Unable Difficulty sitting down on and standing up from a chair with arms (e.g., wheelchair, bedside commode, etc,.)?: Unable Help needed moving to and from a bed to chair (including a wheelchair)?: Total Help needed walking in hospital room?: Total Help needed climbing 3-5 steps with a railing? : Total 6 Click Score: 6    End of Session Equipment Utilized During Treatment: Gait belt Activity Tolerance: Patient tolerated treatment well Patient left: in bed;with bed alarm set;with family/visitor present Nurse Communication: Mobility status PT Visit Diagnosis: Other abnormalities of gait and mobility (R26.89);Other symptoms and signs involving the nervous system (R29.898);Unsteadiness on feet (R26.81)     Time: 1400-1430 PT Time Calculation (min) (ACUTE ONLY): 30 min  Charges:  $Therapeutic Activity: 23-37 mins                    G Codes:      Etta GrandchildSean Rashana Andrew, PT, DPT Acute Rehab Services Pager: [phone]     Etta GrandchildSean Saria Haran 07/13/2018, 4:33 PM

## 2018-07-13 NOTE — Progress Notes (Signed)
Assessment/ Plan:  57 y/o manh/o cardiac arrest   1 AKI secondary to arrest.S/p CRRT and IHD, temp IJ cath >>>HD(will decide about need for a tunneled cath next week)   2 HTN- controlled 3 Anemia stable 4s/pArrest 5 H/o Substance abuse 6Enceph post arrest  Subjective: Interval History: s/p HD this AM  Objective: Vital signs in last 24 hours: Temp:  [98 F (36.7 C)-99.3 F (37.4 C)] 99.3 F (37.4 C) (07/17 1200) Pulse Rate:  [64-84] 72 (07/17 1200) Resp:  [15-25] 17 (07/17 1200) BP: (116-150)/(65-88) 150/88 (07/17 1200) SpO2:  [94 %-99 %] 99 % (07/17 1200) Weight:  [90.5 kg (199 lb 8.3 oz)-91.5 kg (201 lb 11.5 oz)] 90.5 kg (199 lb 8.3 oz) (07/17 0441) Weight change: 1.7 kg (3 lb 12 oz)  Intake/Output from previous day: 07/16 0701 - 07/17 0700 In: -  Out: 1006  Intake/Output this shift: No intake/output data recorded.  General appearance: cooperative and agitated Resp: clear to auscultation bilaterally Cardio: regular rate and rhythm, S1, S2 normal, no murmur, click, rub or gallop Extremities: extremities normal, atraumatic, no cyanosis or edema and hands uncontrollable in mittens  Lab Results: Recent Labs    07/11/18 0301 07/13/18 0130 07/13/18 1408  WBC 10.7* 9.0  --   HGB 8.8* 8.0* 9.0*  HCT 28.2* 25.3* 28.8*  PLT 282 376  --    BMET:  Recent Labs    07/12/18 0732 07/13/18 0130  NA 142 142  K 4.9 5.3*  CL 106 106  CO2 24 23  GLUCOSE 103* 105*  BUN 81* 101*  CREATININE 12.58* 14.20*  CALCIUM 8.3* 8.0*   No results for input(s): PTH in the last 72 hours. Iron Studies: No results for input(s): IRON, TIBC, TRANSFERRIN, FERRITIN in the last 72 hours. Studies/Results: No results found.  Scheduled: . chlorhexidine  15 mL Mouth Rinse BID  . Chlorhexidine Gluconate Cloth  6 each Topical Q0600  . Chlorhexidine Gluconate Cloth  6 each Topical Q0600  . feeding supplement (NEPRO CARB STEADY)  237 mL Oral BID BM  . heparin injection (subcutaneous)   5,000 Units Subcutaneous Q8H  . pantoprazole  40 mg Oral Q1200  . tamsulosin  0.4 mg Oral Daily     LOS: 13 days   Lauris PoagAlvin C Somaya Grassi 07/13/2018,7:51 PM \

## 2018-07-13 NOTE — Plan of Care (Signed)
  Problem: Activity: Goal: Risk for activity intolerance will decrease Outcome: Progressing   Problem: Coping: Goal: Level of anxiety will decrease Outcome: Progressing   

## 2018-07-13 NOTE — Progress Notes (Signed)
PROGRESS NOTE    Christopher Dominguez  OZH:[id number] DOB: 1961-10-24 DOA: 06/30/2018 PCP: Patient, No Pcp Per   Brief Narrative:  57 yo with no significant past medical history, originally from [place] admitted to critical care on 7/4 following a cardiac arrest. -Hospitalization complicated by acute kidney injury requiring hemodialysis, chorea post extubation, encephalopathy from anoxic brain injury, cardiomyopathy with EF down to 25% which recovered to 55% range in 3 days on repeat echo. 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 - EF was 25-30% on admission which improved to 55-60% without evidence of any current ongoing motion abnormality -Significantly elevated troponin on admission felt to be due to CPR and cardiac arrest and not ACS -Seen by cardiology this admission -Plan for ischemia evaluation once stable, too confused at this time -> last note I see from cards is from 7/7, will need to reach out again regarding plan for ischemic eval    Acute respiratory failure with hypoxemia (HCC) - following cardiac arrest - Extubated 7/10 - Also treated for aspiration pneumonia, completed antibiotics - Continue pulmonary toilet, incentive spirometer, nebs as needed - Currently doing well on room air   Encephalopathy  anoxic brain injury  Post Hypoxic Chroea -seen by neurology, noted to have waxing and waning mental status (A&Ox2 this afternoon on my evaluation) -Haldol when necessary (recommended not continuing this long term), gabapentin could be tried as an alternative if movements  are interfering with rehab and less sedating alternative needed (gabapentin 300 mg BID(  -Will need long-term rehabilitation and outpatient neurology follow-up (if persistent deficits and abnormal movements after rehab) -SLP following on dysphagia 2 diet  Acute renal failure -Due to cardiac arrest, shock -Renal following, was getting CRRT -intermittent HD now, has right temporary IJ HD catheter -Last dialysis yesterday -200 cc UOP today  Hematuria/enlarged prostate -Seen by urology -Given very large prostate recommended to avoid Foley unless residuals greater than 500 -continue Flomax   Transient rectal bleeding -Resolved, seen by GI, supportive care for now -Subcutaneous heparin held -> now resumed, continue SCDs for DVT prophylaxis   shock liver -Transaminitis improving -Trend LFTs  Anemia: downtrending, follow H/H.  Follow iron/b12/folate/ferritin.  DVT prophylaxis: heparin Code Status: full  Family Communication: sister at bedside Disposition Plan: pending.  ?inpatient rehab   Consultants:   Renal  Cards  GI  Urology  Procedures:   R IJ HD catheter CRRT followed by IHD  07/04/18 Study Conclusions  - Left ventricle: The cavity size was normal. Wall thickness was   normal. Systolic function was normal. The estimated ejection   fraction was in the range of 55% to 60%. Wall motion was normal;   there were no regional wall motion abnormalities. - Aortic root: The aortic root was mildly dilated. - Right ventricle: The cavity size was mildly dilated.  Impressions:  - Normal LV function; mildly dilated aortic root; mild RVE.  07/01/18 Study Conclusions  - Left ventricle: The cavity size was normal. Wall thickness was   increased in a pattern of mild LVH. Systolic function was  severely reduced. The estimated ejection fraction was in the   range of 25% to 30%. Diffuse hypokinesis. Doppler parameters are   consistent with abnormal left ventricular  relaxation (grade 1   diastolic dysfunction). - Aortic root: The aortic root was mildly dilated. - Mitral valve: Calcified annulus. - Right ventricle: The cavity size was mildly dilated. Systolic   function was severely reduced. - Right atrium: The atrium was mildly dilated.  Impressions:  - Technically difficult; definity used; severe LV dysfunction; mild   LVH; mild diastolic dysfunction; enlarged right side with   moderate to severe RV dysfunction.  7/5 Final Interpretation: Right: There is no evidence of deep vein thrombosis in the lower extremity. No cystic structure found in the popliteal fossa. Left: There is no evidence of deep vein thrombosis in the lower extremity. However, portions of this examination were limited- see technologist comments above. No cystic structure found in the popliteal fossa.  Antimicrobials:  Anti-infectives (From admission, onward)   Start     Dose/Rate Route Frequency Ordered Stop   07/06/18 1200  piperacillin-tazobactam (ZOSYN) IVPB 3.375 g    Note to Pharmacy:  Please stop after today   3.375 g 100 mL/hr over 30 Minutes Intravenous Every 6 hours 07/06/18 1015 07/06/18 1932   07/02/18 1800  piperacillin-tazobactam (ZOSYN) IVPB 3.375 g  Status:  Discontinued     3.375 g 12.5 mL/hr over 240 Minutes Intravenous Every 6 hours 07/02/18 1309 07/02/18 1310   07/02/18 1800  piperacillin-tazobactam (ZOSYN) IVPB 3.375 g  Status:  Discontinued     3.375 g 100 mL/hr over 30 Minutes Intravenous Every 6 hours 07/02/18 1311 07/06/18 1015   07/02/18 1200  piperacillin-tazobactam (ZOSYN) IVPB 2.25 g  Status:  Discontinued     2.25 g 100 mL/hr over 30 Minutes Intravenous Every 6 hours 07/02/18 0728 07/02/18 1309   07/01/18 0100  piperacillin-tazobactam (ZOSYN) IVPB 3.375 g  Status:  Discontinued     3.375 g 12.5 mL/hr over 240 Minutes Intravenous Every 8 hours 06/30/18 1812 07/02/18 0728   06/30/18 1815  piperacillin-tazobactam (ZOSYN) IVPB 3.375 g      3.375 g 100 mL/hr over 30 Minutes Intravenous  Once 06/30/18 1804 06/30/18 1945      Subjective: No complaints. Confused. A&Ox2.   Objective: Vitals:   07/13/18 0430 07/13/18 0441 07/13/18 0517 07/13/18 1200  BP: 131/76 131/67 138/75 (!) 150/88  Pulse: 82 84 80 72  Resp: 15 (!) 21 18 17   Temp:  98 F (36.7 C) 99 F (37.2 C) 99.3 F (37.4 C)  TempSrc:  Oral Oral Oral  SpO2: 95% 96% 97% 99%  Weight:  90.5 kg (199 lb 8.3 oz)    Height:        Intake/Output Summary (Last 24 hours) at 07/13/2018 1601 Last data filed at 07/13/2018 1417 Gross per 24 hour  Intake 360 ml  Output 1206 ml  Net -846 ml   Filed Weights   07/12/18 0600 07/13/18 0047 07/13/18 0441  Weight: 89.8 kg (197 lb 15.6 oz) 91.5 kg (201 lb 11.5 oz) 90.5 kg (199 lb 8.3 oz)    Examination:  General exam: Appears calm and comfortable  Respiratory system: Clear to auscultation. Respiratory effort normal. Cardiovascular system: S1 & S2 heard, RRR. No JVD, murmurs, rubs, gallops or clicks. No pedal edema. Gastrointestinal system: Abdomen is nondistended, soft and nontender. No organomegaly or masses felt. Normal bowel sounds heard. Central nervous system: Alert and oriented x2 (says 7th month, doesn't know location). No focal  neurological deficits. Skin: No rashes, lesions or ulcers Psychiatry: Judgement and insight appear normal. Mood & affect appropriate.  R IJ catheter   Data Reviewed: I have personally reviewed following labs and imaging studies  CBC: Recent Labs  Lab 07/08/18 1541 07/08/18 2146 07/09/18 0342 07/10/18 0240 07/11/18 0301 07/13/18 0130 07/13/18 1408  WBC 14.6* 12.5* 11.5* 12.0* 10.7* 9.0  --   NEUTROABS 9.7* 8.3* 7.1 8.3* 7.9*  --   --   HGB 10.8* 9.6* 9.2* 9.6* 8.8* 8.0* 9.0*  HCT 35.1* 30.8* 30.2* 31.4* 28.2* 25.3* 28.8*  MCV 71.2* 71.1* 71.6* 71.7* 70.1* 71.3*  --   PLT 197 186 192 233 282 376  --    Basic Metabolic Panel: Recent Labs  Lab 07/09/18 0342 07/10/18 0240  07/11/18 0301 07/12/18 0732 07/13/18 0130  NA 139 142  142 143 142 142  K 4.2 4.2  4.3 4.2 4.9 5.3*  CL 105 110  110 106 106 106  CO2 24 21*  20* 27 24 23   GLUCOSE 123* 110*  108* 110* 103* 105*  BUN 44* 78*  76* 55* 81* 101*  CREATININE 5.67* 8.81*  8.82* 8.46* 12.58* 14.20*  CALCIUM 8.1* 8.2*  8.4* 8.0* 8.3* 8.0*  MG 3.1* 3.5* 2.6* 2.9* 3.0*  3.1*  PHOS 5.8* 5.9* 4.5 6.8* 6.9*   GFR: Estimated Creatinine Clearance: 6.5 mL/min (A) (by C-G formula based on SCr of 14.2 mg/dL (H)). Liver Function Tests: Recent Labs  Lab 07/07/18 0351  07/09/18 0342 07/10/18 0240 07/11/18 0301 07/12/18 0732 07/13/18 0130  AST 40  --   --   --   --   --   --   ALT 333*  --   --   --   --   --   --   ALKPHOS 81  --   --   --   --   --   --   BILITOT 1.0  --   --   --   --   --   --   PROT 6.2*  --   --   --   --   --   --   ALBUMIN 2.6*  2.6*   < > 2.3* 2.5* 2.3* 2.4* 2.3*   < > = values in this interval not displayed.   No results for input(s): LIPASE, AMYLASE in the last 168 hours. No results for input(s): AMMONIA in the last 168 hours. Coagulation Profile: No results for input(s): INR, PROTIME in the last 168 hours. Cardiac Enzymes: No results for input(s): CKTOTAL, CKMB, CKMBINDEX, TROPONINI in the last 168 hours. BNP (last 3 results) No results for input(s): PROBNP in the last 8760 hours. HbA1C: No results for input(s): HGBA1C in the last 72 hours. CBG: Recent Labs  Lab 07/12/18 1646 07/12/18 2127 07/13/18 0518 07/13/18 0838 07/13/18 1200  GLUCAP 110* 135* 89 93 125*   Lipid Profile: No results for input(s): CHOL, HDL, LDLCALC, TRIG, CHOLHDL, LDLDIRECT in the last 72 hours. Thyroid Function Tests: No results for input(s): TSH, T4TOTAL, FREET4, T3FREE, THYROIDAB in the last 72 hours. Anemia Panel: No results for input(s): VITAMINB12, FOLATE, FERRITIN, TIBC, IRON, RETICCTPCT in the last 72 hours. Sepsis Labs: No results for input(s): PROCALCITON, LATICACIDVEN in  the last 168 hours.  No results found for this or any previous visit (from the past 240 hour(s)).       Radiology Studies: No results found.      Scheduled Meds: . chlorhexidine  15 mL Mouth  Rinse BID  . Chlorhexidine Gluconate Cloth  6 each Topical Q0600  . Chlorhexidine Gluconate Cloth  6 each Topical Q0600  . feeding supplement (NEPRO CARB STEADY)  237 mL Oral BID BM  . heparin injection (subcutaneous)  5,000 Units Subcutaneous Q8H  . pantoprazole  40 mg Oral Q1200  . tamsulosin  0.4 mg Oral Daily   Continuous Infusions: . sodium chloride 10 mL/hr at 07/07/18 0100  . sodium chloride    . sodium chloride       LOS: 13 days    Time spent: over 30 min    Lacretia Nicks, MD [facility] Pager [phone]  If 7PM-7AM, please contact night-coverage [email or web address] Password [email or web address] 07/13/2018, 4:01 PM

## 2018-07-13 NOTE — Progress Notes (Signed)
HD tx completed @ 0430 w/o problem other than pt is not redirectable. Pt continually turned head toward cath/turned body far away from machine pulling on lines/raised arm above head all occluding HD cath and causing multiple alarms throughout tx, no matter the redirection the pt would not listen, UF goal met, blood rinsed back, VSS, report called to Magdalen Spatz, RN

## 2018-07-13 NOTE — Progress Notes (Signed)
HD tx initiated via HD cath w/o problem, bilat ports: pull/push/flush equally w/o problem, VSS, will cont to monitor while on HD tx 

## 2018-07-13 NOTE — Consult Note (Signed)
Physical Medicine and Rehabilitation Consult   Reason for Consult: Anoxic BI.  Referring Physician: Dr. Salena Saner. Lowell Guitar   HPI: Christopher Dominguez is a 57 y.o. male admitted after found down with asystole and had ROSC after CPR x 26 minutes and 7 amps of epi. He was treated with cooling protocol. UDS positive for cocaine and THC.  EKG with QT prolongation and 2 D echo revealed EF 25-30% with diffuse hypokinesis. 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 He tolerated extubation without difficulty and haldol added to manage post hypoxic chorea v/s akithisia. He has been transitioned to HD due to poor renal recovery. Mentation has improved but he continues to exhibit inappropriate behaviors with poor safety, cognitive deficits and balance deficits. CIR recommended due to functional deficits.    Review of Systems  Unable to perform ROS: Mental acuity      Past Medical History:  Diagnosis Date  . Depression   . Prostate enlargement   . Substance abuse (HCC)     History reviewed. No pertinent surgical history.    Family History  Problem Relation Age of Onset  . Dementia Mother   . Depression Sister     Social History:  Used to live with GF--sister in town reports son plans on rehab in [place] (closer to family). Disable due to "mental illness".  Per reports that he has never smoked. He has never used smokeless tobacco. Per reports that he does not drink alcohol or use drugs.    Allergies: No Known Allergies    Medications Prior to Admission  Medication Sig Dispense Refill  . acetaminophen (TYLENOL) 500 MG tablet Take 1,000 mg by mouth every 6 (six) hours as needed for moderate pain.    Marland Kitchen ibuprofen (ADVIL,MOTRIN) 200 MG tablet Take 400 mg by mouth as needed for headache or mild pain.    . tamsulosin  (FLOMAX) 0.4 MG CAPS capsule Take 0.4 mg by mouth daily.  1  . diclofenac (VOLTAREN) 75 MG EC tablet Take 1 tablet (75 mg total) by mouth 2 (two) times daily. (Patient not taking: Reported on 06/30/2018) 60 tablet 0  . diclofenac sodium (VOLTAREN) 1 % GEL Apply 2-4 g topically 4 (four) times daily. (Patient not taking: Reported on 06/30/2018) 100 g 0  . docusate sodium (COLACE) 100 MG capsule Take 1 capsule (100 mg total) by mouth every 12 (twelve) hours. (Patient not taking: Reported on 06/30/2018) 60 capsule 0  . naproxen (NAPROSYN) 500 MG tablet Take 1 tablet (500 mg total) by mouth 3 (three) times daily with meals. (Patient not taking: Reported on 06/30/2018) 30 tablet 0    Home: Home Living Family/patient expects to be discharged to:: Private residence Living Arrangements: Spouse/significant other(Girlfriend) Available Help at Discharge: Family, Friend(s), Available PRN/intermittently Type of Home: Apartment Additional Comments: Sister reports pt lives in apartment with girflriend. Son and many other family members live in [place]; potentially plan to have pt do rehab in [place]  Lives With: Significant other  Functional History: Prior Function Level of Independence: Independent Comments: Sister reports last she knew, pt was working at [employer] Functional Status:  Mobility: Bed Mobility Overal bed mobility: Needs Assistance Bed Mobility: Supine to Sit Supine to sit: Min assist, +2 for safety/equipment Sit to supine: Max assist General bed mobility comments: multimodal sequential cues to come to sitting.  min A to assist up Transfers Overall transfer level: Needs assistance Equipment used: Rolling walker (2 wheeled) Transfers: Sit to/from Stand Sit to Stand: Min assist, +2 safety/equipment General transfer comment: min A x2 to stand up, patient with fecal incontinence and began stepping in and wiping and throwing with mittens, returned to supine for safety and extensive cleaning.    Ambulation/Gait Ambulation/Gait assistance: Max assist, +2 physical assistance, +2 safety/equipment Gait Distance (Feet): 100 Feet Assistive device: 2 person hand held assist Gait Pattern/deviations: Scissoring, Drifts right/left, Narrow base of support General Gait Details: Pt with narrow BOS and scissored gait throughout with periods of right knee buckling. Bil UE support for gait with 3rd person to manage lines. Physical assist for balance, stability and anterior translation. Turning to left on first trial pt able to take steps and change direction. On second trial pt planting right foot posterior, with posterior lean and lifting left leg with chair needed to be pulled to him.  Gait velocity interpretation: 1.31 - 2.62 ft/sec, indicative of limited community ambulator    ADL: ADL Overall ADL's : Needs assistance/impaired Eating/Feeding: Minimal assistance Grooming: Minimal assistance, Cueing for sequencing Upper Body Bathing: Cueing for sequencing, Moderate assistance Lower Body Bathing: Cueing for sequencing, Moderate assistance Upper Body Dressing : Cueing for sequencing, Moderate assistance Lower Body Dressing: Moderate assistance, Cueing for sequencing Lower Body Dressing Details (indicate cue type and reason): pt able to sit EOB and lean over to adjust socks with modA for stability  Toilet Transfer: Moderate assistance, +2 for physical assistance, Ambulation, Cueing for sequencing Toileting- Clothing Manipulation and Hygiene: Moderate assistance, +2 for physical assistance Toileting - Clothing Manipulation Details (indicate cue type and reason): pt required max VC for redirection to task to complete pericare in standing;pt required modA +2 for stability while standing during functional task Functional mobility during ADLs: Moderate assistance, +2 for physical assistance, Cueing for sequencing General ADL Comments: pt highly distractable, required max multimodal cues for redirection  to task  Cognition: Cognition Overall Cognitive Status: Impaired/Different from baseline Arousal/Alertness: Awake/alert Orientation Level: Oriented to person Attention: Focused Focused Attention: Appears intact Memory: Impaired Memory Impairment: Decreased short term memory Decreased Short Term Memory: Verbal basic, Functional basic Awareness: Impaired Awareness Impairment: Intellectual impairment Problem Solving: Impaired Problem Solving Impairment: Verbal basic, Functional basic Cognition Arousal/Alertness: Awake/alert Behavior During Therapy: Impulsive, Anxious, Agitated, Restless Overall Cognitive Status: Impaired/Different from baseline Area of Impairment: Orientation, Attention, Memory, Following commands, Safety/judgement, Problem solving Orientation Level: Disoriented to, Person, Place, Time, Situation Current Attention Level: Focused Memory: Decreased recall of precautions, Decreased short-term memory Following Commands: Follows one step commands inconsistently Safety/Judgement: Decreased awareness of safety, Decreased awareness of deficits Problem Solving: Slow processing, Decreased initiation, Difficulty sequencing, Requires verbal cues, Requires tactile cues General Comments: Pt following commands 10% of time, distracted, nonsensical and inappropriate speech. Swearing and at times slightly agitated. in mittens currently.  Difficult to assess due to: Level of arousal   Blood pressure (!) 150/88, pulse 72, temperature 99.3 F (37.4 C), temperature source Oral, resp. rate 17, height 5\' 11"  (1.803 m), weight 90.5 kg (199 lb 8.3 oz), SpO2 99 %. Physical Exam  Nursing note and vitals reviewed. Constitutional: No distress.  Lying in bed with bilateral mittens in place. Restless movements.   HENT:  Head: Normocephalic.  Eyes: Pupils are equal, round, and reactive to light.  Neck: Normal range of motion.  Cardiovascular: Normal rate.  Respiratory: Effort normal.   Neurological:  Oriented to self only. Needed cues to recall  sister's name. Language of confusion and needed cues to focus and participate in exam.   Skin: Skin is warm.    Results for orders placed or performed during the hospital encounter of 06/30/18 (from the past 24 hour(s))  Glucose, capillary     Status: Abnormal   Collection Time: 07/12/18  9:27 PM  Result Value Ref Range   Glucose-Capillary 135 (H) 70 - 99 mg/dL  Magnesium     Status: Abnormal   Collection Time: 07/13/18  1:30 AM  Result Value Ref Range   Magnesium 3.1 (H) 1.7 - 2.4 mg/dL  CBC     Status: Abnormal   Collection Time: 07/13/18  1:30 AM  Result Value Ref Range   WBC 9.0 4.0 - 10.5 K/uL   RBC 3.55 (L) 4.22 - 5.81 MIL/uL   Hemoglobin 8.0 (L) 13.0 - 17.0 g/dL   HCT 96.025.3 (L) 45.439.0 - 09.852.0 %   MCV 71.3 (L) 78.0 - 100.0 fL   MCH 22.5 (L) 26.0 - 34.0 pg   MCHC 31.6 30.0 - 36.0 g/dL   RDW 11.915.6 (H) 14.711.5 - 82.915.5 %   Platelets 376 150 - 400 K/uL  Renal function panel     Status: Abnormal   Collection Time: 07/13/18  1:30 AM  Result Value Ref Range   Sodium 142 135 - 145 mmol/L   Potassium 5.3 (H) 3.5 - 5.1 mmol/L   Chloride 106 98 - 111 mmol/L   CO2 23 22 - 32 mmol/L   Glucose, Bld 105 (H) 70 - 99 mg/dL   BUN 562101 (H) 6 - 20 mg/dL   Creatinine, Ser 13.0814.20 (H) 0.61 - 1.24 mg/dL   Calcium 8.0 (L) 8.9 - 10.3 mg/dL   Phosphorus 6.9 (H) 2.5 - 4.6 mg/dL   Albumin 2.3 (L) 3.5 - 5.0 g/dL   GFR calc non Af Amer 3 (L) >60 mL/min   GFR calc Af Amer 4 (L) >60 mL/min   Anion gap 13 5 - 15  Magnesium     Status: Abnormal   Collection Time: 07/13/18  1:30 AM  Result Value Ref Range   Magnesium 3.0 (H) 1.7 - 2.4 mg/dL  Glucose, capillary     Status: None   Collection Time: 07/13/18  5:18 AM  Result Value Ref Range   Glucose-Capillary 89 70 - 99 mg/dL  Glucose, capillary     Status: None   Collection Time: 07/13/18  8:38 AM  Result Value Ref Range   Glucose-Capillary 93 70 - 99 mg/dL  Glucose, capillary     Status:  Abnormal   Collection Time: 07/13/18 12:00 PM  Result Value Ref Range   Glucose-Capillary 125 (H) 70 - 99 mg/dL  Hemoglobin and hematocrit, blood     Status: Abnormal   Collection Time: 07/13/18  2:08 PM  Result Value Ref Range   Hemoglobin 9.0 (L) 13.0 - 17.0 g/dL   HCT 65.728.8 (L) 84.639.0 - 96.252.0 %   No results found.   Assessment/Plan: Diagnosis: anoxic BI 1. Does the need for close, 24 hr/day medical supervision in concert with the patient's rehab needs make it unreasonable for this patient to be served in a less intensive setting? Yes 2. Co-Morbidities requiring supervision/potential complications: CM, AKI, polysubstance abuse 3. Due to bladder management, bowel management, safety, skin/wound care, disease management, medication administration, pain management and patient education, does the patient require 24 hr/day rehab nursing? Yes 4. Does the patient require coordinated care of a physician, rehab nurse, PT (1-2 hrs/day, 5  days/week), OT (1-2 hrs/day, 5 days/week) and SLP (1-2 hrs/day, 5 days/week) to address physical and functional deficits in the context of the above medical diagnosis(es)? Yes Addressing deficits in the following areas: balance, endurance, locomotion, strength, transferring, bowel/bladder control, bathing, dressing, feeding, grooming, toileting, cognition and psychosocial support 5. Can the patient actively participate in an intensive therapy program of at least 3 hrs of therapy per day at least 5 days per week? Yes 6. The potential for patient to make measurable gains while on inpatient rehab is excellent 7. Anticipated functional outcomes upon discharge from inpatient rehab are supervision  with PT, supervision with OT, supervision and min assist with SLP. 8. Estimated rehab length of stay to reach the above functional goals is: 10-16 days 9. Anticipated D/C setting: Home 10. Anticipated post D/C treatments: HH therapy and Outpatient therapy 11. Overall  Rehab/Functional Prognosis: good  RECOMMENDATIONS: This patient's condition is appropriate for continued rehabilitative care in the following setting: CIR Patient has agreed to participate in recommended program. yes, sister agrees Note that insurance prior authorization may be required for reimbursement for recommended care.  Comment: Spoke at length with sister. Pt will go back to [place] to live with family after discharge. Rehab Admissions Coordinator to follow up.  Thanks,  Ranelle Oyster, MD, [place] [facility]  I have personally performed a face to face diagnostic evaluation of this patient. Additionally, I have reviewed and concur with the physician assistant's documentation above.    Jacquelynn Cree, PA-C 07/13/2018

## 2018-07-14 LAB — RENAL FUNCTION PANEL
Albumin: 2.4 g/dL — ABNORMAL LOW (ref 3.5–5.0)
Anion gap: 14 (ref 5–15)
BUN: 71 mg/dL — AB (ref 6–20)
CO2: 27 mmol/L (ref 22–32)
CREATININE: 11.44 mg/dL — AB (ref 0.61–1.24)
Calcium: 8.1 mg/dL — ABNORMAL LOW (ref 8.9–10.3)
Chloride: 101 mmol/L (ref 98–111)
GFR calc non Af Amer: 4 mL/min — ABNORMAL LOW (ref >60)
GFR, EST AFRICAN AMERICAN: 5 mL/min — AB (ref >60)
Glucose, Bld: 98 mg/dL (ref 70–99)
POTASSIUM: 5.2 mmol/L — AB (ref 3.5–5.1)
Phosphorus: 5.8 mg/dL — ABNORMAL HIGH (ref 2.5–4.6)
Sodium: 142 mmol/L (ref 135–145)

## 2018-07-14 LAB — CBC
HCT: 27.8 % — ABNORMAL LOW (ref 39.0–52.0)
HEMOGLOBIN: 8.7 g/dL — AB (ref 13.0–17.0)
MCH: 22.3 pg — ABNORMAL LOW (ref 26.0–34.0)
MCHC: 31.3 g/dL (ref 30.0–36.0)
MCV: 71.1 fL — AB (ref 78.0–100.0)
Platelets: 445 10*3/uL — ABNORMAL HIGH (ref 150–400)
RBC: 3.91 MIL/uL — ABNORMAL LOW (ref 4.22–5.81)
RDW: 15.2 % (ref 11.5–15.5)
WBC: 8 10*3/uL (ref 4.0–10.5)

## 2018-07-14 LAB — IRON AND TIBC
Iron: 36 ug/dL — ABNORMAL LOW (ref 45–182)
Saturation Ratios: 13 % — ABNORMAL LOW (ref 17.9–39.5)
TIBC: 267 ug/dL (ref 250–450)
UIBC: 231 ug/dL

## 2018-07-14 LAB — HEPATIC FUNCTION PANEL
ALK PHOS: 58 U/L (ref 38–126)
ALT: 39 U/L (ref 0–44)
AST: 21 U/L (ref 15–41)
Albumin: 2.5 g/dL — ABNORMAL LOW (ref 3.5–5.0)
BILIRUBIN DIRECT: 0.1 mg/dL (ref 0.0–0.2)
BILIRUBIN TOTAL: 0.4 mg/dL (ref 0.3–1.2)
Indirect Bilirubin: 0.3 mg/dL (ref 0.3–0.9)
Total Protein: 6.2 g/dL — ABNORMAL LOW (ref 6.5–8.1)

## 2018-07-14 LAB — FOLATE: FOLATE: 10.6 ng/mL (ref >5.9)

## 2018-07-14 LAB — VITAMIN B12: VITAMIN B 12: 782 pg/mL (ref 180–914)

## 2018-07-14 LAB — GLUCOSE, CAPILLARY
GLUCOSE-CAPILLARY: 108 mg/dL — AB (ref 70–99)
GLUCOSE-CAPILLARY: 114 mg/dL — AB (ref 70–99)
GLUCOSE-CAPILLARY: 114 mg/dL — AB (ref 70–99)
Glucose-Capillary: 92 mg/dL (ref 70–99)
Glucose-Capillary: 87 mg/dL (ref 70–99)
Glucose-Capillary: 127 mg/dL — ABNORMAL HIGH (ref 70–99)
Glucose-Capillary: 91 mg/dL (ref 70–99)

## 2018-07-14 LAB — FERRITIN: FERRITIN: 160 ng/mL (ref 24–336)

## 2018-07-14 LAB — MAGNESIUM: Magnesium: 2.4 mg/dL (ref 1.7–2.4)

## 2018-07-14 MED ORDER — FERROUS SULFATE 325 (65 FE) MG PO TABS
325.0000 mg | ORAL_TABLET | Freq: Every day | ORAL | Status: DC
  Administered 2018-07-16 – 2018-08-12 (×28): 325 mg via ORAL
  Filled 2018-07-14 (×32): qty 1

## 2018-07-14 MED ORDER — CHLORHEXIDINE GLUCONATE CLOTH 2 % EX PADS
6.0000 | MEDICATED_PAD | Freq: Every day | CUTANEOUS | Status: DC
  Administered 2018-07-15 – 2018-07-19 (×3): 6 via TOPICAL

## 2018-07-14 NOTE — Progress Notes (Signed)
Patient's head of bed elevated to 90 degrees with all meals and medications.

## 2018-07-14 NOTE — Progress Notes (Signed)
  Speech Language Pathology Treatment: Dysphagia  Patient Details Name: Christopher Dominguez MRN: [id number] DOB: 10-27-1961 Today's Date: 07/14/2018 Time: 1435-1500 SLP Time Calculation (min) (ACUTE ONLY): 25 min  Assessment / Plan / Recommendation Clinical Impression  Pt was very alert during today's evaluation but continues to exhibit impulsivity (large bites noted, inappropriate touching of therapist, urgent attempt to exit bed).  Pt tolerated regular solids and thin liquid with no clinical s/s of aspiration. Pt exhibited good oral clearance with an without dentures in place.  Pt consumed first bite without dentures, and then requested that they be put in. Family placed dentures for further PO trials.  Family and RN report good tolerance of current diet.  Sister endorses feeding more advanced consistencies (i.e. small pieces of hot dog) and following with thin liquid without difficulty.  Pt will continue to need assistance with meals 2/2 mitt restraints at present, but if pt can have mitts removed for self feeding, he will need cuing to go slow and take small bites.  With encouragement today, pt was able to modify bolus size.  Initially pt placed entire cracker in oral cavity. No new chest imaging since upgrade to dysphagia 2.  Recommend advancing to regular diet with thin liquid.  Pt will need full assist with meals.   HPI HPI: 57 y/o male had a cardiac arrest out of hospital and received 7 rounds of epinephrine to regain a pulse. Tox positive for cocaine. Intubated from 7/4-7/10. Puree diet initated with upgrade to dysphagia 2 on 7/15      SLP Plan  Continue with current plan of care       Recommendations  Diet recommendations: Regular;Thin liquid Liquids provided via: Cup;Straw Medication Administration: Whole meds with puree Supervision: Staff to assist with self feeding;Full supervision/cueing for compensatory strategies Compensations: Slow rate;Small sips/bites(Alternate  solids/liquids as needed) Postural Changes and/or Swallow Maneuvers: Seated upright 90 degrees                SLP Visit Diagnosis: Dysphagia, oropharyngeal phase (R13.12) Plan: Continue with current plan of care       GO                Festus Pursel E Indie Nickerson, MA, CCC-SLP 07/14/2018, 3:12 PM

## 2018-07-14 NOTE — Progress Notes (Signed)
Inpatient Rehabilitation  Met with patient and sister, Shirlee Limerick at bedside to discuss team's recommendation for IP Rehab.  Shared booklets, discussed insurance, and answered initial questions.  Sister reported being very surprised and overwhelmed by anticipated length of stay and expected outcomes.  She had me also discuss information with her daughter, Aliene Altes via phone so that she could relay it to patient's son, Breckyn "since he is hard to track down and get a hold of."  Family originally though that after IP Rehab here they could then get patient admitted to longer-term rehab in [place], which is where family can assist patient some and they want him to live.  After clarifying that the discharge plan following an IP Rehab stay is home with family providing 24/7 assist family stated that is not what they want.  Family stated that they will discuss information with patient's son and have him call me for questions, but were in favor of a longer-term rehab in [place] after this hospital stay.  Plan to follow for final decision, timing of medical readiness, insurance authorization, and IP Rehab bed availability.  Notified nurse case Freight forwarder and CSW.  Call if questions.    Carmelia Roller., CCC/SLP Admission Coordinator  [place]  Cell [phone]

## 2018-07-14 NOTE — Progress Notes (Signed)
Assessment/ Plan:  57 y/o manh/o cardiac arrest   1 AKI secondary to arrest.S/p CRRT and IHD, temp IJ cath >>>HD(will decide about need for a tunneled cath next week)   2 HTN- controlled 3 Anemia stable 4s/pArrest 5 H/o Substance abuse 6Enceph post arrest, improving  Subjective: Interval History: Stayed awake most of night according to sister, very sleepy this AM but arouses.  Objective: Vital signs in last 24 hours: Temp:  [98.5 F (36.9 C)-99.4 F (37.4 C)] 99.1 F (37.3 C) (07/18 1212) Pulse Rate:  [72-77] 77 (07/18 1212) Resp:  [18] 18 (07/18 0445) BP: (128-143)/(83-102) 143/102 (07/18 1212) SpO2:  [97 %-100 %] 100 % (07/18 0445) Weight:  [89.6 kg (197 lb 8.5 oz)] 89.6 kg (197 lb 8.5 oz) (07/18 0500) Weight change: -1.9 kg (-4 lb 3 oz)  Intake/Output from previous day: 07/17 0701 - 07/18 0700 In: 540 [P.O.:540] Out: 200 [Urine:200] Intake/Output this shift: No intake/output data recorded.  General appearance: slowed mentation Neck: no adenopathy, no carotid bruit, no JVD, supple, symmetrical, trachea midline and thyroid not enlarged, symmetric, no tenderness/mass/nodules, right IJ temp HD cath Resp: clear to auscultation bilaterally Chest wall: no tenderness Cardio: regular rate and rhythm, S1, S2 normal, no murmur, click, rub or gallop Extremities: extremities normal, atraumatic, no cyanosis or edema--wearing mittens with some uncontrolled movements which appear better  Lab Results: Recent Labs    07/13/18 0130 07/13/18 1408 07/14/18 0434  WBC 9.0  --  8.0  HGB 8.0* 9.0* 8.7*  HCT 25.3* 28.8* 27.8*  PLT 376  --  445*   BMET:  Recent Labs    07/13/18 0130 07/14/18 0434  NA 142 142  K 5.3* 5.2*  CL 106 101  CO2 23 27  GLUCOSE 105* 98  BUN 101* 71*  CREATININE 14.20* 11.44*  CALCIUM 8.0* 8.1*   No results for input(s): PTH in the last 72 hours. Iron Studies:  Recent Labs    07/14/18 0434  IRON 36*  TIBC 267  FERRITIN 160    Studies/Results: No results found.  Scheduled: . chlorhexidine  15 mL Mouth Rinse BID  . Chlorhexidine Gluconate Cloth  6 each Topical Q0600  . Chlorhexidine Gluconate Cloth  6 each Topical Q0600  . feeding supplement (NEPRO CARB STEADY)  237 mL Oral BID BM  . heparin injection (subcutaneous)  5,000 Units Subcutaneous Q8H  . pantoprazole  40 mg Oral Q1200  . tamsulosin  0.4 mg Oral Daily     LOS: 14 days   [name] 07/14/2018,12:58 PM

## 2018-07-14 NOTE — Progress Notes (Signed)
Occupational Therapy Treatment Patient Details Name: Christopher KaufmannShelton M Dominguez MRN: [id number] DOB: 1961-03-30 Today's Date: 07/14/2018    History of present illness Pt is a 57 y.o. 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 CT 7/4 showed bilateral occipital edema and R frontoparietal edema, question anoxic brain injury or posterior reversible encephalopathy syndrome; no hemorrhage or midline shift. CT 7/5 showed no acute intracranial abnormality. ETT 7/4-7/10. Acute renal failure; CRRT stopped 7/12. PMH includes depression.   OT comments  Pt is making progress toward goals, but continues to be limited by impaired balance as well as significant cognitive deficits.  He is oriented to self and to hospital.  He is unable to state his birthday.  He requires mod - max A to sustain his attention to simple ADL tasks such as brushing teeth and requires mod cues/Assist to sequence tasks.  He is highly impulsive and demonstrates poor self awareness.  Family will need extensive education and training on how to address/manage pt deficits as his sister was present only briefly for session, and was very reactionary, and easily upset at his behaviors.   Feel CIR is safest option for him as I am unsure family can safely transport him at present time. Will follow.   Follow Up Recommendations  CIR    Equipment Recommendations       Recommendations for Other Services      Precautions / Restrictions Precautions Precautions: Fall       Mobility Bed Mobility Overal bed mobility: Needs Assistance Bed Mobility: Supine to Sit     Supine to sit: Min assist     General bed mobility comments: cues with assist to initiate transfer with moving leg off of bed  Transfers Overall transfer level: Needs assistance   Transfers: Sit to/from Stand Sit to Stand: Min assist;+2 safety/equipment         General transfer comment: min A to rise with +2  for safety x 2 trials from bed     Balance Overall balance assessment: Needs assistance Sitting-balance support: Feet supported;No upper extremity supported Sitting balance-Leahy Scale: Fair Sitting balance - Comments: pt able to sit EOB with tendency for propping on forearms, minguard-min assist for safety and balance Postural control: Right lateral lean   Standing balance-Leahy Scale: Poor Standing balance comment: bil UE support with assist for balance, right lean in standing                           ADL either performed or assessed with clinical judgement   ADL Overall ADL's : Needs assistance/impaired     Grooming: Wash/dry hands;Wash/dry face;Oral care;Moderate assistance;Standing Grooming Details (indicate cue type and reason): assist and mod - max cues to sequence and for thoroughness.  Assistance also for balance in standing              Lower Body Dressing: Moderate assistance;Sit to/from stand Lower Body Dressing Details (indicate cue type and reason): Pt highly impulsive.  He was able to don Rt sock, but required assist to initiate donning Lt socks over toes.  He donned Rt sock, then could not figure out what to do with the Lt sock despite multiple questioning cues.  Requires mod A for standing balance  Toilet Transfer: Moderate assistance;+2 for physical assistance;Ambulation;Cueing for sequencing   Toileting- Clothing Manipulation and Hygiene: Moderate assistance;+2 for physical assistance       Functional mobility during ADLs: Moderate  assistance;+2 for physical assistance;Cueing for sequencing       Vision       Perception     Praxis      Cognition Arousal/Alertness: Awake/alert Behavior During Therapy: Impulsive;Restless Overall Cognitive Status: Impaired/Different from baseline Area of Impairment: Orientation;Attention;Memory;Following commands;Safety/judgement;Problem solving                 Orientation Level: Disoriented  to;Person;Place;Time;Situation Current Attention Level: Sustained(with moderate- max cues ) Memory: Decreased recall of precautions;Decreased short-term memory Following Commands: Follows one step commands inconsistently Safety/Judgement: Decreased awareness of safety;Decreased awareness of deficits   Problem Solving: Slow processing;Decreased initiation;Difficulty sequencing;Requires verbal cues;Requires tactile cues General Comments: Pt unable to state his birthdate.  He is aware he is in the hospital, but otherwise, not oriented to situation, or time.  He is very easily distracted, and highly impulsive.  pt with decreased attention, safety and disoriented. PT following single step commands with increased time and redirection        Exercises     Shoulder Instructions       General Comments sister, Berline Lopes present for first part of session, then left.  She frequently attempted to jump in and correct pt behaviors becoming upset with pt when he was inappropriate or refusing an activity.  Attempted to cue her, but she then excused herself from session     Pertinent Vitals/ Pain       Pain Assessment: Faces Faces Pain Scale: No hurt  Home Living                                          Prior Functioning/Environment              Frequency  Min 2X/week        Progress Toward Goals  OT Goals(current goals can now be found in the care plan section)  Progress towards OT goals: Progressing toward goals  Acute Rehab OT Goals Patient Stated Goal: Pt unable to participate in goal setting  Time For Goal Achievement: 07/25/18 Potential to Achieve Goals: Good ADL Goals Pt Will Perform Grooming: with min assist;standing Pt Will Perform Upper Body Bathing: with supervision;sitting Pt Will Perform Lower Body Bathing: with min assist;sit to/from stand Additional ADL Goal #1: Pt will be oriented x 4 with min cues and use of external cues Additional ADL Goal #2: Pt  will attend to simple ADL task x 2 mins with no more than 3 cues.  Plan Discharge plan remains appropriate    Co-evaluation    PT/OT/SLP Co-Evaluation/Treatment: Yes Reason for Co-Treatment: Complexity of the patient's impairments (multi-system involvement);Necessary to address cognition/behavior during functional activity;For patient/therapist safety;To address functional/ADL transfers   OT goals addressed during session: ADL's and self-care      AM-PAC PT "6 Clicks" Daily Activity     Outcome Measure   Help from another person eating meals?: A Little Help from another person taking care of personal grooming?: A Lot Help from another person toileting, which includes using toliet, bedpan, or urinal?: A Lot Help from another person bathing (including washing, rinsing, drying)?: A Lot Help from another person to put on and taking off regular upper body clothing?: A Lot Help from another person to put on and taking off regular lower body clothing?: A Lot 6 Click Score: 13    End of Session Equipment Utilized During Treatment: Gait belt  OT Visit  Diagnosis: Unsteadiness on feet (R26.81);Other abnormalities of gait and mobility (R26.89);Muscle weakness (generalized) (M62.81);Cognitive communication deficit (R41.841)   Activity Tolerance Patient tolerated treatment well   Patient Left in chair;with call bell/phone within reach;with family/visitor present;with restraints reapplied;with chair alarm set   Nurse Communication Mobility status        Time: 1478-2956 OT Time Calculation (min): 36 min  Charges: OT General Charges $OT Visit: 1 Visit OT Treatments $Self Care/Home Management : 8-22 mins  [employer], OTR/L [phone]    Christopher Dominguez M 07/14/2018, 3:49 PM

## 2018-07-14 NOTE — Progress Notes (Signed)
Physical Therapy Treatment Patient Details Name: Christopher Dominguez MRN: [id number] DOB: 02/26/1961 Today's Date: 07/14/2018    History of Present Illness Pt is a 57 y.o. 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 CT 7/4 showed bilateral occipital edema and R frontoparietal edema, question anoxic brain injury or posterior reversible encephalopathy syndrome; no hemorrhage or midline shift. CT 7/5 showed no acute intracranial abnormality. ETT 7/4-7/10. Acute renal failure; CRRT stopped 7/12. PMH includes depression.    PT Comments    Pt pleasant with decreased inappropriate behavior today and continues to need cues and direction to task as well as increased time and assist for all aspects of mobility. Pt able to don/doff socks sitting EOB x 2 trials today with guarding and redirection- See OT note. Pt with improved gait and transfers but remains severely limited by cognition and safety awareness. Sister Berline Lopes present at beginning of session and educated for need to only have comments from therapist. Sister removed herself after she found this to be difficult. Sister present end of session and educated for brain injury symptoms and management, she stated appreciation.     Follow Up Recommendations  CIR;Supervision/Assistance - 24 hour     Equipment Recommendations  Rolling walker with 5" wheels    Recommendations for Other Services OT consult;Speech consult;Rehab consult     Precautions / Restrictions Precautions Precautions: Fall    Mobility  Bed Mobility Overal bed mobility: Needs Assistance Bed Mobility: Supine to Sit     Supine to sit: Min assist     General bed mobility comments: cues with assist to initiate transfer with moving leg off of bed  Transfers Overall transfer level: Needs assistance   Transfers: Sit to/from Stand Sit to Stand: Min assist;+2 safety/equipment         General transfer  comment: min A to rise with +2 for safety x 2 trials from bed   Ambulation/Gait Ambulation/Gait assistance: Mod assist;+2 physical assistance Gait Distance (Feet): 200 Feet Assistive device: 2 person hand held assist Gait Pattern/deviations: Scissoring;Drifts right/left;Narrow base of support   Gait velocity interpretation: 1.31 - 2.62 ft/sec, indicative of limited community ambulator General Gait Details: pt with right trunk lean, increased BOS from prior gait but continues to have scissoring, decreased safety and gait. Pt able to perform turns with physical assist and verbal cues   Stairs             Wheelchair Mobility    Modified Rankin (Stroke Patients Only)       Balance Overall balance assessment: Needs assistance Sitting-balance support: Feet supported;No upper extremity supported Sitting balance-Leahy Scale: Fair Sitting balance - Comments: pt able to sit EOB with tendency for propping on forearms, minguard-min assist for safety and balance Postural control: Right lateral lean   Standing balance-Leahy Scale: Poor Standing balance comment: bil UE support with assist for balance, right lean in standing                            Cognition Arousal/Alertness: Awake/alert Behavior During Therapy: Impulsive;Restless Overall Cognitive Status: Impaired/Different from baseline Area of Impairment: Orientation;Attention;Memory;Following commands;Safety/judgement;Problem solving                 Orientation Level: Disoriented to;Person;Place;Time;Situation Current Attention Level: Sustained Memory: Decreased recall of precautions;Decreased short-term memory Following Commands: Follows one step commands inconsistently Safety/Judgement: Decreased awareness of safety;Decreased awareness of deficits   Problem Solving: Slow processing;Decreased  initiation;Difficulty sequencing;Requires verbal cues;Requires tactile cues General Comments: pt with decreased  attention, safety and disoriented. PT following single step commands with increased time and redirection      Exercises      General Comments        Pertinent Vitals/Pain Pain Assessment: No/denies pain    Home Living                      Prior Function            PT Goals (current goals can now be found in the care plan section) Progress towards PT goals: Progressing toward goals    Frequency           PT Plan Current plan remains appropriate    Co-evaluation              AM-PAC PT "6 Clicks" Daily Activity  Outcome Measure  Difficulty turning over in bed (including adjusting bedclothes, sheets and blankets)?: A Little Difficulty moving from lying on back to sitting on the side of the bed? : A Little Difficulty sitting down on and standing up from a chair with arms (e.g., wheelchair, bedside commode, etc,.)?: Unable Help needed moving to and from a bed to chair (including a wheelchair)?: A Lot Help needed walking in hospital room?: A Lot Help needed climbing 3-5 steps with a railing? : A Lot 6 Click Score: 13    End of Session Equipment Utilized During Treatment: Gait belt Activity Tolerance: Patient tolerated treatment well Patient left: in chair;with call bell/phone within reach;with chair alarm set Nurse Communication: Mobility status PT Visit Diagnosis: Other abnormalities of gait and mobility (R26.89);Other symptoms and signs involving the nervous system (R29.898);Unsteadiness on feet (R26.81)     Time: [id number] PT Time Calculation (min) (ACUTE ONLY): 36 min  Charges:  $Gait Training: 8-22 mins                    G Codes:       Delaney MeigsMaija Tabor Angella Montas, PT [phone]    Enedina FinnerMaija B Deano Tomaszewski 07/14/2018, 12:35 PM

## 2018-07-14 NOTE — Progress Notes (Addendum)
Subjective:  Denies SSCP, palpitations or Dyspnea Seems to have some anoxic brain injury Has mits on to keep from pulling central catheter out  Objective:  Vitals:   07/14/18 0443 07/14/18 0445 07/14/18 0500 07/14/18 1212  BP: 139/83 139/83  (!) 143/102  Pulse: 72 72  77  Resp:  18    Temp: 98.9 F (37.2 C) 98.9 F (37.2 C)  99.1 F (37.3 C)  TempSrc: Oral Oral  Oral  SpO2:  100%    Weight:   197 lb 8.5 oz (89.6 kg)   Height:        Intake/Output from previous day:  Intake/Output Summary (Last 24 hours) at 07/14/2018 1358 Last data filed at 07/14/2018 1331 Gross per 24 hour  Intake 180 ml  Output 600 ml  Net -420 ml    Physical Exam: Anoxic brain injury but pleasant  Healthy:  appears stated age HEENT: Right IJ dialysis catheter  Neck supple with no adenopathy JVP normal no bruits no thyromegaly Lungs clear with no wheezing and good diaphragmatic motion Heart:  S1/S2 no murmur, no rub, gallop or click PMI normal Abdomen: benighn, BS positve, no tenderness, no AAA no bruit.  No HSM or HJR Distal pulses intact with no bruits No edema Neuro non-focal Skin warm and dry No muscular weakness   Lab Results: Basic Metabolic Panel: Recent Labs    07/13/18 0130 07/14/18 0434  NA 142 142  K 5.3* 5.2*  CL 106 101  CO2 23 27  GLUCOSE 105* 98  BUN 101* 71*  CREATININE 14.20* 11.44*  CALCIUM 8.0* 8.1*  MG 3.0*  3.1* 2.4  PHOS 6.9* 5.8*   Liver Function Tests: Recent Labs    07/13/18 0130 07/14/18 0434  AST  --  21  ALT  --  39  ALKPHOS  --  58  BILITOT  --  0.4  PROT  --  6.2*  ALBUMIN 2.3* 2.5*  2.4*   No results for input(s): LIPASE, AMYLASE in the last 72 hours. CBC: Recent Labs    07/13/18 0130 07/13/18 1408 07/14/18 0434  WBC 9.0  --  8.0  HGB 8.0* 9.0* 8.7*  HCT 25.3* 28.8* 27.8*  MCV 71.3*  --  71.1*  PLT 376  --  445*    Anemia Panel: Recent Labs    07/14/18 0434  VITAMINB12 782  FOLATE 10.6  FERRITIN 160  TIBC 267    IRON 36*    Imaging: No results found.  Cardiac Studies:  ECG: SR LVH normal no infarct or acute ST changes    Telemetry:  NSR no arrhythmia  Echo: 07/04/18 EF 55-60%   Medications:   . chlorhexidine  15 mL Mouth Rinse BID  . Chlorhexidine Gluconate Cloth  6 each Topical Q0600  . Chlorhexidine Gluconate Cloth  6 each Topical Q0600  . [START ON 07/15/2018] Chlorhexidine Gluconate Cloth  6 each Topical Q0600  . feeding supplement (NEPRO CARB STEADY)  237 mL Oral BID BM  . heparin injection (subcutaneous)  5,000 Units Subcutaneous Q8H  . pantoprazole  40 mg Oral Q1200  . tamsulosin  0.4 mg Oral Daily     . sodium chloride 10 mL/hr at 07/07/18 0100  . sodium chloride    . sodium chloride      Assessment/Plan:   Sudden Death:  Etiology no clear but likely precipitated by THC/cocaine with positive drug screen Initial toxic Cardiomyopathy now normal EF ECG ok Troponin only 1.36  Discussed options with patient  and family in room Favor lexiscan myovue in am to risk stratify for ischemia and avoid cath if possible given acute renal injury requiring Dialysis and anoxic brain injury   Direct patient care time and reviewing hospital course TTE x 2 ECG;s labs and notes as well as discussing options with patient and family and exam  30 minutes   Charlton Hawseter Saundra Gin 07/14/2018, 1:58 PM

## 2018-07-14 NOTE — Progress Notes (Signed)
PROGRESS NOTE    Georga KaufmannShelton M Mckeag  [name]:[id number] DOB: [birth year] DOA: 06/30/2018 PCP: Patient, No Pcp Per   Brief Narrative:  57 yo with no significant past medical history, [place] to critical care on 7/4 following [place]y Leeth cardiac arrest. -Hospitalization complicated by acute kidney injury requiring hemodialysis, chorea post extubation, encephalopathy from anoxic brain injury, cardiomyopathy with EF down to 25% which recovered to 55% range in 3 days on repeat echo. 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 - EF was 25-30% on admission which improved to 55-60% without evidence of any current ongoing motion abnormality -Significantly elevated troponin on admission felt to be due to CPR and cardiac arrest and not ACS -Seen by cardiology this admission -Plan for ischemia evaluation - c/s cardiology today regarding this, plan for lexiscan myovue tomorrow AM    Acute respiratory failure with hypoxemia (HCC) - following cardiac arrest - Extubated 7/10 - Also treated for aspiration pneumonia, completed antibiotics - Continue pulmonary toilet, incentive spirometer, nebs as needed - Currently doing well on room air   Encephalopathy  anoxic brain injury  Post Hypoxic Chroea -seen by neurology, noted to have waxing and waning mental status (Mechille Varghese&Ox1 this morning - pt apparently up late last night with sister)  - delirium precautions -Haldol when necessary (recommended not continuing this long term), gabapentin could be tried as an alternative if movements are interfering with  rehab and less sedating alternative needed (gabapentin 300 mg BID -Will need long-term rehabilitation and outpatient neurology follow-up (if persistent deficits and abnormal movements after rehab) -SLP following on dysphagia 2 diet - Therapy recommending CIR, will follow  Acute renal failure -Due to cardiac arrest, shock -Renal following, was getting CRRT - intermittent HD now, has right temporary IJ HD catheter - Last dialysis yesterday - 400 cc UOP today - Renal planning to make decision about tunneled dialysis catheter next week  Hematuria/enlarged prostate -Seen by urology -Given very large prostate recommended to avoid Foley unless residuals greater than 500 -continue Flomax   Transient rectal bleeding -Resolved, seen by GI, supportive care for now -Subcutaneous heparin held -> now resumed, continue SCDs for DVT prophylaxis   shock liver -Transaminitis improved -Trend LFTs  Anemia: stable.  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  Will start PO iron.   DVT prophylaxis: heparin Code Status: full  Family Communication: sister at bedside Disposition Plan: pending.  ?inpatient rehab.  Pending further w/u.    Consultants:   Renal  Cards  GI  Urology  Procedures:   R IJ HD catheter CRRT followed by IHD  07/04/18 Study Conclusions  - Left ventricle: The cavity size was normal. Wall thickness was   normal. Systolic function was normal. The estimated ejection   fraction was in the range of 55% to 60%. Wall motion was normal;   there were no regional wall motion abnormalities. - Aortic root: The aortic root was mildly dilated. - Right ventricle: The cavity size was mildly dilated.  Impressions:  - Normal LV function; mildly dilated aortic root; mild  RVE.  07/01/18 Study Conclusions  - Left ventricle: The cavity size was normal. Wall thickness was   increased in Sascha Baugher pattern of mild LVH. Systolic function was   severely reduced. The  estimated ejection fraction was in the   range of 25% to 30%. Diffuse hypokinesis. Doppler parameters are   consistent with abnormal left ventricular relaxation (grade 1   diastolic dysfunction). - Aortic root: The aortic root was mildly dilated. - Mitral valve: Calcified annulus. - Right ventricle: The cavity size was mildly dilated. Systolic   function was severely reduced. - Right atrium: The atrium was mildly dilated.  Impressions:  - Technically difficult; definity used; severe LV dysfunction; mild   LVH; mild diastolic dysfunction; enlarged right side with   moderate to severe RV dysfunction.  7/5 Final Interpretation: Right: There is no evidence of deep vein thrombosis in the lower extremity. No cystic structure found in the popliteal fossa. Left: There is no evidence of deep vein thrombosis in the lower extremity. However, portions of this examination were limited- see technologist comments above. No cystic structure found in the popliteal fossa.  Antimicrobials:  Anti-infectives (From admission, onward)   Start     Dose/Rate Route Frequency Ordered Stop   07/06/18 1200  piperacillin-tazobactam (ZOSYN) IVPB 3.375 g    Note to Pharmacy:  Please stop after today   3.375 g 100 mL/hr over 30 Minutes Intravenous Every 6 hours 07/06/18 1015 07/06/18 1932   07/02/18 1800  piperacillin-tazobactam (ZOSYN) IVPB 3.375 g  Status:  Discontinued     3.375 g 12.5 mL/hr over 240 Minutes Intravenous Every 6 hours 07/02/18 1309 07/02/18 1310   07/02/18 1800  piperacillin-tazobactam (ZOSYN) IVPB 3.375 g  Status:  Discontinued     3.375 g 100 mL/hr over 30 Minutes Intravenous Every 6 hours 07/02/18 1311 07/06/18 1015   07/02/18 1200  piperacillin-tazobactam (ZOSYN) IVPB 2.25 g  Status:  Discontinued     2.25 g 100 mL/hr over 30 Minutes Intravenous Every 6 hours 07/02/18 0728 07/02/18 1309   07/01/18 0100  piperacillin-tazobactam (ZOSYN) IVPB 3.375 g  Status:  Discontinued     3.375  g 12.5 mL/hr over 240 Minutes Intravenous Every 8 hours 06/30/18 1812 07/02/18 0728   06/30/18 1815  piperacillin-tazobactam (ZOSYN) IVPB 3.375 g     3.375 g 100 mL/hr over 30 Minutes Intravenous  Once 06/30/18 1804 06/30/18 1945      Subjective: Sleepy, Jewell Haught&Ox1.   Objective: Vitals:   07/14/18 0443 07/14/18 0445 07/14/18 0500 07/14/18 1212  BP: 139/83 139/83  (!) 143/102  Pulse: 72 72  77  Resp:  18    Temp: 98.9 F (37.2 C) 98.9 F (37.2 C)  99.1 F (37.3 C)  TempSrc: Oral Oral  Oral  SpO2:  100%    Weight:   89.6 kg (197 lb 8.5 oz)   Height:        Intake/Output Summary (Last 24 hours) at 07/14/2018 1837 Last data filed at 07/14/2018 1331 Gross per 24 hour  Intake 720 ml  Output 400 ml  Net 320 ml   Filed Weights   07/13/18 0047 07/13/18 0441 07/14/18 0500  Weight: 91.5 kg (201 lb 11.5 oz) 90.5 kg (199 lb 8.3 oz) 89.6 kg (197 lb 8.5 oz)    Examination:  General: No acute distress. Cardiovascular: Heart sounds show Kyheem Bathgate regular rate, and rhythm. No gallops or rubs. No murmurs. No JVD. Lungs: Clear to auscultation bilaterally with good air movement. No rales, rhonchi or wheezes. Abdomen:  Soft, nontender, nondistended with normal active bowel sounds. Neurological: Alert and oriented 1.  Sleepy. Moves all extremities 4 . Cranial nerves II through XII grossly intact. Skin: Warm and dry. No rashes or lesions. Extremities: No clubbing or cyanosis. No edema. Psychiatric: Mood and affect are normal. Insight and judgment are appropriate. R IJ cath.   Data Reviewed: I have personally reviewed following labs and imaging studies  CBC: Recent Labs  Lab 07/08/18 1541 07/08/18 2146 07/09/18 0342 07/10/18 0240 07/11/18 0301 07/13/18 0130 07/13/18 1408 07/14/18 0434  WBC 14.6* 12.5* 11.5* 12.0* 10.7* 9.0  --  8.0  NEUTROABS 9.7* 8.3* 7.1 8.3* 7.9*  --   --   --   HGB 10.8* 9.6* 9.2* 9.6* 8.8* 8.0* 9.0* 8.7*  HCT 35.1* 30.8* 30.2* 31.4* 28.2* 25.3* 28.8* 27.8*  MCV  71.2* 71.1* 71.6* 71.7* 70.1* 71.3*  --  71.1*  PLT 197 186 192 233 282 376  --  445*   Basic Metabolic Panel: Recent Labs  Lab 07/10/18 0240 07/11/18 0301 07/12/18 0732 07/13/18 0130 07/14/18 0434  NA 142  142 143 142 142 142  K 4.2  4.3 4.2 4.9 5.3* 5.2*  CL 110  110 106 106 106 101  CO2 21*  20* 27 24 23 27   GLUCOSE 110*  108* 110* 103* 105* 98  BUN 78*  76* 55* 81* 101* 71*  CREATININE 8.81*  8.82* 8.46* 12.58* 14.20* 11.44*  CALCIUM 8.2*  8.4* 8.0* 8.3* 8.0* 8.1*  MG 3.5* 2.6* 2.9* 3.0*  3.1* 2.4  PHOS 5.9* 4.5 6.8* 6.9* 5.8*   GFR: Estimated Creatinine Clearance: 7.5 mL/min (Margurette Brener) (by C-G formula based on SCr of 11.44 mg/dL (H)). Liver Function Tests: Recent Labs  Lab 07/10/18 0240 07/11/18 0301 07/12/18 0732 07/13/18 0130 07/14/18 0434  AST  --   --   --   --  21  ALT  --   --   --   --  39  ALKPHOS  --   --   --   --  58  BILITOT  --   --   --   --  0.4  PROT  --   --   --   --  6.2*  ALBUMIN 2.5* 2.3* 2.4* 2.3* 2.5*  2.4*   No results for input(s): LIPASE, AMYLASE in the last 168 hours. No results for input(s): AMMONIA in the last 168 hours. Coagulation Profile: No results for input(s): INR, PROTIME in the last 168 hours. Cardiac Enzymes: No results for input(s): CKTOTAL, CKMB, CKMBINDEX, TROPONINI in the last 168 hours. BNP (last 3 results) No results for input(s): PROBNP in the last 8760 hours. HbA1C: No results for input(s): HGBA1C in the last 72 hours. CBG: Recent Labs  Lab 07/14/18 0001 07/14/18 0439 07/14/18 0743 07/14/18 1208 07/14/18 1546  GLUCAP 108* 92 87 127* 114*  114*   Lipid Profile: No results for input(s): CHOL, HDL, LDLCALC, TRIG, CHOLHDL, LDLDIRECT in the last 72 hours. Thyroid Function Tests: No results for input(s): TSH, T4TOTAL, FREET4, T3FREE, THYROIDAB in the last 72 hours. Anemia Panel: Recent Labs    07/14/18 0434  VITAMINB12 782  FOLATE 10.6  FERRITIN 160  TIBC 267  IRON 36*   Sepsis Labs: No results  for input(s): PROCALCITON, LATICACIDVEN in the last 168 hours.  No results found for this or any previous visit (from the past 240 hour(s)).       Radiology Studies: No results found.      Scheduled Meds: .  chlorhexidine  15 mL Mouth Rinse BID  . Chlorhexidine Gluconate Cloth  6 each Topical Q0600  . Chlorhexidine Gluconate Cloth  6 each Topical Q0600  . [START ON 07/15/2018] Chlorhexidine Gluconate Cloth  6 each Topical Q0600  . feeding supplement (NEPRO CARB STEADY)  237 mL Oral BID BM  . heparin injection (subcutaneous)  5,000 Units Subcutaneous Q8H  . pantoprazole  40 mg Oral Q1200  . tamsulosin  0.4 mg Oral Daily   Continuous Infusions: . sodium chloride 10 mL/hr at 07/07/18 0100  . sodium chloride    . sodium chloride       LOS: 14 days    Time spent: over 30 min    Lacretia Nicks, MD [facility] Pager [phone]  If 7PM-7AM, please contact night-coverage [email or web address] Password [facility] 07/14/2018, 6:37 PM

## 2018-07-14 NOTE — Care Management Important Message (Signed)
Important Message  Patient Details  Name: Christopher Dominguez MRN: [id number] Date of Birth: 1961-03-18   Medicare Important Message Given:  Yes  Patient unable to sign, IM given to patients sister at bedside.  Christopher Dominguez P [name] 07/14/2018, 1:33 PM

## 2018-07-15 ENCOUNTER — Inpatient Hospital Stay (HOSPITAL_COMMUNITY): Payer: Medicare Other

## 2018-07-15 DIAGNOSIS — R079 Chest pain, unspecified: Secondary | ICD-10-CM

## 2018-07-15 LAB — NM MYOCAR MULTI W/SPECT W/WALL MOTION / EF
CHL CUP RESTING HR STRESS: 72 {beats}/min
CSEPPHR: 96 {beats}/min

## 2018-07-15 LAB — CBC
HCT: 27.8 % — ABNORMAL LOW (ref 39.0–52.0)
Hemoglobin: 8.5 g/dL — ABNORMAL LOW (ref 13.0–17.0)
MCH: 21.9 pg — AB (ref 26.0–34.0)
MCHC: 30.6 g/dL (ref 30.0–36.0)
MCV: 71.5 fL — AB (ref 78.0–100.0)
PLATELETS: 480 10*3/uL — AB (ref 150–400)
RBC: 3.89 MIL/uL — ABNORMAL LOW (ref 4.22–5.81)
RDW: 15.4 % (ref 11.5–15.5)
WBC: 7.6 10*3/uL (ref 4.0–10.5)

## 2018-07-15 LAB — RENAL FUNCTION PANEL
ANION GAP: 16 — AB (ref 5–15)
Albumin: 2.5 g/dL — ABNORMAL LOW (ref 3.5–5.0)
BUN: 86 mg/dL — ABNORMAL HIGH (ref 6–20)
CALCIUM: 8.3 mg/dL — AB (ref 8.9–10.3)
CO2: 27 mmol/L (ref 22–32)
CREATININE: 14.16 mg/dL — AB (ref 0.61–1.24)
Chloride: 99 mmol/L (ref 98–111)
GFR, EST AFRICAN AMERICAN: 4 mL/min — AB (ref >60)
GFR, EST NON AFRICAN AMERICAN: 3 mL/min — AB (ref >60)
Glucose, Bld: 94 mg/dL (ref 70–99)
Phosphorus: 6.4 mg/dL — ABNORMAL HIGH (ref 2.5–4.6)
Potassium: 5.5 mmol/L — ABNORMAL HIGH (ref 3.5–5.1)
SODIUM: 142 mmol/L (ref 135–145)

## 2018-07-15 LAB — GLUCOSE, CAPILLARY
GLUCOSE-CAPILLARY: 99 mg/dL (ref 70–99)
GLUCOSE-CAPILLARY: 91 mg/dL (ref 70–99)
GLUCOSE-CAPILLARY: 126 mg/dL — AB (ref 70–99)
Glucose-Capillary: 105 mg/dL — ABNORMAL HIGH (ref 70–99)
Glucose-Capillary: 114 mg/dL — ABNORMAL HIGH (ref 70–99)
Glucose-Capillary: 97 mg/dL (ref 70–99)

## 2018-07-15 LAB — URINALYSIS, ROUTINE W REFLEX MICROSCOPIC
Bilirubin Urine: NEGATIVE
GLUCOSE, UA: NEGATIVE mg/dL
Ketones, ur: NEGATIVE mg/dL
Nitrite: NEGATIVE
PH: 9 — AB (ref 5.0–8.0)
Protein, ur: 30 mg/dL — AB
Specific Gravity, Urine: 1.006 (ref 1.005–1.030)

## 2018-07-15 LAB — MAGNESIUM: Magnesium: 2.6 mg/dL — ABNORMAL HIGH (ref 1.7–2.4)

## 2018-07-15 MED ORDER — REGADENOSON 0.4 MG/5ML IV SOLN
INTRAVENOUS | Status: AC
  Filled 2018-07-15: qty 5

## 2018-07-15 MED ORDER — REGADENOSON 0.4 MG/5ML IV SOLN
0.4000 mg | Freq: Once | INTRAVENOUS | Status: AC
  Administered 2018-07-15: 0.4 mg via INTRAVENOUS
  Filled 2018-07-15: qty 5

## 2018-07-15 MED ORDER — TECHNETIUM TC 99M TETROFOSMIN IV KIT
30.0000 | PACK | Freq: Once | INTRAVENOUS | Status: AC | PRN
  Administered 2018-07-15: 30 via INTRAVENOUS

## 2018-07-15 MED ORDER — TECHNETIUM TC 99M TETROFOSMIN IV KIT
10.0000 | PACK | Freq: Once | INTRAVENOUS | Status: AC | PRN
  Administered 2018-07-15: 10 via INTRAVENOUS

## 2018-07-15 MED ORDER — SODIUM CHLORIDE 0.45 % IV SOLN
INTRAVENOUS | Status: DC
  Administered 2018-07-15: 1000 mL via INTRAVENOUS
  Administered 2018-07-16 – 2018-07-18 (×3): via INTRAVENOUS
  Administered 2018-07-18: 1000 mL via INTRAVENOUS
  Administered 2018-07-18: 03:00:00 via INTRAVENOUS

## 2018-07-15 NOTE — Progress Notes (Signed)
Subjective:  No chest pain   Objective:  Vitals:   07/14/18 1212 07/14/18 1958 07/15/18 0418 07/15/18 0438  BP: (!) 143/102 (!) 146/75 (!) 141/96   Pulse: 77 78 84   Resp:  18 (!) 28   Temp: 99.1 F (37.3 C) 98.8 F (37.1 C) (!) 100.7 F (38.2 C) 99.5 F (37.5 C)  TempSrc: Oral Oral Oral Oral  SpO2:   95%   Weight:   199 lb 14.4 oz (90.7 kg)   Height:        Intake/Output from previous day:  Intake/Output Summary (Last 24 hours) at 07/15/2018 0801 Last data filed at 07/15/2018 0424 Gross per 24 hour  Intake 1182 ml  Output 950 ml  Net 232 ml    Physical Exam: Anoxic brain injury but pleasant  Healthy:  appears stated age HEENT: Right IJ dialysis catheter  Neck supple with no adenopathy JVP normal no bruits no thyromegaly Lungs clear with no wheezing and good diaphragmatic motion Heart:  S1/S2 no murmur, no rub, gallop or click PMI normal Abdomen: benighn, BS positve, no tenderness, no AAA no bruit.  No HSM or HJR Distal pulses intact with no bruits No edema Neuro non-focal Skin warm and dry No muscular weakness   Lab Results: Basic Metabolic Panel: Recent Labs    07/13/18 0130 07/14/18 0434  NA 142 142  K 5.3* 5.2*  CL 106 101  CO2 23 27  GLUCOSE 105* 98  BUN 101* 71*  CREATININE 14.20* 11.44*  CALCIUM 8.0* 8.1*  MG 3.0*  3.1* 2.4  PHOS 6.9* 5.8*   Liver Function Tests: Recent Labs    07/13/18 0130 07/14/18 0434  AST  --  21  ALT  --  39  ALKPHOS  --  58  BILITOT  --  0.4  PROT  --  6.2*  ALBUMIN 2.3* 2.5*  2.4*   No results for input(s): LIPASE, AMYLASE in the last 72 hours. CBC: Recent Labs    07/13/18 0130 07/13/18 1408 07/14/18 0434  WBC 9.0  --  8.0  HGB 8.0* 9.0* 8.7*  HCT 25.3* 28.8* 27.8*  MCV 71.3*  --  71.1*  PLT 376  --  445*    Anemia Panel: Recent Labs    07/14/18 0434  VITAMINB12 782  FOLATE 10.6  FERRITIN 160  TIBC 267  IRON 36*    Imaging: No results found.  Cardiac Studies:  ECG: SR LVH  normal no infarct or acute ST changes    Telemetry:  NSR no arrhythmia  Echo: 07/04/18 EF 55-60%   Medications:   . chlorhexidine  15 mL Mouth Rinse BID  . Chlorhexidine Gluconate Cloth  6 each Topical Q0600  . Chlorhexidine Gluconate Cloth  6 each Topical Q0600  . Chlorhexidine Gluconate Cloth  6 each Topical Q0600  . feeding supplement (NEPRO CARB STEADY)  237 mL Oral BID BM  . ferrous sulfate  325 mg Oral Q breakfast  . heparin injection (subcutaneous)  5,000 Units Subcutaneous Q8H  . pantoprazole  40 mg Oral Q1200  . tamsulosin  0.4 mg Oral Daily     . sodium chloride 10 mL/hr at 07/07/18 0100  . sodium chloride    . sodium chloride      Assessment/Plan:   Sudden Death:  Etiology no clear but likely precipitated by THC/cocaine with positive drug screen Initial toxic Cardiomyopathy now normal EF ECG ok Troponin only 1.36  For myovue today Doubt ischemic nature and Would have  high threshold to recommend cardiac cath   [facility] 07/15/2018, 8:01 AM

## 2018-07-15 NOTE — Progress Notes (Signed)
PROGRESS NOTE    Christopher Dominguez  ZOX:[id number] DOB: Nov 24, 1961 DOA: 06/30/2018 PCP: Patient, No Pcp Per   Brief Narrative:  57 yo with no significant past medical history, originally from [place] admitted to critical care on 7/4 following a cardiac arrest. -Hospitalization complicated by acute kidney injury requiring hemodialysis, chorea post extubation, encephalopathy from anoxic brain injury, cardiomyopathy with EF down to 25% which recovered to 55% range in 3 days on repeat echo. 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  CXR without active cardiopulm dz.  Follow urinalysis and culture.  Will obtain blood cx if recurrent temp.  Hold off on abx for now.    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  Recommending proceeding with rehab and f/u outpatient with Dr. Cristal Deer.    Acute respiratory failure with hypoxemia (HCC) - following cardiac arrest - Extubated 7/10 - Also treated for aspiration pneumonia, completed antibiotics - Continue pulmonary toilet, incentive spirometer, nebs as needed - Currently doing well on room air   Encephalopathy  anoxic brain injury  Post Hypoxic Chroea -seen by neurology, noted to have waxing and waning mental status (again A&Ox1 this am,  confused)  - delirium precautions -Haldol when necessary (recommended not continuing this long term), gabapentin could be tried as an alternative if movements are interfering with rehab and less sedating alternative needed (gabapentin 300 mg BID -Will need long-term rehabilitation and outpatient neurology follow-up (if persistent deficits and abnormal movements after rehab) -SLP following on dysphagia 2 diet - Therapy recommending CIR, will follow  Acute renal failure -Due to cardiac arrest, shock -Renal following, was getting CRRT - intermittent HD now, has right temporary IJ HD catheter - Last dialysis yesterday - 950 cc UOP yesterday - Renal planning to make decision about tunneled dialysis catheter next week  Hematuria/enlarged prostate -Seen by urology -Given very large prostate recommended to avoid Foley unless residuals greater than 500 -continue Flomax   Transient rectal bleeding -Resolved, seen by GI, supportive care for now -Subcutaneous heparin held -> now resumed, continue SCDs for DVT prophylaxis   shock liver -Transaminitis improved -Trend LFTs  Anemia: stable.  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  Will start PO iron.   DVT prophylaxis: heparin Code Status: full  Family Communication: sister at bedside Disposition Plan: pending.  ?inpatient rehab.  Pending further w/u.    Consultants:   Renal  Cards  GI  Urology  Procedures:   R IJ HD catheter CRRT followed by IHD  07/04/18 Study Conclusions  - Left ventricle: The cavity size was normal. Wall thickness was   normal. Systolic function was normal. The estimated ejection   fraction was in the range of 55% to 60%. Wall motion was normal;   there were no  regional wall motion abnormalities. - Aortic root: The aortic root was mildly dilated. - Right ventricle: The cavity size was mildly dilated.  Impressions:  - Normal LV function; mildly dilated aortic root; mild  RVE.  07/01/18 Study Conclusions  - Left ventricle: The cavity size was normal. Wall thickness was   increased in a pattern of mild LVH. Systolic function was   severely reduced. The estimated ejection fraction was in the   range of 25% to 30%. Diffuse hypokinesis. Doppler parameters are   consistent with abnormal left ventricular relaxation (grade 1   diastolic dysfunction). - Aortic root: The aortic root was mildly dilated. - Mitral valve: Calcified annulus. - Right ventricle: The cavity size was mildly dilated. Systolic   function was severely reduced. - Right atrium: The atrium was mildly dilated.  Impressions:  - Technically difficult; definity used; severe LV dysfunction; mild   LVH; mild diastolic dysfunction; enlarged right side with   moderate to severe RV dysfunction.  7/5 Final Interpretation: Right: There is no evidence of deep vein thrombosis in the lower extremity. No cystic structure found in the popliteal fossa. Left: There is no evidence of deep vein thrombosis in the lower extremity. However, portions of this examination were limited- see technologist comments above. No cystic structure found in the popliteal fossa.  Antimicrobials:  Anti-infectives (From admission, onward)   Start     Dose/Rate Route Frequency Ordered Stop   07/06/18 1200  piperacillin-tazobactam (ZOSYN) IVPB 3.375 g    Note to Pharmacy:  Please stop after today   3.375 g 100 mL/hr over 30 Minutes Intravenous Every 6 hours 07/06/18 1015 07/06/18 1932   07/02/18 1800  piperacillin-tazobactam (ZOSYN) IVPB 3.375 g  Status:  Discontinued     3.375 g 12.5 mL/hr over 240 Minutes Intravenous Every 6 hours 07/02/18 1309 07/02/18 1310   07/02/18 1800  piperacillin-tazobactam (ZOSYN) IVPB 3.375 g  Status:  Discontinued     3.375 g 100 mL/hr over 30 Minutes Intravenous Every 6 hours 07/02/18 1311 07/06/18 1015   07/02/18 1200  piperacillin-tazobactam (ZOSYN) IVPB 2.25 g  Status:  Discontinued      2.25 g 100 mL/hr over 30 Minutes Intravenous Every 6 hours 07/02/18 0728 07/02/18 1309   07/01/18 0100  piperacillin-tazobactam (ZOSYN) IVPB 3.375 g  Status:  Discontinued     3.375 g 12.5 mL/hr over 240 Minutes Intravenous Every 8 hours 06/30/18 1812 07/02/18 0728   06/30/18 1815  piperacillin-tazobactam (ZOSYN) IVPB 3.375 g     3.375 g 100 mL/hr over 30 Minutes Intravenous  Once 06/30/18 1804 06/30/18 1945      Subjective: A&Ox1 No pain  Objective: Vitals:   07/15/18 1212 07/15/18 1248 07/15/18 1250 07/15/18 1251  BP: (!) 154/90 139/66 140/69 134/71  Pulse:      Resp:      Temp:      TempSrc:      SpO2:      Weight:      Height:        Intake/Output Summary (Last 24 hours) at 07/15/2018 1556 Last data filed at 07/15/2018 0949 Gross per 24 hour  Intake 462 ml  Output 850 ml  Net -388 ml   Filed Weights   07/13/18 0441 07/14/18 0500 07/15/18 0418  Weight: 90.5 kg (199 lb 8.3 oz) 89.6 kg (197 lb 8.5 oz) 90.7 kg (199 lb 14.4 oz)    Examination:  General: No acute distress. Cardiovascular: Heart sounds show a regular rate, and rhythm. No gallops or  rubs. No murmurs. No JVD. Lungs: Clear to auscultation bilaterally with good air movement. No rales, rhonchi or wheezes. Abdomen: Soft, nontender, nondistended Neurological: Alert and oriented 1. Moves all extremities 4. Cranial nerves II through XII grossly intact. Skin: Warm and dry. No rashes or lesions. Extremities: No clubbing or cyanosis. No edema.  Psychiatric:Insight and judgment are impaired. R IJ cath  Data Reviewed: I have personally reviewed following labs and imaging studies  CBC: Recent Labs  Lab 07/08/18 2146 07/09/18 0342 07/10/18 0240 07/11/18 0301 07/13/18 0130 07/13/18 1408 07/14/18 0434 07/15/18 0623  WBC 12.5* 11.5* 12.0* 10.7* 9.0  --  8.0 7.6  NEUTROABS 8.3* 7.1 8.3* 7.9*  --   --   --   --   HGB 9.6* 9.2* 9.6* 8.8* 8.0* 9.0* 8.7* 8.5*  HCT 30.8* 30.2* 31.4* 28.2* 25.3* 28.8* 27.8*  27.8*  MCV 71.1* 71.6* 71.7* 70.1* 71.3*  --  71.1* 71.5*  PLT 186 192 233 282 376  --  445* 480*   Basic Metabolic Panel: Recent Labs  Lab 07/11/18 0301 07/12/18 0732 07/13/18 0130 07/14/18 0434 07/15/18 0623  NA 143 142 142 142 142  K 4.2 4.9 5.3* 5.2* 5.5*  CL 106 106 106 101 99  CO2 27 24 23 27 27   GLUCOSE 110* 103* 105* 98 94  BUN 55* 81* 101* 71* 86*  CREATININE 8.46* 12.58* 14.20* 11.44* 14.16*  CALCIUM 8.0* 8.3* 8.0* 8.1* 8.3*  MG 2.6* 2.9* 3.0*  3.1* 2.4 2.6*  PHOS 4.5 6.8* 6.9* 5.8* 6.4*   GFR: Estimated Creatinine Clearance: 6.6 mL/min (A) (by C-G formula based on SCr of 14.16 mg/dL (H)). Liver Function Tests: Recent Labs  Lab 07/11/18 0301 07/12/18 0732 07/13/18 0130 07/14/18 0434 07/15/18 0623  AST  --   --   --  21  --   ALT  --   --   --  39  --   ALKPHOS  --   --   --  58  --   BILITOT  --   --   --  0.4  --   PROT  --   --   --  6.2*  --   ALBUMIN 2.3* 2.4* 2.3* 2.5*  2.4* 2.5*   No results for input(s): LIPASE, AMYLASE in the last 168 hours. No results for input(s): AMMONIA in the last 168 hours. Coagulation Profile: No results for input(s): INR, PROTIME in the last 168 hours. Cardiac Enzymes: No results for input(s): CKTOTAL, CKMB, CKMBINDEX, TROPONINI in the last 168 hours. BNP (last 3 results) No results for input(s): PROBNP in the last 8760 hours. HbA1C: No results for input(s): HGBA1C in the last 72 hours. CBG: Recent Labs  Lab 07/14/18 2023 07/15/18 0013 07/15/18 0407 07/15/18 0757 07/15/18 1449  GLUCAP 91 105* 99 91 97   Lipid Profile: No results for input(s): CHOL, HDL, LDLCALC, TRIG, CHOLHDL, LDLDIRECT in the last 72 hours. Thyroid Function Tests: No results for input(s): TSH, T4TOTAL, FREET4, T3FREE, THYROIDAB in the last 72 hours. Anemia Panel: Recent Labs    07/14/18 0434  VITAMINB12 782  FOLATE 10.6  FERRITIN 160  TIBC 267  IRON 36*   Sepsis Labs: No results for input(s): PROCALCITON, LATICACIDVEN in the last  168 hours.  No results found for this or any previous visit (from the past 240 hour(s)).       Radiology Studies: Dg Chest 2 View  Result Date: 07/15/2018 CLINICAL DATA:  Fever. EXAM: CHEST - 2 VIEW COMPARISON:  07/08/2018  FINDINGS: Right jugular central venous catheter is seen with tip overlying the SVC. No evidence of pneumothorax. Heart size is within normal limits. Both lungs are clear. No evidence of pleural effusion. IMPRESSION: Right jugular central venous catheter in appropriate position. No active cardiopulmonary disease. Electronically Signed   By: Myles Rosenthal M.D.   On: 07/15/2018 15:05   Nm Myocar Multi W/spect W/wall Motion / Ef  Result Date: 07/15/2018 CLINICAL DATA:  CHEST PAIN EXAM: MYOCARDIAL IMAGING WITH SPECT (REST AND PHARMACOLOGIC-STRESS) GATED LEFT VENTRICULAR WALL MOTION STUDY LEFT VENTRICULAR EJECTION FRACTION TECHNIQUE: Standard myocardial SPECT imaging was performed after resting intravenous injection of 10 mCi Tc-81m tetrofosmin. Subsequently, intravenous infusion of Lexiscan was performed under the supervision of the Cardiology staff. At peak effect of the drug, 30 mCi Tc-76m tetrofosmin was injected intravenously and standard myocardial SPECT imaging was performed. Quantitative gated imaging was also performed to evaluate left ventricular wall motion, and estimate left ventricular ejection fraction. COMPARISON:  None. FINDINGS: Perfusion: decreased activity of the septal and inferior walls on the stress views compared to rest imaging on all three views, compatible with inducible ischemia with pharmacologic stress. Wall Motion: grossly normal wall motion.  no lv chamber dilatation. Left Ventricular Ejection Fraction: 59 % End diastolic volume 144 ml End systolic volume 60 ml IMPRESSION: 1. Positive exam for septal and inferior wall inducible ischemia with pharmacologic stress. 2. Grossly normal wall motion. 3. Left ventricular ejection fraction 59% 4. Non invasive risk  stratification*: Intermediate *2012 Appropriate Use Criteria for Coronary Revascularization Focused Update: J Am Coll Cardiol. 2012;59(9):857-881. http://content.dementiazones.com.aspx?articleid=[email or web address] Electronically Signed   By: Judie Petit.  Shick M.D.   On: 07/15/2018 15:19        Scheduled Meds: . chlorhexidine  15 mL Mouth Rinse BID  . Chlorhexidine Gluconate Cloth  6 each Topical Q0600  . Chlorhexidine Gluconate Cloth  6 each Topical Q0600  . Chlorhexidine Gluconate Cloth  6 each Topical Q0600  . feeding supplement (NEPRO CARB STEADY)  237 mL Oral BID BM  . ferrous sulfate  325 mg Oral Q breakfast  . heparin injection (subcutaneous)  5,000 Units Subcutaneous Q8H  . pantoprazole  40 mg Oral Q1200  . regadenoson      . tamsulosin  0.4 mg Oral Daily   Continuous Infusions: . sodium chloride 10 mL/hr at 07/07/18 0100  . sodium chloride    . sodium chloride       LOS: 15 days    Time spent: over 30 min    Lacretia Nicks, MD [facility] Pager [phone]  If 7PM-7AM, please contact night-coverage [email or web address] Password [facility] 07/15/2018, 3:56 PM

## 2018-07-15 NOTE — Progress Notes (Signed)
Reviewed myovue Disagree with Radiology interpretation On gray scale imaging with no thresshold Images look normal SDS only 3  Higher counts in general on resting images EF normal 59% Given clinical history and anoxic brain injury  Would proceed with rehab and have outpatient F/u with Dr Cristal Deerhristopher  ECG normal no chest pain and "arrest" thought to be from positive Drug screen cocaine and THC  Charlton HawsPeter Deborrah Mabin

## 2018-07-15 NOTE — Progress Notes (Signed)
[name] temp is elevated, paged provider on call for Tylenol order but has not received any response

## 2018-07-15 NOTE — Progress Notes (Signed)
    Patient presented for Lexiscan nuclear stress test. Tolerated procedure well. Pending final stress imaging result.  Berton BonJanine Tiare Rohlman, AGNP-C 07/15/2018  12:54 PM Pager: [phone]

## 2018-07-15 NOTE — Progress Notes (Signed)
Inpatient Rehabilitation  Attempted to meet with patient and sister to further discuss post acute rehab; however, patient out of room at procedure and sister not present.  As a result, I called son, Sharmon LeydenShelton Satcher at [phone] to clarify of they discussed options with him.  He clearly stated that he [facility] his [facility] [place].  He wants long-term rehab and for his ad to be near him.  He sated that he would be here Sunday and available by phone if needed.  Notified CSW and will sign off at this time.  Call if questions.   Charlane FerrettiMelissa Shanyn Preisler, M.A., CCC/SLP Admission Coordinator  [facility]  Cell [phone]

## 2018-07-15 NOTE — Progress Notes (Signed)
Assessment/ Plan:  57 y/o manh/o cardiac arrest   1 Nonoliguric--AKI secondary to arrest.S/p CRRT and IHD, temp IJ cath >>>HD prn(will decide about need for a tunneled cath next week).  Making urine now so will hydrate with 1/2NS and decide about HD Saturday 2 HTN- controlled 3 Anemia stable 4s/pArrest 5H/oSubstance abuse 6Encephpost arrest, improving  Subjective: Interval History: Increased UOP  Objective: Vital signs in last 24 hours: Temp:  [98.8 F (37.1 C)-100.7 F (38.2 C)] 99.5 F (37.5 C) (07/19 0438) Pulse Rate:  [77-84] 84 (07/19 0418) Resp:  [18-28] 28 (07/19 0418) BP: (141-146)/(75-102) 141/96 (07/19 0418) SpO2:  [95 %] 95 % (07/19 0418) Weight:  [90.7 kg (199 lb 14.4 oz)] 90.7 kg (199 lb 14.4 oz) (07/19 0418) Weight change: 1.074 kg (2 lb 5.9 oz)  Intake/Output from previous day: 07/18 0701 - 07/19 0700 In: 1182 [P.O.:1182] Out: 950 [Urine:950] Intake/Output this shift: Total I/O In: 0  Out: 300 [Urine:300]  General appearance: alert Resp: clear to auscultation bilaterally Chest wall: no tenderness Cardio: regular rate and rhythm, S1, S2 normal, no murmur, click, rub or gallop Extremities: extremities normal, atraumatic, no cyanosis or edema  Lab Results: Recent Labs    07/14/18 0434 07/15/18 0623  WBC 8.0 7.6  HGB 8.7* 8.5*  HCT 27.8* 27.8*  PLT 445* 480*   BMET:  Recent Labs    07/14/18 0434 07/15/18 0623  NA 142 142  K 5.2* 5.5*  CL 101 99  CO2 27 27  GLUCOSE 98 94  BUN 71* 86*  CREATININE 11.44* 14.16*  CALCIUM 8.1* 8.3*   No results for input(s): PTH in the last 72 hours. Iron Studies:  Recent Labs    07/14/18 0434  IRON 36*  TIBC 267  FERRITIN 160   Studies/Results: No results found.  Scheduled: . chlorhexidine  15 mL Mouth Rinse BID  . Chlorhexidine Gluconate Cloth  6 each Topical Q0600  . Chlorhexidine Gluconate Cloth  6 each Topical Q0600  . Chlorhexidine Gluconate Cloth  6 each Topical Q0600  .  feeding supplement (NEPRO CARB STEADY)  237 mL Oral BID BM  . ferrous sulfate  325 mg Oral Q breakfast  . heparin injection (subcutaneous)  5,000 Units Subcutaneous Q8H  . pantoprazole  40 mg Oral Q1200  . tamsulosin  0.4 mg Oral Daily     LOS: 15 days   Lauris PoagAlvin C Kery Batzel 07/15/2018,11:55 AM

## 2018-07-16 LAB — GLUCOSE, CAPILLARY
GLUCOSE-CAPILLARY: 100 mg/dL — AB (ref 70–99)
GLUCOSE-CAPILLARY: 108 mg/dL — AB (ref 70–99)
GLUCOSE-CAPILLARY: 125 mg/dL — AB (ref 70–99)
GLUCOSE-CAPILLARY: 92 mg/dL (ref 70–99)
GLUCOSE-CAPILLARY: 99 mg/dL (ref 70–99)
Glucose-Capillary: 111 mg/dL — ABNORMAL HIGH (ref 70–99)

## 2018-07-16 LAB — URINE CULTURE: Culture: NO GROWTH

## 2018-07-16 LAB — RENAL FUNCTION PANEL
ANION GAP: 15 (ref 5–15)
Albumin: 2.5 g/dL — ABNORMAL LOW (ref 3.5–5.0)
BUN: 92 mg/dL — ABNORMAL HIGH (ref 6–20)
CO2: 23 mmol/L (ref 22–32)
Calcium: 8.1 mg/dL — ABNORMAL LOW (ref 8.9–10.3)
Chloride: 103 mmol/L (ref 98–111)
Creatinine, Ser: 15.1 mg/dL — ABNORMAL HIGH (ref 0.61–1.24)
GFR, EST AFRICAN AMERICAN: 4 mL/min — AB (ref >60)
GFR, EST NON AFRICAN AMERICAN: 3 mL/min — AB (ref >60)
Glucose, Bld: 96 mg/dL (ref 70–99)
Phosphorus: 7 mg/dL — ABNORMAL HIGH (ref 2.5–4.6)
Potassium: 5.1 mmol/L (ref 3.5–5.1)
SODIUM: 141 mmol/L (ref 135–145)

## 2018-07-16 LAB — CBC
HCT: 26.7 % — ABNORMAL LOW (ref 39.0–52.0)
HEMOGLOBIN: 8.2 g/dL — AB (ref 13.0–17.0)
MCH: 22 pg — AB (ref 26.0–34.0)
MCHC: 30.7 g/dL (ref 30.0–36.0)
MCV: 71.8 fL — ABNORMAL LOW (ref 78.0–100.0)
PLATELETS: 479 10*3/uL — AB (ref 150–400)
RBC: 3.72 MIL/uL — ABNORMAL LOW (ref 4.22–5.81)
RDW: 15.4 % (ref 11.5–15.5)
WBC: 7.3 10*3/uL (ref 4.0–10.5)

## 2018-07-16 LAB — MAGNESIUM: MAGNESIUM: 2.4 mg/dL (ref 1.7–2.4)

## 2018-07-16 MED ORDER — HEPARIN SODIUM (PORCINE) 1000 UNIT/ML DIALYSIS
20.0000 [IU]/kg | INTRAMUSCULAR | Status: DC | PRN
  Filled 2018-07-16: qty 2

## 2018-07-16 MED ORDER — HEPARIN SODIUM (PORCINE) 1000 UNIT/ML DIALYSIS
1000.0000 [IU] | INTRAMUSCULAR | Status: DC | PRN
  Filled 2018-07-16 (×2): qty 1

## 2018-07-16 MED ORDER — LIDOCAINE-PRILOCAINE 2.5-2.5 % EX CREA
1.0000 "application " | TOPICAL_CREAM | CUTANEOUS | Status: DC | PRN
  Filled 2018-07-16: qty 5

## 2018-07-16 MED ORDER — PENTAFLUOROPROP-TETRAFLUOROETH EX AERO: 1.0000 "application " | INHALATION_SPRAY | CUTANEOUS | Status: DC | PRN

## 2018-07-16 MED ORDER — HEPARIN SODIUM (PORCINE) 1000 UNIT/ML DIALYSIS
1000.0000 [IU] | INTRAMUSCULAR | Status: DC | PRN
  Filled 2018-07-16: qty 1

## 2018-07-16 MED ORDER — SODIUM CHLORIDE 0.9 % IV SOLN: 100.0000 mL | INTRAVENOUS | Status: DC | PRN

## 2018-07-16 MED ORDER — CHLORHEXIDINE GLUCONATE CLOTH 2 % EX PADS
6.0000 | MEDICATED_PAD | Freq: Every day | CUTANEOUS | Status: DC
  Administered 2018-07-17 – 2018-07-19 (×3): 6 via TOPICAL

## 2018-07-16 MED ORDER — CHLORHEXIDINE GLUCONATE CLOTH 2 % EX PADS
6.0000 | MEDICATED_PAD | Freq: Every day | CUTANEOUS | Status: DC
  Administered 2018-07-16 – 2018-07-19 (×2): 6 via TOPICAL

## 2018-07-16 MED ORDER — LIDOCAINE HCL (PF) 1 % IJ SOLN: 5.0000 mL | INTRAMUSCULAR | Status: DC | PRN

## 2018-07-16 MED ORDER — ALTEPLASE 2 MG IJ SOLR: 2.0000 mg | Freq: Once | INTRAMUSCULAR | Status: DC | PRN

## 2018-07-16 MED ORDER — LIDOCAINE-PRILOCAINE 2.5-2.5 % EX CREA: 1.0000 "application " | TOPICAL_CREAM | CUTANEOUS | Status: DC | PRN

## 2018-07-16 NOTE — Consult Note (Signed)
Progress Note  Patient Name: Christopher Dominguez Date of Encounter: 07/16/2018  Primary Cardiologist: Jodelle Red, MD   Subjective   No chest pain. No cardiac complaints.  Inpatient Medications    Scheduled Meds: . chlorhexidine  15 mL Mouth Rinse BID  . Chlorhexidine Gluconate Cloth  6 each Topical Q0600  . Chlorhexidine Gluconate Cloth  6 each Topical Q0600  . Chlorhexidine Gluconate Cloth  6 each Topical Q0600  . Chlorhexidine Gluconate Cloth  6 each Topical Q0600  . Chlorhexidine Gluconate Cloth  6 each Topical Q0600  . feeding supplement (NEPRO CARB STEADY)  237 mL Oral BID BM  . ferrous sulfate  325 mg Oral Q breakfast  . heparin injection (subcutaneous)  5,000 Units Subcutaneous Q8H  . pantoprazole  40 mg Oral Q1200  . tamsulosin  0.4 mg Oral Daily   Continuous Infusions: . sodium chloride 125 mL/hr at 07/16/18 0057  . sodium chloride 10 mL/hr at 07/07/18 0100  . sodium chloride    . sodium chloride     PRN Meds: sodium chloride, sodium chloride, sodium chloride, alteplase, heparin, heparin, hydrALAZINE, lidocaine (PF), lidocaine-prilocaine, lip balm, pentafluoroprop-tetrafluoroeth   Vital Signs    Vitals:   07/15/18 1251 07/15/18 2000 07/16/18 0400 07/16/18 0500  BP: 134/71 (!) 141/99 (!) 152/99   Pulse:  (!) 108 77   Resp:      Temp:  98.7 F (37.1 C) 99 F (37.2 C)   TempSrc:  Oral Oral   SpO2:      Weight:    200 lb 9.9 oz (91 kg)  Height:        Intake/Output Summary (Last 24 hours) at 07/16/2018 1014 Last data filed at 07/16/2018 0600 Gross per 24 hour  Intake 1508.05 ml  Output 1000 ml  Net 508.05 ml   Filed Weights   07/14/18 0500 07/15/18 0418 07/16/18 0500  Weight: 197 lb 8.5 oz (89.6 kg) 199 lb 14.4 oz (90.7 kg) 200 lb 9.9 oz (91 kg)    Telemetry    NSR- Personally Reviewed  ECG    NSR, LVH - Personally Reviewed  Physical Exam   GEN: No acute distress. Some response to questions. Neck: supple, no JVD. R IJ dialysis  catheter. Cardiac: regular S1 and S2, no murmurs, rubs, or gallops.  Respiratory: Clear to auscultation bilaterally. GI: Soft, nontender, non-distended. Bowel sounds normal MS: No edema; No deformity. Neuro:  Nonfocal, moves all limbs independently Psych: Normal affect   Labs    Chemistry Recent Labs  Lab 07/14/18 0434 07/15/18 0623 07/16/18 0559  NA 142 142 141  K 5.2* 5.5* 5.1  CL 101 99 103  CO2 27 27 23   GLUCOSE 98 94 96  BUN 71* 86* 92*  CREATININE 11.44* 14.16* 15.10*  CALCIUM 8.1* 8.3* 8.1*  PROT 6.2*  --   --   ALBUMIN 2.5*  2.4* 2.5* 2.5*  AST 21  --   --   ALT 39  --   --   ALKPHOS 58  --   --   BILITOT 0.4  --   --   GFRNONAA 4* 3* 3*  GFRAA 5* 4* 4*  ANIONGAP 14 16* 15     Hematology Recent Labs  Lab 07/13/18 0130 07/13/18 1408 07/14/18 0434 07/15/18 0623  WBC 9.0  --  8.0 7.6  RBC 3.55*  --  3.91* 3.89*  HGB 8.0* 9.0* 8.7* 8.5*  HCT 25.3* 28.8* 27.8* 27.8*  MCV 71.3*  --  71.1* 71.5*  MCH 22.5*  --  22.3* 21.9*  MCHC 31.6  --  31.3 30.6  RDW 15.6*  --  15.2 15.4  PLT 376  --  445* 480*    Cardiac EnzymesNo results for input(s): TROPONINI in the last 168 hours. No results for input(s): TROPIPOC in the last 168 hours.   BNPNo results for input(s): BNP, PROBNP in the last 168 hours.   DDimer No results for input(s): DDIMER in the last 168 hours.   Radiology    Dg Chest 2 View  Result Date: 07/15/2018 CLINICAL DATA:  Fever. EXAM: CHEST - 2 VIEW COMPARISON:  07/08/2018 FINDINGS: Right jugular central venous catheter is seen with tip overlying the SVC. No evidence of pneumothorax. Heart size is within normal limits. Both lungs are clear. No evidence of pleural effusion. IMPRESSION: Right jugular central venous catheter in appropriate position. No active cardiopulmonary disease. Electronically Signed   By: Myles RosenthalJohn  Stahl M.D.   On: 07/15/2018 15:05   Nm Myocar Multi W/spect W/wall Motion / Ef  Result Date: 07/15/2018 CLINICAL DATA:  CHEST PAIN  EXAM: MYOCARDIAL IMAGING WITH SPECT (REST AND PHARMACOLOGIC-STRESS) GATED LEFT VENTRICULAR WALL MOTION STUDY LEFT VENTRICULAR EJECTION FRACTION TECHNIQUE: Standard myocardial SPECT imaging was performed after resting intravenous injection of 10 mCi Tc-1640m tetrofosmin. Subsequently, intravenous infusion of Lexiscan was performed under the supervision of the Cardiology staff. At peak effect of the drug, 30 mCi Tc-2540m tetrofosmin was injected intravenously and standard myocardial SPECT imaging was performed. Quantitative gated imaging was also performed to evaluate left ventricular wall motion, and estimate left ventricular ejection fraction. COMPARISON:  None. FINDINGS: Perfusion: decreased activity of the septal and inferior walls on the stress views compared to rest imaging on all three views, compatible with inducible ischemia with pharmacologic stress. Wall Motion: grossly normal wall motion.  no lv chamber dilatation. Left Ventricular Ejection Fraction: 59 % End diastolic volume 144 ml End systolic volume 60 ml IMPRESSION: 1. Positive exam for septal and inferior wall inducible ischemia with pharmacologic stress. 2. Grossly normal wall motion. 3. Left ventricular ejection fraction 59% 4. Non invasive risk stratification*: Intermediate *2012 Appropriate Use Criteria for Coronary Revascularization Focused Update: J Am Coll Cardiol. 2012;59(9):857-881. http://content.[email or web address].org/article.aspx?articleid=[email or web address] Electronically Signed   By: Judie PetitM.  Shick M.D.   On: 07/15/2018 15:19    Cardiac Studies   Prior echoes reviewed.  Myoview 07/15/18 FINDINGS: Perfusion: decreased activity of the septal and inferior walls on the stress views compared to rest imaging on all three views, compatible with inducible ischemia with pharmacologic stress.  Wall Motion: grossly normal wall motion.  no lv chamber dilatation.  Left Ventricular Ejection Fraction: 59 %  End diastolic volume 144 ml  End systolic volume 60  ml  IMPRESSION: 1. Positive exam for septal and inferior wall inducible ischemia with pharmacologic stress.  2. Grossly normal wall motion.  3. Left ventricular ejection fraction 59%  4. Non invasive risk stratification: Intermediate  Per Dr. Eden EmmsNishan 07/15/18 Reviewed myovue Disagree with Radiology interpretation On gray scale imaging with no thresshold Images look normal SDS only 3  Higher counts in general on resting images EF normal 59%  Patient Profile     57 y.o. male who presented with sudden death, initially with cardiomyopathy on presentation that has since normalized.   Assessment & Plan    Sudden death: echo normalized after initial presentation. Myoview done yesterday read as concern for inducible ischemia, but second opinion by Dr. Eden EmmsNishan suggest it is a normal study. Will not plan to pursue  cardiac cath at this time.   I spoke with his sister for an extended period about his presentation and recovery. I first saw him on the day he was admitted, and his progress is remarkable. He still has a lot of recovery ahead of him, which she understands. All questions answered.  Recommend follow up with me as outpatient after discharge.   Time Spent Directly with Patient: I have spent a total of 35 minutes with the patient reviewing hospital notes, telemetry, EKGs, labs and examining the patient as well as establishing an assessment and plan that was discussed personally with the patient.  > 50% of time was spent in direct patient care.  Length of Stay:  LOS: 16 days   Jodelle Red, MD, PhD [facility]  [facility]   07/16/2018, 10:14 AM      For questions or updates, please contact [facility] Please consult [email or web address] for contact info under Cardiology/STEMI.

## 2018-07-16 NOTE — Progress Notes (Signed)
Assessment/ Plan:  57 y/o manh/o cardiac arrest   1 Nonoliguric--AKI secondary to arrest.S/p CRRT and IHD, temp IJ cath >>>HD prn(will decide about need for a tunneled cath next week).  For dialysis today.  Will keep even and cont IVF 2 HTN- controlled 3 Anemia stable 4s/pArrest 5H/oSubstance abuse 6Encephpost arrest, improving  Subjective: Interval History: Good UOP  Objective: Vital signs in last 24 hours: Temp:  [98.7 F (37.1 C)-99 F (37.2 C)] 99 F (37.2 C) (07/20 0400) Pulse Rate:  [77-108] 77 (07/20 0400) BP: (134-154)/(66-99) 152/99 (07/20 0400) Weight:  [91 kg (200 lb 9.9 oz)] 91 kg (200 lb 9.9 oz) (07/20 0500) Weight change: 0.326 kg (11.5 oz)  Intake/Output from previous day: 07/19 0701 - 07/20 0700 In: 1508.1 [I.V.:1508.1] Out: 1300 [Urine:1300] Intake/Output this shift: No intake/output data recorded.  General appearance: alert and cooperative  Lungs clear Cor RRR Ext No CCE  Lab Results: Recent Labs    07/14/18 0434 07/15/18 0623  WBC 8.0 7.6  HGB 8.7* 8.5*  HCT 27.8* 27.8*  PLT 445* 480*   BMET:  Recent Labs    07/15/18 0623 07/16/18 0559  NA 142 141  K 5.5* 5.1  CL 99 103  CO2 27 23  GLUCOSE 94 96  BUN 86* 92*  CREATININE 14.16* 15.10*  CALCIUM 8.3* 8.1*   No results for input(s): PTH in the last 72 hours. Iron Studies:  Recent Labs    07/14/18 0434  IRON 36*  TIBC 267  FERRITIN 160   Studies/Results: Dg Chest 2 View  Result Date: 07/15/2018 CLINICAL DATA:  Fever. EXAM: CHEST - 2 VIEW COMPARISON:  07/08/2018 FINDINGS: Right jugular central venous catheter is seen with tip overlying the SVC. No evidence of pneumothorax. Heart size is within normal limits. Both lungs are clear. No evidence of pleural effusion. IMPRESSION: Right jugular central venous catheter in appropriate position. No active cardiopulmonary disease. Electronically Signed   By: Myles Rosenthal M.D.   On: 07/15/2018 15:05   Nm Myocar Multi W/spect W/wall  Motion / Ef  Result Date: 07/15/2018 CLINICAL DATA:  CHEST PAIN EXAM: MYOCARDIAL IMAGING WITH SPECT (REST AND PHARMACOLOGIC-STRESS) GATED LEFT VENTRICULAR WALL MOTION STUDY LEFT VENTRICULAR EJECTION FRACTION TECHNIQUE: Standard myocardial SPECT imaging was performed after resting intravenous injection of 10 mCi Tc-53m tetrofosmin. Subsequently, intravenous infusion of Lexiscan was performed under the supervision of the Cardiology staff. At peak effect of the drug, 30 mCi Tc-80m tetrofosmin was injected intravenously and standard myocardial SPECT imaging was performed. Quantitative gated imaging was also performed to evaluate left ventricular wall motion, and estimate left ventricular ejection fraction. COMPARISON:  None. FINDINGS: Perfusion: decreased activity of the septal and inferior walls on the stress views compared to rest imaging on all three views, compatible with inducible ischemia with pharmacologic stress. Wall Motion: grossly normal wall motion.  no lv chamber dilatation. Left Ventricular Ejection Fraction: 59 % End diastolic volume 144 ml End systolic volume 60 ml IMPRESSION: 1. Positive exam for septal and inferior wall inducible ischemia with pharmacologic stress. 2. Grossly normal wall motion. 3. Left ventricular ejection fraction 59% 4. Non invasive risk stratification*: Intermediate *2012 Appropriate Use Criteria for Coronary Revascularization Focused Update: J Am Coll Cardiol. 2012;59(9):857-881. http://content.[email or web address]?articleid=[email or web address] Electronically Signed   By: Judie Petit.  Shick M.D.   On: 07/15/2018 15:19    Scheduled: . chlorhexidine  15 mL Mouth Rinse BID  . Chlorhexidine Gluconate Cloth  6 each Topical Q0600  . Chlorhexidine Gluconate Cloth  6 each Topical Q0600  .  Chlorhexidine Gluconate Cloth  6 each Topical Q0600  . Chlorhexidine Gluconate Cloth  6 each Topical Q0600  . feeding supplement (NEPRO CARB STEADY)  237 mL Oral BID BM  . ferrous sulfate  325 mg Oral Q  breakfast  . heparin injection (subcutaneous)  5,000 Units Subcutaneous Q8H  . pantoprazole  40 mg Oral Q1200  . tamsulosin  0.4 mg Oral Daily    LOS: 16 days   Lauris PoagAlvin C Twanna Resh 07/16/2018,9:17 AM

## 2018-07-16 NOTE — Progress Notes (Signed)
PROGRESS NOTE    Christopher Dominguez  ZOX:[id number] DOB: 1961-12-28 DOA: 06/30/2018 PCP: Patient, No Pcp Per   Brief Narrative:  57 yo with no significant past medical history, originally from [place] admitted to critical care on 7/4 following a cardiac arrest. -Hospitalization complicated by acute kidney injury requiring hemodialysis, chorea post extubation, encephalopathy from anoxic brain injury, cardiomyopathy with EF down to 25% which recovered to 55% range in 3 days on repeat echo. 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  CXR without active cardiopulm dz.  Follow urinalysis and culture (no growth).  Will obtain blood cx if recurrent temp.  Hold off on abx for now.    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  Recommending proceeding with rehab and f/u outpatient with Dr. Cristal Deer.    Acute respiratory failure with hypoxemia (HCC) - following cardiac arrest - Extubated 7/10 - Also treated for aspiration pneumonia, completed antibiotics - Continue pulmonary toilet, incentive spirometer, nebs as needed - Currently doing well on room air   Encephalopathy  anoxic brain injury  Post Hypoxic Chroea -seen by neurology, noted to have waxing and waning mental status (again  A&Ox1 this am, confused)  - delirium precautions -Haldol when necessary (recommended not continuing this long term), gabapentin could be tried as an alternative if movements are interfering with rehab and less sedating alternative needed (gabapentin 300 mg BID -Will need long-term rehabilitation and outpatient neurology follow-up (if persistent deficits and abnormal movements after rehab) -SLP following on dysphagia 2 diet - Therapy recommending CIR, will follow  Acute renal failure -Due to cardiac arrest, shock -Renal following, was getting CRRT - intermittent HD now, has right temporary IJ HD catheter - Last dialysis yesterday - 1300 cc UOP yesterday - Renal planning to make decision about tunneled dialysis catheter next week  Hematuria/enlarged prostate -Seen by urology -Given very large prostate recommended to avoid Foley unless residuals greater than 500 -continue Flomax   Transient rectal bleeding -Resolved, seen by GI, supportive care for now -Subcutaneous heparin held -> now resumed, continue SCDs for DVT prophylaxis   shock liver -Transaminitis improved -Trend LFTs  Anemia: stable.  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  Will start PO iron.   DVT prophylaxis: heparin Code Status: full  Family Communication: sister at bedside Disposition Plan: pending.  ?inpatient rehab.  Pending decision about dialysis.   Consultants:   Renal  Cards  GI  Urology  Procedures:   R IJ HD catheter CRRT followed by IHD  07/04/18 Study Conclusions  - Left ventricle: The cavity size was normal. Wall thickness was   normal. Systolic function was normal. The estimated ejection   fraction was in the range of 55% to 60%. Wall motion was normal;   there  were no regional wall motion abnormalities. - Aortic root: The aortic root was mildly dilated. - Right ventricle: The cavity size was mildly dilated.  Impressions:  - Normal LV function; mildly dilated  aortic root; mild RVE.  07/01/18 Study Conclusions  - Left ventricle: The cavity size was normal. Wall thickness was   increased in a pattern of mild LVH. Systolic function was   severely reduced. The estimated ejection fraction was in the   range of 25% to 30%. Diffuse hypokinesis. Doppler parameters are   consistent with abnormal left ventricular relaxation (grade 1   diastolic dysfunction). - Aortic root: The aortic root was mildly dilated. - Mitral valve: Calcified annulus. - Right ventricle: The cavity size was mildly dilated. Systolic   function was severely reduced. - Right atrium: The atrium was mildly dilated.  Impressions:  - Technically difficult; definity used; severe LV dysfunction; mild   LVH; mild diastolic dysfunction; enlarged right side with   moderate to severe RV dysfunction.  7/5 Final Interpretation: Right: There is no evidence of deep vein thrombosis in the lower extremity. No cystic structure found in the popliteal fossa. Left: There is no evidence of deep vein thrombosis in the lower extremity. However, portions of this examination were limited- see technologist comments above. No cystic structure found in the popliteal fossa.  Antimicrobials:  Anti-infectives (From admission, onward)   Start     Dose/Rate Route Frequency Ordered Stop   07/06/18 1200  piperacillin-tazobactam (ZOSYN) IVPB 3.375 g    Note to Pharmacy:  Please stop after today   3.375 g 100 mL/hr over 30 Minutes Intravenous Every 6 hours 07/06/18 1015 07/06/18 1932   07/02/18 1800  piperacillin-tazobactam (ZOSYN) IVPB 3.375 g  Status:  Discontinued     3.375 g 12.5 mL/hr over 240 Minutes Intravenous Every 6 hours 07/02/18 1309 07/02/18 1310   07/02/18 1800  piperacillin-tazobactam (ZOSYN) IVPB 3.375 g  Status:  Discontinued     3.375 g 100 mL/hr over 30 Minutes Intravenous Every 6 hours 07/02/18 1311 07/06/18 1015   07/02/18 1200  piperacillin-tazobactam (ZOSYN) IVPB 2.25 g  Status:   Discontinued     2.25 g 100 mL/hr over 30 Minutes Intravenous Every 6 hours 07/02/18 0728 07/02/18 1309   07/01/18 0100  piperacillin-tazobactam (ZOSYN) IVPB 3.375 g  Status:  Discontinued     3.375 g 12.5 mL/hr over 240 Minutes Intravenous Every 8 hours 06/30/18 1812 07/02/18 0728   06/30/18 1815  piperacillin-tazobactam (ZOSYN) IVPB 3.375 g     3.375 g 100 mL/hr over 30 Minutes Intravenous  Once 06/30/18 1804 06/30/18 1945      Subjective: Confused, but alert. Discussed with pt and sister at bedside.   Objective: Vitals:   07/15/18 2000 07/16/18 0400 07/16/18 0500 07/16/18 1139  BP: (!) 141/99 (!) 152/99  (!) 163/97  Pulse: (!) 108 77  84  Resp:    20  Temp: 98.7 F (37.1 C) 99 F (37.2 C)  98.4 F (36.9 C)  TempSrc: Oral Oral  Oral  SpO2:    97%  Weight:   91 kg (200 lb 9.9 oz)   Height:        Intake/Output Summary (Last 24 hours) at 07/16/2018 1719 Last data filed at 07/16/2018 1400 Gross per 24 hour  Intake 3784.45 ml  Output 1900 ml  Net 1884.45 ml   Filed Weights   07/14/18 0500 07/15/18 0418 07/16/18 0500  Weight: 89.6 kg (197 lb 8.5 oz) 90.7 kg (199 lb 14.4 oz)  91 kg (200 lb 9.9 oz)    Examination:  General: No acute distress. Cardiovascular: Heart sounds show a regular rate, and rhythm.  Lungs: Clear to auscultation bilaterally with good air movement.  Abdomen: Soft, nontender, nondistended  Neurological: Alert and oriented 3. Moves all extremities 4. Cranial nerves II through XII grossly intact. Skin: Warm and dry. No rashes or lesions. Extremities: No clubbing or cyanosis. No edema.  Psychiatric:  Insight and judgment are impaired. R IJ cath  Data Reviewed: I have personally reviewed following labs and imaging studies  CBC: Recent Labs  Lab 07/10/18 0240 07/11/18 0301 07/13/18 0130 07/13/18 1408 07/14/18 0434 07/15/18 0623 07/16/18 0559  WBC 12.0* 10.7* 9.0  --  8.0 7.6 7.3  NEUTROABS 8.3* 7.9*  --   --   --   --   --   HGB 9.6* 8.8*  8.0* 9.0* 8.7* 8.5* 8.2*  HCT 31.4* 28.2* 25.3* 28.8* 27.8* 27.8* 26.7*  MCV 71.7* 70.1* 71.3*  --  71.1* 71.5* 71.8*  PLT 233 282 376  --  445* 480* 479*   Basic Metabolic Panel: Recent Labs  Lab 07/12/18 0732 07/13/18 0130 07/14/18 0434 07/15/18 0623 07/16/18 0559  NA 142 142 142 142 141  K 4.9 5.3* 5.2* 5.5* 5.1  CL 106 106 101 99 103  CO2 24 23 27 27 23   GLUCOSE 103* 105* 98 94 96  BUN 81* 101* 71* 86* 92*  CREATININE 12.58* 14.20* 11.44* 14.16* 15.10*  CALCIUM 8.3* 8.0* 8.1* 8.3* 8.1*  MG 2.9* 3.0*  3.1* 2.4 2.6* 2.4  PHOS 6.8* 6.9* 5.8* 6.4* 7.0*   GFR: Estimated Creatinine Clearance: 6.2 mL/min (A) (by C-G formula based on SCr of 15.1 mg/dL (H)). Liver Function Tests: Recent Labs  Lab 07/12/18 0732 07/13/18 0130 07/14/18 0434 07/15/18 0623 07/16/18 0559  AST  --   --  21  --   --   ALT  --   --  39  --   --   ALKPHOS  --   --  58  --   --   BILITOT  --   --  0.4  --   --   PROT  --   --  6.2*  --   --   ALBUMIN 2.4* 2.3* 2.5*  2.4* 2.5* 2.5*   No results for input(s): LIPASE, AMYLASE in the last 168 hours. No results for input(s): AMMONIA in the last 168 hours. Coagulation Profile: No results for input(s): INR, PROTIME in the last 168 hours. Cardiac Enzymes: No results for input(s): CKTOTAL, CKMB, CKMBINDEX, TROPONINI in the last 168 hours. BNP (last 3 results) No results for input(s): PROBNP in the last 8760 hours. HbA1C: No results for input(s): HGBA1C in the last 72 hours. CBG: Recent Labs  Lab 07/16/18 0040 07/16/18 0441 07/16/18 0823 07/16/18 1137 07/16/18 1632  GLUCAP 92 100* 99 108* 125*   Lipid Profile: No results for input(s): CHOL, HDL, LDLCALC, TRIG, CHOLHDL, LDLDIRECT in the last 72 hours. Thyroid Function Tests: No results for input(s): TSH, T4TOTAL, FREET4, T3FREE, THYROIDAB in the last 72 hours. Anemia Panel: Recent Labs    07/14/18 0434  VITAMINB12 782  FOLATE 10.6  FERRITIN 160  TIBC 267  IRON 36*   Sepsis Labs: No  results for input(s): PROCALCITON, LATICACIDVEN in the last 168 hours.  Recent Results (from the past 240 hour(s))  Culture, Urine     Status: None   Collection Time: 07/15/18  4:27 PM  Result Value Ref Range  Status   Specimen Description URINE, CLEAN CATCH  Final   Special Requests NONE  Final   Culture   Final    NO GROWTH Performed at [facility], [address], [place], [place] [address]    Report Status 07/16/2018 FINAL  Final         Radiology Studies: Dg Chest 2 View  Result Date: 07/15/2018 CLINICAL DATA:  Fever. EXAM: CHEST - 2 VIEW COMPARISON:  07/08/2018 FINDINGS: Right jugular central venous catheter is seen with tip overlying the SVC. No evidence of pneumothorax. Heart size is within normal limits. Both lungs are clear. No evidence of pleural effusion. IMPRESSION: Right jugular central venous catheter in appropriate position. No active cardiopulmonary disease. Electronically Signed   By: Myles Rosenthal M.D.   On: 07/15/2018 15:05   Nm Myocar Multi W/spect W/wall Motion / Ef  Result Date: 07/15/2018 CLINICAL DATA:  CHEST PAIN EXAM: MYOCARDIAL IMAGING WITH SPECT (REST AND PHARMACOLOGIC-STRESS) GATED LEFT VENTRICULAR WALL MOTION STUDY LEFT VENTRICULAR EJECTION FRACTION TECHNIQUE: Standard myocardial SPECT imaging was performed after resting intravenous injection of 10 mCi Tc-56m tetrofosmin. Subsequently, intravenous infusion of Lexiscan was performed under the supervision of the Cardiology staff. At peak effect of the drug, 30 mCi Tc-48m tetrofosmin was injected intravenously and standard myocardial SPECT imaging was performed. Quantitative gated imaging was also performed to evaluate left ventricular wall motion, and estimate left ventricular ejection fraction. COMPARISON:  None. FINDINGS: Perfusion: decreased activity of the septal and inferior walls on the stress views compared to rest imaging on all three views, compatible with inducible ischemia with pharmacologic  stress. Wall Motion: grossly normal wall motion.  no lv chamber dilatation. Left Ventricular Ejection Fraction: 59 % End diastolic volume 144 ml End systolic volume 60 ml IMPRESSION: 1. Positive exam for septal and inferior wall inducible ischemia with pharmacologic stress. 2. Grossly normal wall motion. 3. Left ventricular ejection fraction 59% 4. Non invasive risk stratification*: Intermediate *2012 Appropriate Use Criteria for Coronary Revascularization Focused Update: J Am Coll Cardiol. 2012;59(9):857-881. http://content.dementiazones.com.aspx?articleid=1201161 Electronically Signed   By: Judie Petit.  Shick M.D.   On: 07/15/2018 15:19        Scheduled Meds: . chlorhexidine  15 mL Mouth Rinse BID  . Chlorhexidine Gluconate Cloth  6 each Topical Q0600  . Chlorhexidine Gluconate Cloth  6 each Topical Q0600  . Chlorhexidine Gluconate Cloth  6 each Topical Q0600  . Chlorhexidine Gluconate Cloth  6 each Topical Q0600  . Chlorhexidine Gluconate Cloth  6 each Topical Q0600  . feeding supplement (NEPRO CARB STEADY)  237 mL Oral BID BM  . ferrous sulfate  325 mg Oral Q breakfast  . heparin injection (subcutaneous)  5,000 Units Subcutaneous Q8H  . pantoprazole  40 mg Oral Q1200  . tamsulosin  0.4 mg Oral Daily   Continuous Infusions: . sodium chloride 125 mL/hr at 07/16/18 0057  . sodium chloride    . sodium chloride       LOS: 16 days    Time spent: over 30 min    Lacretia Nicks, MD [facility] Pager [phone]  If 7PM-7AM, please contact night-coverage [email or web address] Password [facility] 07/16/2018, 5:19 PM

## 2018-07-16 NOTE — Progress Notes (Signed)
Hemodialysis canceled for today by Dr Lowell GuitarPowell, rescheduled for 07/17/2018. Patients Primary RN Rodney Boozeasha Notified of schedule change at 1745.

## 2018-07-17 LAB — GLUCOSE, CAPILLARY
GLUCOSE-CAPILLARY: 118 mg/dL — AB (ref 70–99)
GLUCOSE-CAPILLARY: 100 mg/dL — AB (ref 70–99)
GLUCOSE-CAPILLARY: 125 mg/dL — AB (ref 70–99)
Glucose-Capillary: 97 mg/dL (ref 70–99)

## 2018-07-17 LAB — CBC
HCT: 24.1 % — ABNORMAL LOW (ref 39.0–52.0)
Hemoglobin: 7.5 g/dL — ABNORMAL LOW (ref 13.0–17.0)
MCH: 22.1 pg — ABNORMAL LOW (ref 26.0–34.0)
MCHC: 31.1 g/dL (ref 30.0–36.0)
MCV: 71.1 fL — ABNORMAL LOW (ref 78.0–100.0)
PLATELETS: 476 10*3/uL — AB (ref 150–400)
RBC: 3.39 MIL/uL — AB (ref 4.22–5.81)
RDW: 15.2 % (ref 11.5–15.5)
WBC: 7.7 10*3/uL (ref 4.0–10.5)

## 2018-07-17 LAB — RENAL FUNCTION PANEL
ALBUMIN: 2.4 g/dL — AB (ref 3.5–5.0)
ANION GAP: 13 (ref 5–15)
BUN: 88 mg/dL — ABNORMAL HIGH (ref 6–20)
CALCIUM: 8.2 mg/dL — AB (ref 8.9–10.3)
CO2: 21 mmol/L — AB (ref 22–32)
Chloride: 106 mmol/L (ref 98–111)
Creatinine, Ser: 13.83 mg/dL — ABNORMAL HIGH (ref 0.61–1.24)
GFR calc non Af Amer: 3 mL/min — ABNORMAL LOW (ref >60)
GFR, EST AFRICAN AMERICAN: 4 mL/min — AB (ref >60)
Glucose, Bld: 106 mg/dL — ABNORMAL HIGH (ref 70–99)
PHOSPHORUS: 6.4 mg/dL — AB (ref 2.5–4.6)
Potassium: 4.9 mmol/L (ref 3.5–5.1)
SODIUM: 140 mmol/L (ref 135–145)

## 2018-07-17 LAB — HEPATIC FUNCTION PANEL
ALT: 23 U/L (ref 0–44)
AST: 17 U/L (ref 15–41)
Albumin: 2.4 g/dL — ABNORMAL LOW (ref 3.5–5.0)
Alkaline Phosphatase: 52 U/L (ref 38–126)
Bilirubin, Direct: <0.1 mg/dL (ref 0.0–0.2)
TOTAL PROTEIN: 5.8 g/dL — AB (ref 6.5–8.1)
Total Bilirubin: 0.5 mg/dL (ref 0.3–1.2)

## 2018-07-17 LAB — HEMOGLOBIN AND HEMATOCRIT, BLOOD
HEMATOCRIT: 26 % — AB (ref 39.0–52.0)
Hemoglobin: 8 g/dL — ABNORMAL LOW (ref 13.0–17.0)

## 2018-07-17 LAB — TYPE AND SCREEN
ABO/RH(D): O POS
Antibody Screen: NEGATIVE

## 2018-07-17 LAB — MAGNESIUM: Magnesium: 2.4 mg/dL (ref 1.7–2.4)

## 2018-07-17 MED ORDER — AMLODIPINE BESYLATE 5 MG PO TABS
5.0000 mg | ORAL_TABLET | Freq: Every day | ORAL | Status: DC
  Administered 2018-07-17 – 2018-07-19 (×3): 5 mg via ORAL
  Filled 2018-07-17 (×3): qty 1

## 2018-07-17 NOTE — Progress Notes (Signed)
Assessment/ Plan:  57 y/o manh/o cardiac arrest   1Nonoliguric--AKI secondary to arrest.S/p CRRT and IHD, temp IJ cath >>>HDprn.                            He is in the recovery phase with good UOP and a fall in creat.                             Hold dialysis. Cont IVF 2 HTN- suboptimal, start amlodipine 3 Anemia stable 4s/pArrest 5H/oSubstance abuse 6Encephpost arrest, improving  Subjective: Interval History: Good UOP  Objective: Vital signs in last 24 hours: Temp:  [99.6 F (37.6 C)] 99.6 F (37.6 C) (07/20 2032) Pulse Rate:  [80] 80 (07/20 2032) Resp:  [20] 20 (07/20 2032) BP: (153)/(96) 153/96 (07/20 2032) SpO2:  [98 %] 98 % (07/20 2032) Weight:  [91.4 kg (201 lb 8 oz)] 91.4 kg (201 lb 8 oz) (07/21 0100) Weight change: 0.4 kg (14.1 oz)  Intake/Output from previous day: 07/20 0701 - 07/21 0700 In: 2276.4 [P.O.:1189; I.V.:1087.4] Out: 2875 [Urine:2875] Intake/Output this shift: Total I/O In: 120 [P.O.:120] Out: 1200 [Urine:1200]  General appearance: alert, cooperative and slowed mentation Neck: no adenopathy, no carotid bruit, no JVD, supple, symmetrical, trachea midline, thyroid not enlarged, symmetric, no tenderness/mass/nodules and RIJ temp cath Resp: clear to auscultation bilaterally Cardio: regular rate and rhythm, S1, S2 normal, no murmur, click, rub or gallop Extremities: extremities normal, atraumatic, no cyanosis or edema  Lab Results: Recent Labs    07/16/18 0559 07/17/18 0543  WBC 7.3 7.7  HGB 8.2* 7.5*  HCT 26.7* 24.1*  PLT 479* 476*   BMET:  Recent Labs    07/16/18 0559 07/17/18 0543  NA 141 140  K 5.1 4.9  CL 103 106  CO2 23 21*  GLUCOSE 96 106*  BUN 92* 88*  CREATININE 15.10* 13.83*  CALCIUM 8.1* 8.2*   No results for input(s): PTH in the last 72 hours. Iron Studies: No results for input(s): IRON, TIBC, TRANSFERRIN, FERRITIN in the last 72 hours. Studies/Results: Dg Chest 2 View  Result Date: 07/15/2018 CLINICAL  DATA:  Fever. EXAM: CHEST - 2 VIEW COMPARISON:  07/08/2018 FINDINGS: Right jugular central venous catheter is seen with tip overlying the SVC. No evidence of pneumothorax. Heart size is within normal limits. Both lungs are clear. No evidence of pleural effusion. IMPRESSION: Right jugular central venous catheter in appropriate position. No active cardiopulmonary disease. Electronically Signed   By: Myles Rosenthal M.D.   On: 07/15/2018 15:05   Nm Myocar Multi W/spect W/wall Motion / Ef  Result Date: 07/15/2018 CLINICAL DATA:  CHEST PAIN EXAM: MYOCARDIAL IMAGING WITH SPECT (REST AND PHARMACOLOGIC-STRESS) GATED LEFT VENTRICULAR WALL MOTION STUDY LEFT VENTRICULAR EJECTION FRACTION TECHNIQUE: Standard myocardial SPECT imaging was performed after resting intravenous injection of 10 mCi Tc-72m tetrofosmin. Subsequently, intravenous infusion of Lexiscan was performed under the supervision of the Cardiology staff. At peak effect of the drug, 30 mCi Tc-40m tetrofosmin was injected intravenously and standard myocardial SPECT imaging was performed. Quantitative gated imaging was also performed to evaluate left ventricular wall motion, and estimate left ventricular ejection fraction. COMPARISON:  None. FINDINGS: Perfusion: decreased activity of the septal and inferior walls on the stress views compared to rest imaging on all three views, compatible with inducible ischemia with pharmacologic stress. Wall Motion: grossly normal wall motion.  no lv chamber dilatation. Left Ventricular Ejection Fraction:  59 % End diastolic volume 144 ml End systolic volume 60 ml IMPRESSION: 1. Positive exam for septal and inferior wall inducible ischemia with pharmacologic stress. 2. Grossly normal wall motion. 3. Left ventricular ejection fraction 59% 4. Non invasive risk stratification*: Intermediate *2012 Appropriate Use Criteria for Coronary Revascularization Focused Update: J Am Coll Cardiol. 2012;59(9):857-881.  http://content.dementiazones.comonlinejacc.org/article.aspx?articleid=1201161 Electronically Signed   By: Judie PetitM.  Shick M.D.   On: 07/15/2018 15:19    Scheduled: . chlorhexidine  15 mL Mouth Rinse BID  . Chlorhexidine Gluconate Cloth  6 each Topical Q0600  . Chlorhexidine Gluconate Cloth  6 each Topical Q0600  . Chlorhexidine Gluconate Cloth  6 each Topical Q0600  . Chlorhexidine Gluconate Cloth  6 each Topical Q0600  . Chlorhexidine Gluconate Cloth  6 each Topical Q0600  . feeding supplement (NEPRO CARB STEADY)  237 mL Oral BID BM  . ferrous sulfate  325 mg Oral Q breakfast  . heparin injection (subcutaneous)  5,000 Units Subcutaneous Q8H  . pantoprazole  40 mg Oral Q1200  . tamsulosin  0.4 mg Oral Daily   Continuous: . sodium chloride 125 mL/hr at 07/17/18 0219  . sodium chloride    . sodium chloride      LOS: 17 days   Lauris PoagAlvin C Hosey Burmester 07/17/2018,11:52 AM

## 2018-07-17 NOTE — Progress Notes (Signed)
Progress Note  Patient Name: Christopher KaufmannShelton M Dominguez Date of Encounter: 07/17/2018  Primary Cardiologist: Jodelle RedBridgette Octa Uplinger, MD   Subjective   No chest pain. No cardiac complaints. Sleeping comfortably in bed this AM, no family present.  Inpatient Medications    Scheduled Meds: . chlorhexidine  15 mL Mouth Rinse BID  . Chlorhexidine Gluconate Cloth  6 each Topical Q0600  . Chlorhexidine Gluconate Cloth  6 each Topical Q0600  . Chlorhexidine Gluconate Cloth  6 each Topical Q0600  . Chlorhexidine Gluconate Cloth  6 each Topical Q0600  . Chlorhexidine Gluconate Cloth  6 each Topical Q0600  . feeding supplement (NEPRO CARB STEADY)  237 mL Oral BID BM  . ferrous sulfate  325 mg Oral Q breakfast  . heparin injection (subcutaneous)  5,000 Units Subcutaneous Q8H  . pantoprazole  40 mg Oral Q1200  . tamsulosin  0.4 mg Oral Daily   Continuous Infusions: . sodium chloride 125 mL/hr at 07/17/18 0219  . sodium chloride    . sodium chloride     PRN Meds: sodium chloride, sodium chloride, alteplase, heparin, heparin, heparin, heparin, heparin, heparin, heparin, heparin, hydrALAZINE, lidocaine (PF), lidocaine-prilocaine, lip balm, pentafluoroprop-tetrafluoroeth   Vital Signs    Vitals:   07/16/18 0500 07/16/18 1139 07/16/18 2032 07/17/18 0100  BP:  (!) 163/97 (!) 153/96   Pulse:  84 80   Resp:  20 20   Temp:  98.4 F (36.9 C) 99.6 F (37.6 C)   TempSrc:  Oral Oral   SpO2:  97% 98%   Weight: 200 lb 9.9 oz (91 kg)   201 lb 8 oz (91.4 kg)  Height:        Intake/Output Summary (Last 24 hours) at 07/17/2018 0827 Last data filed at 07/17/2018 0224 Gross per 24 hour  Intake 2276.4 ml  Output 2875 ml  Net -598.6 ml   Filed Weights   07/15/18 0418 07/16/18 0500 07/17/18 0100  Weight: 199 lb 14.4 oz (90.7 kg) 200 lb 9.9 oz (91 kg) 201 lb 8 oz (91.4 kg)    Telemetry    NSR- Personally Reviewed, Had significant artifact this AM that read as asystole but was related to  movement.  ECG    NSR, LVH - Personally Reviewed  Physical Exam   GEN: No acute distress. Some response to questions. Neck: supple, no JVD. R IJ dialysis catheter. Cardiac: regular S1 and S2, no murmurs, rubs, or gallops.  Respiratory: Clear to auscultation bilaterally. GI: Soft, nontender, non-distended. Bowel sounds normal MS: No edema; No deformity. Neuro:  Nonfocal, moves all limbs independently Psych: Normal affect   Labs    Chemistry Recent Labs  Lab 07/14/18 0434 07/15/18 0623 07/16/18 0559 07/17/18 0543  NA 142 142 141 140  K 5.2* 5.5* 5.1 4.9  CL 101 99 103 106  CO2 27 27 23  21*  GLUCOSE 98 94 96 106*  BUN 71* 86* 92* 88*  CREATININE 11.44* 14.16* 15.10* 13.83*  CALCIUM 8.1* 8.3* 8.1* 8.2*  PROT 6.2*  --   --  5.8*  ALBUMIN 2.5*  2.4* 2.5* 2.5* 2.4*  2.4*  AST 21  --   --  17  ALT 39  --   --  23  ALKPHOS 58  --   --  52  BILITOT 0.4  --   --  0.5  GFRNONAA 4* 3* 3* 3*  GFRAA 5* 4* 4* 4*  ANIONGAP 14 16* 15 13     Hematology Recent Labs  Lab 07/15/18  1610 07/16/18 0559 07/17/18 0543  WBC 7.6 7.3 7.7  RBC 3.89* 3.72* 3.39*  HGB 8.5* 8.2* 7.5*  HCT 27.8* 26.7* 24.1*  MCV 71.5* 71.8* 71.1*  MCH 21.9* 22.0* 22.1*  MCHC 30.6 30.7 31.1  RDW 15.4 15.4 15.2  PLT 480* 479* 476*    Cardiac EnzymesNo results for input(s): TROPONINI in the last 168 hours. No results for input(s): TROPIPOC in the last 168 hours.   BNPNo results for input(s): BNP, PROBNP in the last 168 hours.   DDimer No results for input(s): DDIMER in the last 168 hours.   Radiology    Dg Chest 2 View  Result Date: 07/15/2018 CLINICAL DATA:  Fever. EXAM: CHEST - 2 VIEW COMPARISON:  07/08/2018 FINDINGS: Right jugular central venous catheter is seen with tip overlying the SVC. No evidence of pneumothorax. Heart size is within normal limits. Both lungs are clear. No evidence of pleural effusion. IMPRESSION: Right jugular central venous catheter in appropriate position. No active  cardiopulmonary disease. Electronically Signed   By: Myles Rosenthal M.D.   On: 07/15/2018 15:05   Nm Myocar Multi W/spect W/wall Motion / Ef  Result Date: 07/15/2018 CLINICAL DATA:  CHEST PAIN EXAM: MYOCARDIAL IMAGING WITH SPECT (REST AND PHARMACOLOGIC-STRESS) GATED LEFT VENTRICULAR WALL MOTION STUDY LEFT VENTRICULAR EJECTION FRACTION TECHNIQUE: Standard myocardial SPECT imaging was performed after resting intravenous injection of 10 mCi Tc-73m tetrofosmin. Subsequently, intravenous infusion of Lexiscan was performed under the supervision of the Cardiology staff. At peak effect of the drug, 30 mCi Tc-55m tetrofosmin was injected intravenously and standard myocardial SPECT imaging was performed. Quantitative gated imaging was also performed to evaluate left ventricular wall motion, and estimate left ventricular ejection fraction. COMPARISON:  None. FINDINGS: Perfusion: decreased activity of the septal and inferior walls on the stress views compared to rest imaging on all three views, compatible with inducible ischemia with pharmacologic stress. Wall Motion: grossly normal wall motion.  no lv chamber dilatation. Left Ventricular Ejection Fraction: 59 % End diastolic volume 144 ml End systolic volume 60 ml IMPRESSION: 1. Positive exam for septal and inferior wall inducible ischemia with pharmacologic stress. 2. Grossly normal wall motion. 3. Left ventricular ejection fraction 59% 4. Non invasive risk stratification*: Intermediate *2012 Appropriate Use Criteria for Coronary Revascularization Focused Update: J Am Coll Cardiol. 2012;59(9):857-881. http://content.[email or web address]?[email or web address] Electronically Signed   By: Judie Petit.  Shick M.D.   On: 07/15/2018 15:19    Cardiac Studies   Prior echoes reviewed.  Myoview 07/15/18 FINDINGS: Perfusion: decreased activity of the septal and inferior walls on the stress views compared to rest imaging on all three views, compatible with inducible ischemia with  pharmacologic stress.  Wall Motion: grossly normal wall motion.  no lv chamber dilatation.  Left Ventricular Ejection Fraction: 59 %  End diastolic volume 144 ml  End systolic volume 60 ml  IMPRESSION: 1. Positive exam for septal and inferior wall inducible ischemia with pharmacologic stress.  2. Grossly normal wall motion.  3. Left ventricular ejection fraction 59%  4. Non invasive risk stratification: Intermediate  Per Dr. Eden Emms 07/15/18 Reviewed myovue Disagree with Radiology interpretation On gray scale imaging with no thresshold Images look normal SDS only 3  Higher counts in general on resting images EF normal 59%  Patient Profile     57 y.o. male who presented with sudden death, initially with cardiomyopathy on presentation that has since normalized.   Assessment & Plan    Sudden death:  -echo normalized after initial presentation -Myoview done 7/19, per Dr. Fabio Bering  review suggests a normal study. No plans to pursue cath at this time.  -suspect cause was due to illicit drug use.  He has significant rehabilitation ahead in terms of his anoxic brain injury, and he is currently receiving hemodialysis, with monitoring for possible return of kidney function.  As his cardiac issues have stabilized, we will sign off at this time. Please call us if there are any new concerns this hospitalization.   Recommend follow up with me as outpatient after discharge.   Time Spent Directly with Patient: I have spent a total of 25 minutes with the patient reviewing hospital notes, telemetry, EKGs, labs and examining the patient as well as establishing an assessment and plan that was discussed personally with the patient.  > 50% of time was spent in direct patient care.  Length of Stay:  LOS: 17 days   Jodelle Red, MD, PhD [facility]   07/17/2018, 8:27 AM   [facility] will sign off.   Medication Recommendations:  No changes at this  time. If his blood pressure remains elevated, and it is not due to volume/HD, would consider amlodipine long term. Avoid ACEI/ARB unless kidney function recovers, defer to nephrology. Could consider carvedilol, but if there is concern for relapse to illicits at discharge would avoid beta blocker. Other recommendations (labs, testing, etc):  None from a cardiac perspective. Follow up as an outpatient:  Dr. Cristal Deer, within two weeks of discharge.  For questions or updates, please contact [facility] Please consult [email or web address] for contact info under Cardiology/STEMI.

## 2018-07-17 NOTE — Progress Notes (Signed)
PROGRESS NOTE    Christopher KaufmannShelton M Dominguez  ZHY:[id number] DOB: 1961-06-07 DOA: 06/30/2018 PCP: Patient, No Pcp Per   Brief Narrative:  57 yo with no significant past medical history, [place] to critical care on 7/4 following a cardiac arrest. -Hospitalization complicated by acute kidney injury requiring hemodialysis, chorea post extubation, encephalopathy from anoxic brain injury, cardiomyopathy with EF down to 25% which recovered to 55% range in 3 days on repeat echo. 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  CXR without active cardiopulm dz.  Follow urinalysis and culture (no growth).  Will obtain blood cx if recurrent temp.  Hold off on abx for now.  Afebrile since, follow.  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  Recommending proceeding with rehab and f/u outpatient with Dr. Cristal Deerhristopher.    Acute respiratory failure with hypoxemia (HCC) - following cardiac arrest - Extubated 7/10 - Also treated for aspiration pneumonia, completed antibiotics - Continue pulmonary toilet, incentive spirometer, nebs as needed - Currently doing well on room air   Encephalopathy  anoxic brain injury  Post Hypoxic Chroea -seen by neurology, noted to have waxing and  waning mental status (this has continued as I've seen him as well) - delirium precautions -Haldol when necessary (recommended not continuing this long term), gabapentin could be tried as an alternative if movements are interfering with rehab and less sedating alternative needed (gabapentin 300 mg BID -Will need long-term rehabilitation and outpatient neurology follow-up (if persistent deficits and abnormal movements after rehab) -SLP following on dysphagia 2 diet - Therapy recommending CIR, will follow  Acute renal failure -Due to cardiac arrest, shock -Renal following, was getting CRRT - intermittent HD now, has right temporary IJ HD catheter - Holding dialysis with fall in creatinine and improved UOP.  IVF per nephrology. - 2.8 L UOP yesterday - Renal planning to make decision about tunneled dialysis catheter next week  Nausea  Vomiting: occurred x1 today and a couple of days ago as well.  Having BM, no tenderness on exam.  Will ctm for now.  Will repeat EKG for QT eval (prolonged initially) in case he needs antiemetics.  Hematuria/enlarged prostate -Seen by urology -Given very large prostate recommended to avoid Foley unless residuals greater than 500 -continue Flomax   Transient rectal bleeding -Resolved, seen by GI, supportive care for now -Subcutaneous heparin held -> now resumed, continue SCDs for DVT prophylaxis   shock liver -Transaminitis improved -Trend LFTs  Anemia: stable.  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  Will start PO iron.   Hypertension: started on amlodipine today by renal  DVT prophylaxis: heparin Code Status: full  Family Communication: sister at bedside Disposition Plan: pending.  ?inpatient rehab vs SNF (seems more likely at this point).  Pending decision about dialysis.   Consultants:   Renal  Cards  GI  Urology  Procedures:   R IJ HD catheter CRRT followed by IHD  07/04/18 Study Conclusions  - Left ventricle:  The cavity size was normal. Wall thickness was   normal. Systolic function was normal. The estimated ejection   fraction was in the range of 55% to 60%. Wall motion was normal;   there were no regional wall motion abnormalities. - Aortic root: The aortic root was mildly dilated. - Right ventricle: The cavity size was mildly dilated.  Impressions:  - Normal LV function; mildly dilated aortic root; mild RVE.  07/01/18 Study Conclusions  - Left ventricle: The cavity size was normal. Wall thickness was   increased in a pattern of mild LVH. Systolic function was   severely reduced. The estimated ejection fraction was in the   range of 25% to 30%. Diffuse hypokinesis. Doppler parameters are   consistent with abnormal left ventricular relaxation (grade 1   diastolic dysfunction). - Aortic root: The aortic root was mildly dilated. - Mitral valve: Calcified annulus. - Right ventricle: The cavity size was mildly dilated. Systolic   function was severely reduced. - Right atrium: The atrium was mildly dilated.  Impressions:  - Technically difficult; definity used; severe LV dysfunction; mild   LVH; mild diastolic dysfunction; enlarged right side with   moderate to severe RV dysfunction.  7/5 Final Interpretation: Right: There is no evidence of deep vein thrombosis in the lower extremity. No cystic structure found in the popliteal fossa. Left: There is no evidence of deep vein thrombosis in the lower extremity. However, portions of this examination were limited- see technologist comments above. No cystic structure found in the popliteal fossa.  Antimicrobials:  Anti-infectives (From admission, onward)   Start     Dose/Rate Route Frequency Ordered Stop   07/06/18 1200  piperacillin-tazobactam (ZOSYN) IVPB 3.375 g    Note to Pharmacy:  Please stop after today   3.375 g 100 mL/hr over 30 Minutes Intravenous Every 6 hours 07/06/18 1015 07/06/18 1932   07/02/18 1800   piperacillin-tazobactam (ZOSYN) IVPB 3.375 g  Status:  Discontinued     3.375 g 12.5 mL/hr over 240 Minutes Intravenous Every 6 hours 07/02/18 1309 07/02/18 1310   07/02/18 1800  piperacillin-tazobactam (ZOSYN) IVPB 3.375 g  Status:  Discontinued     3.375 g 100 mL/hr over 30 Minutes Intravenous Every 6 hours 07/02/18 1311 07/06/18 1015   07/02/18 1200  piperacillin-tazobactam (ZOSYN) IVPB 2.25 g  Status:  Discontinued     2.25 g 100 mL/hr over 30 Minutes Intravenous Every 6 hours 07/02/18 0728 07/02/18 1309   07/01/18 0100  piperacillin-tazobactam (ZOSYN) IVPB 3.375 g  Status:  Discontinued     3.375 g 12.5 mL/hr over 240 Minutes Intravenous Every 8 hours 06/30/18 1812 07/02/18 0728   06/30/18 1815  piperacillin-tazobactam (ZOSYN) IVPB 3.375 g     3.375 g 100 mL/hr over 30 Minutes Intravenous  Once 06/30/18 1804 06/30/18 1945      Subjective: Confused, but alert. Sister at bedside. Denies pain.  Just threw up.  Objective: Vitals:   07/16/18 1139 07/16/18 2032 07/17/18 0100 07/17/18 1218  BP: (!) 163/97 (!) 153/96  (!) 137/98  Pulse: 84 80    Resp: 20 20  20   Temp: 98.4 F (36.9 C) 99.6 F (37.6 C)  98.7 F (37.1 C)  TempSrc: Oral Oral  Oral  SpO2: 97% 98%    Weight:  91.4 kg (201 lb 8 oz)   Height:        Intake/Output Summary (Last 24 hours) at 07/17/2018 1623 Last data filed at 07/17/2018 1506 Gross per 24 hour  Intake 240 ml  Output 3175 ml  Net -2935 ml   Filed Weights   07/15/18 0418 07/16/18 0500 07/17/18 0100  Weight: 90.7 kg (199 lb 14.4 oz) 91 kg (200 lb 9.9 oz) 91.4 kg (201 lb 8 oz)    Examination:  General: No acute distress. Cardiovascular: Heart sounds show a regular rate, and rhythm Lungs: Clear to auscultation bilaterally Abdomen: Soft, nontender, nondistended Neurological: Alert and disoriented. Moves all extremities 4. Cranial nerves II through XII grossly intact. Skin: Warm and dry. No rashes or lesions. Extremities: No clubbing or  cyanosis. No edema.  Psychiatric:  Insight and judgment are impaired. R IJ catheter  Data Reviewed: I have personally reviewed following labs and imaging studies  CBC: Recent Labs  Lab 07/11/18 0301 07/13/18 0130  07/14/18 0434 07/15/18 0623 07/16/18 0559 07/17/18 0543 07/17/18 1415  WBC 10.7* 9.0  --  8.0 7.6 7.3 7.7  --   NEUTROABS 7.9*  --   --   --   --   --   --   --   HGB 8.8* 8.0*   < > 8.7* 8.5* 8.2* 7.5* 8.0*  HCT 28.2* 25.3*   < > 27.8* 27.8* 26.7* 24.1* 26.0*  MCV 70.1* 71.3*  --  71.1* 71.5* 71.8* 71.1*  --   PLT 282 376  --  445* 480* 479* 476*  --    < > = values in this interval not displayed.   Basic Metabolic Panel: Recent Labs  Lab 07/13/18 0130 07/14/18 0434 07/15/18 0623 07/16/18 0559 07/17/18 0543  NA 142 142 142 141 140  K 5.3* 5.2* 5.5* 5.1 4.9  CL 106 101 99 103 106  CO2 23 27 27 23  21*  GLUCOSE 105* 98 94 96 106*  BUN 101* 71* 86* 92* 88*  CREATININE 14.20* 11.44* 14.16* 15.10* 13.83*  CALCIUM 8.0* 8.1* 8.3* 8.1* 8.2*  MG 3.0*  3.1* 2.4 2.6* 2.4 2.4  PHOS 6.9* 5.8* 6.4* 7.0* 6.4*   GFR: Estimated Creatinine Clearance: 6.7 mL/min (A) (by C-G formula based on SCr of 13.83 mg/dL (H)). Liver Function Tests: Recent Labs  Lab 07/13/18 0130 07/14/18 0434 07/15/18 0623 07/16/18 0559 07/17/18 0543  AST  --  21  --   --  17  ALT  --  39  --   --  23  ALKPHOS  --  58  --   --  52  BILITOT  --  0.4  --   --  0.5  PROT  --  6.2*  --   --  5.8*  ALBUMIN 2.3* 2.5*  2.4* 2.5* 2.5* 2.4*  2.4*   No results for input(s): LIPASE, AMYLASE in the last 168 hours. No results for input(s): AMMONIA in the last 168 hours. Coagulation Profile: No results for input(s): INR, PROTIME in the last 168 hours. Cardiac Enzymes: No results for input(s): CKTOTAL, CKMB, CKMBINDEX, TROPONINI in the last 168 hours. BNP (last 3 results) No results for input(s): PROBNP in the last 8760 hours. HbA1C: No results for input(s): HGBA1C in the last 72  hours. CBG: Recent Labs  Lab 07/16/18 1137 07/16/18 1632 07/16/18 2357 07/17/18 0724 07/17/18 1123  GLUCAP 108* 125* 111* 97 118*   Lipid Profile: No results for input(s): CHOL, HDL, LDLCALC, TRIG, CHOLHDL, LDLDIRECT in  the last 72 hours. Thyroid Function Tests: No results for input(s): TSH, T4TOTAL, FREET4, T3FREE, THYROIDAB in the last 72 hours. Anemia Panel: No results for input(s): VITAMINB12, FOLATE, FERRITIN, TIBC, IRON, RETICCTPCT in the last 72 hours. Sepsis Labs: No results for input(s): PROCALCITON, LATICACIDVEN in the last 168 hours.  Recent Results (from the past 240 hour(s))  Culture, Urine     Status: None   Collection Time: 07/15/18  4:27 PM  Result Value Ref Range Status   Specimen Description URINE, CLEAN CATCH  Final   Special Requests NONE  Final   Culture   Final    NO GROWTH Performed at [facility], [address], [place], [place] [address]    Report Status 07/16/2018 FINAL  Final         Radiology Studies: No results found.      Scheduled Meds: . amLODipine  5 mg Oral Daily  . chlorhexidine  15 mL Mouth Rinse BID  . Chlorhexidine Gluconate Cloth  6 each Topical Q0600  . Chlorhexidine Gluconate Cloth  6 each Topical Q0600  . Chlorhexidine Gluconate Cloth  6 each Topical Q0600  . Chlorhexidine Gluconate Cloth  6 each Topical Q0600  . Chlorhexidine Gluconate Cloth  6 each Topical Q0600  . feeding supplement (NEPRO CARB STEADY)  237 mL Oral BID BM  . ferrous sulfate  325 mg Oral Q breakfast  . heparin injection (subcutaneous)  5,000 Units Subcutaneous Q8H  . pantoprazole  40 mg Oral Q1200  . tamsulosin  0.4 mg Oral Daily   Continuous Infusions: . sodium chloride 125 mL/hr at 07/17/18 0219     LOS: 17 days    Time spent: over 30 min    Lacretia Nicks, MD [facility] Pager [phone]  If 7PM-7AM, please contact night-coverage [email or web address] Password [facility] 07/17/2018, 4:23 PM

## 2018-07-18 LAB — CBC
HCT: 28.4 % — ABNORMAL LOW (ref 39.0–52.0)
HEMOGLOBIN: 8.5 g/dL — AB (ref 13.0–17.0)
MCH: 21.6 pg — AB (ref 26.0–34.0)
MCHC: 29.9 g/dL — ABNORMAL LOW (ref 30.0–36.0)
MCV: 72.1 fL — AB (ref 78.0–100.0)
Platelets: 477 10*3/uL — ABNORMAL HIGH (ref 150–400)
RBC: 3.94 MIL/uL — ABNORMAL LOW (ref 4.22–5.81)
RDW: 15.2 % (ref 11.5–15.5)
WBC: 6.3 10*3/uL (ref 4.0–10.5)

## 2018-07-18 LAB — IRON AND TIBC
Iron: 36 ug/dL — ABNORMAL LOW (ref 45–182)
SATURATION RATIOS: 12 % — AB (ref 17.9–39.5)
TIBC: 302 ug/dL (ref 250–450)
UIBC: 266 ug/dL

## 2018-07-18 LAB — RENAL FUNCTION PANEL
ANION GAP: 13 (ref 5–15)
Albumin: 2.7 g/dL — ABNORMAL LOW (ref 3.5–5.0)
BUN: 83 mg/dL — ABNORMAL HIGH (ref 6–20)
CHLORIDE: 111 mmol/L (ref 98–111)
CO2: 21 mmol/L — AB (ref 22–32)
Calcium: 8.7 mg/dL — ABNORMAL LOW (ref 8.9–10.3)
Creatinine, Ser: 11.92 mg/dL — ABNORMAL HIGH (ref 0.61–1.24)
GFR calc Af Amer: 5 mL/min — ABNORMAL LOW (ref >60)
GFR calc non Af Amer: 4 mL/min — ABNORMAL LOW (ref >60)
GLUCOSE: 100 mg/dL — AB (ref 70–99)
POTASSIUM: 5 mmol/L (ref 3.5–5.1)
Phosphorus: 7.1 mg/dL — ABNORMAL HIGH (ref 2.5–4.6)
Sodium: 145 mmol/L (ref 135–145)

## 2018-07-18 LAB — GLUCOSE, CAPILLARY
GLUCOSE-CAPILLARY: 116 mg/dL — AB (ref 70–99)
GLUCOSE-CAPILLARY: 93 mg/dL (ref 70–99)
GLUCOSE-CAPILLARY: 124 mg/dL — AB (ref 70–99)
GLUCOSE-CAPILLARY: 102 mg/dL — AB (ref 70–99)
Glucose-Capillary: 115 mg/dL — ABNORMAL HIGH (ref 70–99)
Glucose-Capillary: 92 mg/dL (ref 70–99)
Glucose-Capillary: 98 mg/dL (ref 70–99)

## 2018-07-18 LAB — MAGNESIUM: Magnesium: 2.1 mg/dL (ref 1.7–2.4)

## 2018-07-18 MED ORDER — SODIUM CHLORIDE 0.9 % IV SOLN
510.0000 mg | Freq: Once | INTRAVENOUS | Status: AC
  Administered 2018-07-18: 510 mg via INTRAVENOUS
  Filled 2018-07-18: qty 17

## 2018-07-18 MED ORDER — DARBEPOETIN ALFA 100 MCG/0.5ML IJ SOSY
100.0000 ug | PREFILLED_SYRINGE | INTRAMUSCULAR | Status: DC
  Administered 2018-07-18 – 2018-08-01 (×3): 100 ug via SUBCUTANEOUS
  Filled 2018-07-18 (×4): qty 0.5

## 2018-07-18 NOTE — Progress Notes (Signed)
Physical Therapy Treatment Patient Details Name: Christopher KaufmannShelton M Hocutt MRN: [id number] DOB: [birth year] Today's Date: 07/18/2018    History of Present Illness Pt is a 57 y.o. 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 CT 7/4 showed bilateral occipital edema and R frontoparietal edema, question anoxic brain injury or posterior reversible encephalopathy syndrome; no hemorrhage or midline shift. CT 7/5 showed no acute intracranial abnormality. ETT 7/4-7/10. Acute renal failure; CRRT stopped 7/12. PMH includes depression.    PT Comments    Pt pleasant, unaware of situation, place or time. Pt following commands with decreased cues today but continues to require mod-max cues for safety, stability and sequence. Pt with use of RW today with increase in BOS without significant scissoring today but mod assist for safety and stability. Gracie present end of session and excited to see pt walk. Sister again educated for BI symptoms and progression as well as reorienting and responding calmly to his change in behavior and cognition. Will continue to follow. Noted family plans for SNF not CIR. Concerned for any possibility of family attempting to transport pt at this time.     Follow Up Recommendations  SNF;Supervision/Assistance - 24 hour(family declined CIR possibility)     Equipment Recommendations  Rolling walker with 5" wheels    Recommendations for Other Services OT consult;Speech consult;Rehab consult     Precautions / Restrictions Precautions Precautions: Fall    Mobility  Bed Mobility Overal bed mobility: Needs Assistance Bed Mobility: Supine to Sit     Supine to sit: Min assist     General bed mobility comments: cues with assist to initiate transfer with moving leg off of bed  Transfers Overall transfer level: Needs assistance   Transfers: Sit to/from Stand Sit to Stand: Min guard;+2 safety/equipment          General transfer comment: no assist to rise but guarding +2 for safety and management of lines  Ambulation/Gait Ambulation/Gait assistance: Mod assist;+2 safety/equipment Gait Distance (Feet): 300 Feet Assistive device: Rolling walker (2 wheeled) Gait Pattern/deviations: Narrow base of support;Step-through pattern;Decreased stride length;Drifts right/left   Gait velocity interpretation: 1.31 - 2.62 ft/sec, indicative of limited community ambulator General Gait Details: pt with use of RW for gait this session with increased stability but continues to require mod assist for safety, balance and directing RW as times. PT with tendency to veer right and left with gait with cues to attend to environment and position in hallway. Pt ditching RW with return to room after running into computer despite cues. +1 for lines and safety throughout   Stairs             Wheelchair Mobility    Modified Rankin (Stroke Patients Only)       Balance Overall balance assessment: Needs assistance Sitting-balance support: Feet supported;No upper extremity supported Sitting balance-Leahy Scale: Fair Sitting balance - Comments: pt able to sit EOB with tendency for propping on forearms, minguard for safety and balance     Standing balance-Leahy Scale: Poor Standing balance comment: bil UE support with assist for balance                            Cognition Arousal/Alertness: Awake/alert Behavior During Therapy: Impulsive;Restless Overall Cognitive Status: Impaired/Different from baseline Area of Impairment: Orientation;Attention;Memory;Following commands;Safety/judgement;Problem solving                 Orientation Level: Disoriented to;Person;Place;Time;Situation Current Attention  Level: Sustained(with max cues) Memory: Decreased recall of precautions;Decreased short-term memory Following Commands: Follows one step commands inconsistently Safety/Judgement: Decreased awareness of  safety;Decreased awareness of deficits   Problem Solving: Slow processing;Decreased initiation;Difficulty sequencing;Requires verbal cues;Requires tactile cues General Comments: pt disoriented, decreased attention and safety. PT following single step commands with redirection and increased time. Easily distracted      Exercises      General Comments        Pertinent Vitals/Pain Pain Assessment: No/denies pain    Home Living                      Prior Function            PT Goals (current goals can now be found in the care plan section) Progress towards PT goals: Progressing toward goals    Frequency           PT Plan Discharge plan needs to be updated    Co-evaluation              AM-PAC PT "6 Clicks" Daily Activity  Outcome Measure  Difficulty turning over in bed (including adjusting bedclothes, sheets and blankets)?: A Little Difficulty moving from lying on back to sitting on the side of the bed? : A Little Difficulty sitting down on and standing up from a chair with arms (e.g., wheelchair, bedside commode, etc,.)?: Unable Help needed moving to and from a bed to chair (including a wheelchair)?: A Lot Help needed walking in hospital room?: A Lot Help needed climbing 3-5 steps with a railing? : A Lot 6 Click Score: 13    End of Session Equipment Utilized During Treatment: Gait belt Activity Tolerance: Patient tolerated treatment well Patient left: in chair;with call bell/phone within reach;with chair alarm set;with restraints reapplied Nurse Communication: Mobility status;Precautions PT Visit Diagnosis: Other abnormalities of gait and mobility (R26.89);Other symptoms and signs involving the nervous system (R29.898);Unsteadiness on feet (R26.81)     Time: 1610-9604 PT Time Calculation (min) (ACUTE ONLY): 24 min  Charges:  $Gait Training: 8-22 mins $Therapeutic Activity: 8-22 mins                    G Codes:       Delaney Meigs,  PT [phone]    Esteen Delpriore B Undine Nealis 07/18/2018, 10:58 AM

## 2018-07-18 NOTE — Progress Notes (Signed)
PROGRESS NOTE    Christopher Dominguez  ZOX:[id number] DOB: [birth year] DOA: 06/30/2018 PCP: Patient, No Pcp Per   Brief Narrative:  57 yo with no significant past medical history, originally from [place] admitted to critical care on 7/4 following Kree Armato cardiac arrest. -Hospitalization complicated by acute kidney injury requiring hemodialysis, chorea post extubation, encephalopathy from anoxic brain injury, cardiomyopathy with EF down to 25% which recovered to 55% range in 3 days on repeat echo. 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  CXR without active cardiopulm dz.  Follow urinalysis and culture (no growth).  Will obtain blood cx if recurrent temp.  Hold off on abx for now.  Afebrile since, follow.  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  Recommending proceeding with rehab and f/u outpatient with Dr. Cristal Deer.    Acute respiratory failure with hypoxemia (HCC) - following cardiac arrest - Extubated 7/10 - Also treated for aspiration pneumonia, completed antibiotics - Continue pulmonary toilet, incentive spirometer, nebs as needed - Currently doing well on room air   Encephalopathy  anoxic brain injury  Post Hypoxic Chroea -seen by neurology, noted to have waxing and  waning mental status (this has continued as I've seen him as well) - delirium precautions -Haldol when necessary (recommended not continuing this long term), gabapentin could be tried as an alternative if movements are interfering with rehab and less sedating alternative needed (gabapentin 300 mg BID -Will need long-term rehabilitation and outpatient neurology follow-up (if persistent deficits and abnormal movements after rehab) -SLP following on dysphagia 2 diet - Therapy recommending CIR, but sounds like family may opt for SNF  Acute renal failure -Due to cardiac arrest, shock -Renal following, was getting CRRT - intermittent HD now, has right temporary IJ HD catheter - Holding dialysis with fall in creatinine and improved UOP.  IVF per nephrology. - 3.9 L UOP yesterday - Will remove dialysis catheter  Nausea  Vomiting: occurred x1 today and Veneta Sliter couple of days ago as well.  Having BM, no tenderness on exam.  Will ctm for now.  Will repeat EKG for QT eval (prolonged initially - now improved) in case he needs antiemetics.  Hematuria/enlarged prostate -Seen by urology -Given very large prostate recommended to avoid Foley unless residuals greater than 500 -continue Flomax   Transient rectal bleeding -Resolved, seen by GI, supportive care for now -Subcutaneous heparin held -> now resumed, continue SCDs for DVT prophylaxis   shock liver -Transaminitis improved -Trend LFTs  Anemia: stable.  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  Will give dose of IV iron.  Renal giving epo.  Hypertension: started on amlodipine today by renal  DVT prophylaxis: heparin Code Status: full  Family Communication: sister at bedside Disposition Plan: pending.  ?inpatient rehab vs  SNF (seems more likely at this point).  Pending decision about dialysis.   Consultants:   Renal  Cards  GI  Urology  Procedures:   R IJ HD catheter CRRT followed by IHD  07/04/18 Study  Conclusions  - Left ventricle: The cavity size was normal. Wall thickness was   normal. Systolic function was normal. The estimated ejection   fraction was in the range of 55% to 60%. Wall motion was normal;   there were no regional wall motion abnormalities. - Aortic root: The aortic root was mildly dilated. - Right ventricle: The cavity size was mildly dilated.  Impressions:  - Normal LV function; mildly dilated aortic root; mild RVE.  07/01/18 Study Conclusions  - Left ventricle: The cavity size was normal. Wall thickness was   increased in Clayton Bosserman pattern of mild LVH. Systolic function was   severely reduced. The estimated ejection fraction was in the   range of 25% to 30%. Diffuse hypokinesis. Doppler parameters are   consistent with abnormal left ventricular relaxation (grade 1   diastolic dysfunction). - Aortic root: The aortic root was mildly dilated. - Mitral valve: Calcified annulus. - Right ventricle: The cavity size was mildly dilated. Systolic   function was severely reduced. - Right atrium: The atrium was mildly dilated.  Impressions:  - Technically difficult; definity used; severe LV dysfunction; mild   LVH; mild diastolic dysfunction; enlarged right side with   moderate to severe RV dysfunction.  7/5 Final Interpretation: Right: There is no evidence of deep vein thrombosis in the lower extremity. No cystic structure found in the popliteal fossa. Left: There is no evidence of deep vein thrombosis in the lower extremity. However, portions of this examination were limited- see technologist comments above. No cystic structure found in the popliteal fossa.  Antimicrobials:  Anti-infectives (From admission, onward)   Start     Dose/Rate Route Frequency Ordered Stop   07/06/18 1200  piperacillin-tazobactam (ZOSYN) IVPB 3.375 g    Note to Pharmacy:  Please stop after today   3.375 g 100 mL/hr over 30 Minutes Intravenous Every 6 hours 07/06/18 1015 07/06/18 1932    07/02/18 1800  piperacillin-tazobactam (ZOSYN) IVPB 3.375 g  Status:  Discontinued     3.375 g 12.5 mL/hr over 240 Minutes Intravenous Every 6 hours 07/02/18 1309 07/02/18 1310   07/02/18 1800  piperacillin-tazobactam (ZOSYN) IVPB 3.375 g  Status:  Discontinued     3.375 g 100 mL/hr over 30 Minutes Intravenous Every 6 hours 07/02/18 1311 07/06/18 1015   07/02/18 1200  piperacillin-tazobactam (ZOSYN) IVPB 2.25 g  Status:  Discontinued     2.25 g 100 mL/hr over 30 Minutes Intravenous Every 6 hours 07/02/18 0728 07/02/18 1309   07/01/18 0100  piperacillin-tazobactam (ZOSYN) IVPB 3.375 g  Status:  Discontinued     3.375 g 12.5 mL/hr over 240 Minutes Intravenous Every 8 hours 06/30/18 1812 07/02/18 0728   06/30/18 1815  piperacillin-tazobactam (ZOSYN) IVPB 3.375 g     3.375 g 100 mL/hr over 30 Minutes Intravenous  Once 06/30/18 1804 06/30/18 1945      Subjective: No complaints.  Still confused.  Objective: Vitals:   07/17/18 1218 07/17/18 2009 07/18/18 0415 07/18/18 1224  BP: (!) 137/98 (!) 155/95 (!) 159/97 (!) 148/106  Pulse:  81 81 78  Resp: 20 20 18 18   Temp: 98.7 F (37.1 C) 98.2 F (36.8 C) 97.7 F (36.5 C) 97.8 F (36.6 C)  TempSrc: Oral Oral Oral Oral  SpO2:  100% 99%  96%  Weight:   88.9 kg (196 lb)   Height:        Intake/Output Summary (Last 24 hours) at 07/18/2018 1639 Last data filed at 07/18/2018 0957 Gross per 24 hour  Intake 1981.59 ml  Output 2700 ml  Net -718.41 ml   Filed Weights   07/16/18 0500 07/17/18 0100 07/18/18 0415  Weight: 91 kg (200 lb 9.9 oz) 91.4 kg (201 lb 8 oz) 88.9 kg (196 lb)    Examination:  General: No acute distress. Cardiovascular: Heart sounds show Hector Taft regular rate, and rhythm.  Lungs: Clear to auscultation bilaterally  Abdomen: Soft, nontender, nondistended Neurological: Alert and disoriented. Moves all extremities 4. Cranial nerves II through XII grossly intact. Skin: Warm and dry. No rashes or lesions. Extremities: No  clubbing or cyanosis. No edema. RIJ catheter  Data Reviewed: I have personally reviewed following labs and imaging studies  CBC: Recent Labs  Lab 07/14/18 0434 07/15/18 0623 07/16/18 0559 07/17/18 0543 07/17/18 1415 07/18/18 0625  WBC 8.0 7.6 7.3 7.7  --  6.3  HGB 8.7* 8.5* 8.2* 7.5* 8.0* 8.5*  HCT 27.8* 27.8* 26.7* 24.1* 26.0* 28.4*  MCV 71.1* 71.5* 71.8* 71.1*  --  72.1*  PLT 445* 480* 479* 476*  --  477*   Basic Metabolic Panel: Recent Labs  Lab 07/14/18 0434 07/15/18 0623 07/16/18 0559 07/17/18 0543 07/18/18 0625  NA 142 142 141 140 145  K 5.2* 5.5* 5.1 4.9 5.0  CL 101 99 103 106 111  CO2 27 27 23  21* 21*  GLUCOSE 98 94 96 106* 100*  BUN 71* 86* 92* 88* 83*  CREATININE 11.44* 14.16* 15.10* 13.83* 11.92*  CALCIUM 8.1* 8.3* 8.1* 8.2* 8.7*  MG 2.4 2.6* 2.4 2.4 2.1  PHOS 5.8* 6.4* 7.0* 6.4* 7.1*   GFR: Estimated Creatinine Clearance: 7.2 mL/min (Trey Bebee) (by C-G formula based on SCr of 11.92 mg/dL (H)). Liver Function Tests: Recent Labs  Lab 07/14/18 0434 07/15/18 0623 07/16/18 0559 07/17/18 0543 07/18/18 0625  AST 21  --   --  17  --   ALT 39  --   --  23  --   ALKPHOS 58  --   --  52  --   BILITOT 0.4  --   --  0.5  --   PROT 6.2*  --   --  5.8*  --   ALBUMIN 2.5*  2.4* 2.5* 2.5* 2.4*  2.4* 2.7*   No results for input(s): LIPASE, AMYLASE in the last 168 hours. No results for input(s): AMMONIA in the last 168 hours. Coagulation Profile: No results for input(s): INR, PROTIME in the last 168 hours. Cardiac Enzymes: No results for input(s): CKTOTAL, CKMB, CKMBINDEX, TROPONINI in the last 168 hours. BNP (last 3 results) No results for input(s): PROBNP in the last 8760 hours. HbA1C: No results for input(s): HGBA1C in the last 72 hours. CBG: Recent Labs  Lab 07/17/18 2012 07/18/18 0035 07/18/18 0408 07/18/18 0833 07/18/18 1222  GLUCAP 125* 98 92 93 115*   Lipid Profile: No results for input(s): CHOL, HDL, LDLCALC, TRIG, CHOLHDL, LDLDIRECT in the  last 72 hours. Thyroid Function Tests: No results for input(s): TSH, T4TOTAL, FREET4, T3FREE, THYROIDAB in the last 72 hours. Anemia Panel: Recent Labs    07/18/18 1028  TIBC 302  IRON 36*   Sepsis Labs: No results for input(s): PROCALCITON, LATICACIDVEN in the last 168 hours.  Recent Results (from the past 240 hour(s))  Culture, Urine     Status: None  Collection Time: 07/15/18  4:27 PM  Result Value Ref Range Status   Specimen Description URINE, CLEAN CATCH  Final   Special Requests NONE  Final   Culture   Final    NO GROWTH Performed at [facility], [address] [place], [place] [address]    Report Status 07/16/2018 FINAL  Final         Radiology Studies: No results found.      Scheduled Meds: . amLODipine  5 mg Oral Daily  . chlorhexidine  15 mL Mouth Rinse BID  . Chlorhexidine Gluconate Cloth  6 each Topical Q0600  . Chlorhexidine Gluconate Cloth  6 each Topical Q0600  . Chlorhexidine Gluconate Cloth  6 each Topical Q0600  . Chlorhexidine Gluconate Cloth  6 each Topical Q0600  . Chlorhexidine Gluconate Cloth  6 each Topical Q0600  . darbepoetin (ARANESP) injection - NON-DIALYSIS  100 mcg Subcutaneous Q Mon-1800  . feeding supplement (NEPRO CARB STEADY)  237 mL Oral BID BM  . ferrous sulfate  325 mg Oral Q breakfast  . heparin injection (subcutaneous)  5,000 Units Subcutaneous Q8H  . pantoprazole  40 mg Oral Q1200  . tamsulosin  0.4 mg Oral Daily   Continuous Infusions: . sodium chloride 1,000 mL (07/18/18 1055)     LOS: 18 days    Time spent: over 30 min    Lacretia Nicks, MD [facility] Pager [phone]  If 7PM-7AM, please contact night-coverage [email or web address] Password [facility] 07/18/2018, 4:39 PM

## 2018-07-18 NOTE — Progress Notes (Signed)
[facility] ROUNDING NOTE   Subjective:   Interval History: being bathed this morning  Wife at bedside  No complaints     Urine output 3000 cc   7/21   Objective:  Vital signs in last 24 hours:  Temp:  [97.7 F (36.5 C)-98.7 F (37.1 C)] 97.7 F (36.5 C) (07/22 0415) Pulse Rate:  [81] 81 (07/22 0415) Resp:  [18-20] 18 (07/22 0415) BP: (137-159)/(95-98) 159/97 (07/22 0415) SpO2:  [99 %-100 %] 99 % (07/22 0415) Weight:  [196 lb (88.9 kg)] 196 lb (88.9 kg) (07/22 0415)  Weight change: -5 lb 8 oz (-2.495 kg) Filed Weights   07/16/18 0500 07/17/18 0100 07/18/18 0415  Weight: 200 lb 9.9 oz (91 kg) 201 lb 8 oz (91.4 kg) 196 lb (88.9 kg)    Intake/Output: I/O last 3 completed shifts: In: 1981.6 [P.O.:540; I.V.:1441.6] Out: 4850 [Urine:4850]   Intake/Output this shift:  No intake/output data recorded.  Alert and cooperative   CVS- RRR   JVP  Not elevated at this time  RS- CTA  Bilaterally  ABD- BS present soft non-distended EXT- no edema   Basic Metabolic Panel: Recent Labs  Lab 07/14/18 0434 07/15/18 0623 07/16/18 0559 07/17/18 0543 07/18/18 0625  NA 142 142 141 140 145  K 5.2* 5.5* 5.1 4.9 5.0  CL 101 99 103 106 111  CO2 27 27 23  21* 21*  GLUCOSE 98 94 96 106* 100*  BUN 71* 86* 92* 88* 83*  CREATININE 11.44* 14.16* 15.10* 13.83* 11.92*  CALCIUM 8.1* 8.3* 8.1* 8.2* 8.7*  MG 2.4 2.6* 2.4 2.4 2.1  PHOS 5.8* 6.4* 7.0* 6.4* 7.1*    Liver Function Tests: Recent Labs  Lab 07/14/18 0434 07/15/18 0623 07/16/18 0559 07/17/18 0543 07/18/18 0625  AST 21  --   --  17  --   ALT 39  --   --  23  --   ALKPHOS 58  --   --  52  --   BILITOT 0.4  --   --  0.5  --   PROT 6.2*  --   --  5.8*  --   ALBUMIN 2.5*  2.4* 2.5* 2.5* 2.4*  2.4* 2.7*   No results for input(s): LIPASE, AMYLASE in the last 168 hours. No results for input(s): AMMONIA in the last 168 hours.  CBC: Recent Labs  Lab 07/14/18 0434 07/15/18 0623 07/16/18 0559 07/17/18 0543  07/17/18 1415 07/18/18 0625  WBC 8.0 7.6 7.3 7.7  --  6.3  HGB 8.7* 8.5* 8.2* 7.5* 8.0* 8.5*  HCT 27.8* 27.8* 26.7* 24.1* 26.0* 28.4*  MCV 71.1* 71.5* 71.8* 71.1*  --  72.1*  PLT 445* 480* 479* 476*  --  477*    Cardiac Enzymes: No results for input(s): CKTOTAL, CKMB, CKMBINDEX, TROPONINI in the last 168 hours.  BNP: Invalid input(s): POCBNP  CBG: Recent Labs  Lab 07/17/18 1646 07/17/18 2012 07/18/18 0035 07/18/18 0408 07/18/18 0833  GLUCAP 100* 125* 98 92 93    Microbiology: Results for orders placed or performed during the hospital encounter of 06/30/18  MRSA PCR Screening     Status: None   Collection Time: 06/30/18  5:42 PM  Result Value Ref Range Status   MRSA by PCR NEGATIVE NEGATIVE Final    Comment:        The GeneXpert MRSA Assay (FDA approved for NASAL specimens only), is one component of a comprehensive MRSA colonization surveillance program. It is not intended to diagnose MRSA infection nor  to guide or monitor treatment for MRSA infections. Performed at [facility], [address] [place], [place] [address]   C difficile quick scan w PCR reflex     Status: None   Collection Time: 06/30/18  8:33 PM  Result Value Ref Range Status   C Diff antigen NEGATIVE NEGATIVE Final   C Diff toxin NEGATIVE NEGATIVE Final   C Diff interpretation No C. difficile detected.  Final  Culture, Urine     Status: None   Collection Time: 07/15/18  4:27 PM  Result Value Ref Range Status   Specimen Description URINE, CLEAN CATCH  Final   Special Requests NONE  Final   Culture   Final    NO GROWTH Performed at [facility], [address] [place], [place] [address]    Report Status 07/16/2018 FINAL  Final    Coagulation Studies: No results for input(s): LABPROT, INR in the last 72 hours.  Urinalysis: Recent Labs    07/15/18 1640  COLORURINE STRAW*  LABSPEC 1.006  PHURINE 9.0*  GLUCOSEU NEGATIVE  HGBUR SMALL*  BILIRUBINUR NEGATIVE  KETONESUR  NEGATIVE  PROTEINUR 30*  NITRITE NEGATIVE  LEUKOCYTESUR TRACE*      Imaging: No results found.   Medications:   . sodium chloride 125 mL/hr at 07/18/18 0247   . amLODipine  5 mg Oral Daily  . chlorhexidine  15 mL Mouth Rinse BID  . Chlorhexidine Gluconate Cloth  6 each Topical Q0600  . Chlorhexidine Gluconate Cloth  6 each Topical Q0600  . Chlorhexidine Gluconate Cloth  6 each Topical Q0600  . Chlorhexidine Gluconate Cloth  6 each Topical Q0600  . Chlorhexidine Gluconate Cloth  6 each Topical Q0600  . feeding supplement (NEPRO CARB STEADY)  237 mL Oral BID BM  . ferrous sulfate  325 mg Oral Q breakfast  . heparin injection (subcutaneous)  5,000 Units Subcutaneous Q8H  . pantoprazole  40 mg Oral Q1200  . tamsulosin  0.4 mg Oral Daily   alteplase, heparin, heparin, heparin, heparin, heparin, heparin, heparin, heparin, hydrALAZINE, lidocaine (PF), lidocaine-prilocaine, lip balm, pentafluoroprop-tetrafluoroeth  Assessment/ Plan:   Non oliguric acute kidney injury  Secondary to cardiac arrest   S/p CRRT and has had HD prn   Last dialysis session 7/17  Appears to have recovery of renal function with a decrease in serum creatinine.Baseline creatinine 1.23  ( 5/17)    Continue to hold on dialysis at this point - would recommend removal of dialysis catheter   Volume status  Now no edema and appears to have been weaned to room air  - no issues with volume overload appears to be stable   Cardiac arrest  - myoview study showed septal and inferior wall inducible ischemia - history of illicit drug use and no plans to pursue cath at this time  Seen by Dr Cristal Deer and will follow up after discharge   EF improved to 55%   Anoxic brain injury   Will need rehabilitation  Appears stable although waxes and wanes   BPH  Seen by urology  No obstruction  Continue on flomax   Anemia  Hemoglobin fairly stable   8.5     Could check some iron studies and consider IV iron also start Darbepoietin ( no  transfusions )    LOS: 18 Christopher Dominguez W @TODAY @10 :01 AM

## 2018-07-18 NOTE — Clinical Social Work Note (Signed)
CSW tried calling patient's son. Voicemail not set up. Will discuss SNF when he calls back.  [name], CSW [phone]

## 2018-07-19 LAB — CBC
HCT: 28.8 % — ABNORMAL LOW (ref 39.0–52.0)
HEMOGLOBIN: 8.6 g/dL — AB (ref 13.0–17.0)
MCH: 21.6 pg — AB (ref 26.0–34.0)
MCHC: 29.9 g/dL — AB (ref 30.0–36.0)
MCV: 72.4 fL — ABNORMAL LOW (ref 78.0–100.0)
PLATELETS: 492 10*3/uL — AB (ref 150–400)
RBC: 3.98 MIL/uL — AB (ref 4.22–5.81)
RDW: 15.7 % — ABNORMAL HIGH (ref 11.5–15.5)
WBC: 6.4 10*3/uL (ref 4.0–10.5)

## 2018-07-19 LAB — RENAL FUNCTION PANEL
ALBUMIN: 2.6 g/dL — AB (ref 3.5–5.0)
ANION GAP: 11 (ref 5–15)
BUN: 69 mg/dL — ABNORMAL HIGH (ref 6–20)
CALCIUM: 9 mg/dL (ref 8.9–10.3)
CO2: 21 mmol/L — AB (ref 22–32)
CREATININE: 8.97 mg/dL — AB (ref 0.61–1.24)
Chloride: 114 mmol/L — ABNORMAL HIGH (ref 98–111)
GFR calc non Af Amer: 6 mL/min — ABNORMAL LOW (ref >60)
GFR, EST AFRICAN AMERICAN: 7 mL/min — AB (ref >60)
GLUCOSE: 100 mg/dL — AB (ref 70–99)
PHOSPHORUS: 6.5 mg/dL — AB (ref 2.5–4.6)
Potassium: 4.6 mmol/L (ref 3.5–5.1)
SODIUM: 146 mmol/L — AB (ref 135–145)

## 2018-07-19 LAB — GLUCOSE, CAPILLARY
GLUCOSE-CAPILLARY: 105 mg/dL — AB (ref 70–99)
GLUCOSE-CAPILLARY: 98 mg/dL (ref 70–99)
GLUCOSE-CAPILLARY: 122 mg/dL — AB (ref 70–99)
GLUCOSE-CAPILLARY: 111 mg/dL — AB (ref 70–99)
GLUCOSE-CAPILLARY: 130 mg/dL — AB (ref 70–99)
Glucose-Capillary: 93 mg/dL (ref 70–99)

## 2018-07-19 LAB — MAGNESIUM: Magnesium: 2.2 mg/dL (ref 1.7–2.4)

## 2018-07-19 MED ORDER — DEXTROSE-NACL 5-0.45 % IV SOLN
INTRAVENOUS | Status: DC
  Administered 2018-07-19 – 2018-07-20 (×3): via INTRAVENOUS

## 2018-07-19 MED ORDER — AMLODIPINE BESYLATE 10 MG PO TABS
10.0000 mg | ORAL_TABLET | Freq: Every day | ORAL | Status: DC
Start: 2018-07-20 — End: 2018-08-13
  Administered 2018-07-20 – 2018-08-12 (×24): 10 mg via ORAL
  Filled 2018-07-19 (×24): qty 1

## 2018-07-19 MED ORDER — FERUMOXYTOL INJECTION 510 MG/17 ML
510.0000 mg | Freq: Once | INTRAVENOUS | Status: AC
  Administered 2018-07-21: 510 mg via INTRAVENOUS
  Filled 2018-07-19 (×2): qty 17

## 2018-07-19 MED ORDER — SODIUM CHLORIDE 0.9 % IV SOLN: 125.0000 mg | Freq: Every day | INTRAVENOUS | Status: DC

## 2018-07-19 NOTE — Progress Notes (Signed)
Nutrition Follow-up  DOCUMENTATION CODES:   Not applicable  INTERVENTION:   -Continue Nepro Shake po BID, each supplement provides 425 kcal and 19 grams protein  NUTRITION DIAGNOSIS:   Inadequate oral intake related to lethargy/confusion as evidenced by meal completion < 25%.  Progressing  GOAL:   Patient will meet greater than or equal to 90% of their needs  Progressing   MONITOR:   Supplement acceptance, Diet advancement, PO intake, I & O's  REASON FOR ASSESSMENT:   Consult Enteral/tube feeding initiation and management  ASSESSMENT:   Pt with unknown PMH admitted after OOH arrest.   7/18- s/p BSE- advanced to regular diet  Reviewed I/O's: -3.2 L x 24 hours and -5.6 L since admission  Pt resting quietly at time of visit and about to receive nursing care upon RD arrival.   Pt continues with good appetite; noted meal completion 25-100%. Pt is also consuming Ensure supplements. Reviewed labs; will continue with Nepro foe now due to elevated phosphorus.   Reviewed nephrology notes; pt with renal recovery and recommending removal of HD cath.   Labs reviewed: CBGS: 93-105, Na: 146, Phos: 6.5. K WDL.   Diet Order:   Diet Order           Diet regular Room service appropriate? Yes; Fluid consistency: Thin  Diet effective now          EDUCATION NEEDS:   No education needs have been identified at this time  Skin:  Skin Assessment: Reviewed RN Assessment  Last BM:  07/17/18  Height:   Ht Readings from Last 1 Encounters:  07/05/18 5\' 11"  (1.803 m)    Weight:   Wt Readings from Last 1 Encounters:  07/19/18 195 lb 5.2 oz (88.6 kg)    Ideal Body Weight:  78.1 kg  BMI:  Body mass index is 27.24 kg/m.  Estimated Nutritional Needs:   Kcal:  2200-2400  Protein:  105-125 grams  Fluid:  2 L/day    Aliani Caccavale A. Mayford KnifeWilliams, RD, LDN, CDE Pager: [phone] After hours Pager: [phone]

## 2018-07-19 NOTE — Progress Notes (Signed)
PROGRESS NOTE    Christopher Dominguez  MVH:[id number] DOB: Feb 25, 1961 DOA: 06/30/2018 PCP: Patient, No Pcp Per   Brief Narrative:  57 yo with no significant past medical history, originally from [place] admitted to critical care on 7/4 following [name] cardiac arrest. -Hospitalization complicated by acute kidney injury requiring hemodialysis, chorea post extubation, encephalopathy from anoxic brain injury, cardiomyopathy with EF down to 25% which recovered to 55% range in 3 days on repeat echo. -Transferred from PCCM to hospitalist service 7/16  He's now s/p stress test which was positive by radiology read, but cardiology disagreed with rad interpretation and recommending outpatient follow up.  His kidney function is improving and his temporary dialysis catheter has been removed.  He continues to have waxing/waning confusion.  Will likely be appropriate for SNF placement within [name] few days.    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  CXR without active cardiopulm dz.  Follow urinalysis and culture (no growth).  Will obtain blood cx if recurrent temp.  Hold off on abx for now.  Afebrile since, follow.  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  Recommending proceeding with rehab and f/u outpatient with Dr. Cristal Deer.    Acute  respiratory failure with hypoxemia (HCC) - following cardiac arrest - Extubated 7/10 - Also treated for aspiration pneumonia, completed antibiotics - Continue pulmonary toilet, incentive spirometer, nebs as needed - Currently doing well on room air   Encephalopathy  anoxic brain injury  Post Hypoxic Chroea -seen by neurology, noted to have waxing and waning mental status (this has continued as I've seen him as well) - delirium precautions -Haldol when necessary (recommended not continuing this long term), gabapentin could be tried as an alternative if movements are interfering with rehab and less sedating alternative needed (gabapentin 300 mg BID -Will need long-term rehabilitation and outpatient neurology follow-up (if persistent deficits and abnormal movements after rehab) -SLP following on dysphagia 2 diet - Therapy recommending CIR, but sounds like family may opt for SNF  Acute renal failure -Due to cardiac arrest, shock -Renal following, was getting CRRT - intermittent HD now, has right temporary IJ HD catheter - Holding dialysis with fall in creatinine and improved UOP.  IVF per nephrology (pt developing hypernatremia, IVF increased to 150 - previously on 1/2 NS 125 cc/hr). - 3.4 L UOP yesterday - Dialysis catheter removed 7/23  Nausea  Vomiting: occurred x1 today and [name] couple of days ago as well.  Having BM, no tenderness on exam.  Will ctm for now.  Will repeat EKG for QT eval (prolonged initially - now improved) in case he needs antiemetics.  Hematuria/enlarged prostate -Seen by urology -Given very large prostate recommended to avoid Foley unless residuals greater than 500 -continue Flomax   Transient rectal bleeding -Resolved, seen by GI, supportive care for  now -Subcutaneous heparin held -> now resumed, continue SCDs for DVT prophylaxis   shock liver -Transaminitis improved -Trend LFTs  Anemia: stable.  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   Feraheme given 7/22 and 2nd dose ordered by renal for 7/25.  Renal giving epo.  Hypertension: increase amlodipine to 10 mg   DVT prophylaxis: heparin Code Status: full  Family Communication: sister at bedside Disposition Plan: likely to d/c to SNF soon with continued improvement in renal function    Consultants:   Renal  Cards  GI  Urology  Procedures:   R IJ HD catheter CRRT followed by IHD  07/04/18 Study Conclusions  - Left ventricle: The cavity size was normal. Wall thickness was   normal. Systolic function was normal. The estimated ejection   fraction was in the range of 55% to 60%. Wall motion was normal;   there were no regional wall motion abnormalities. - Aortic root: The aortic root was mildly dilated. - Right ventricle: The cavity size was mildly dilated.  Impressions:  - Normal LV function; mildly dilated aortic root; mild RVE.  07/01/18 Study Conclusions  - Left ventricle: The cavity size was normal. Wall thickness was   increased in [name] pattern of mild LVH. Systolic function was   severely reduced. The estimated ejection fraction was in the   range of 25% to 30%. Diffuse hypokinesis. Doppler parameters are   consistent with abnormal left ventricular relaxation (grade 1   diastolic dysfunction). - Aortic root: The aortic root was mildly dilated. - Mitral valve: Calcified annulus. - Right ventricle: The cavity size was mildly dilated. Systolic   function was severely reduced. - Right atrium: The atrium was mildly dilated.  Impressions:  - Technically difficult; definity used; severe LV dysfunction; mild   LVH; mild diastolic dysfunction; enlarged right side with   moderate to severe RV dysfunction.  7/5 Final Interpretation: Right: There is no evidence of deep vein thrombosis in the lower extremity. No cystic structure found in the popliteal fossa. Left: There is no evidence of deep vein thrombosis in the lower extremity. However, portions  of this examination were limited- see technologist comments above. No cystic structure found in the popliteal fossa.  Antimicrobials:  Anti-infectives (From admission, onward)   Start     Dose/Rate Route Frequency Ordered Stop   07/06/18 1200  piperacillin-tazobactam (ZOSYN) IVPB 3.375 g    Note to Pharmacy:  Please stop after today   3.375 g 100 mL/hr over 30 Minutes Intravenous Every 6 hours 07/06/18 1015 07/06/18 1932   07/02/18 1800  piperacillin-tazobactam (ZOSYN) IVPB 3.375 g  Status:  Discontinued     3.375 g 12.5 mL/hr over 240 Minutes Intravenous Every 6 hours 07/02/18 1309 07/02/18 1310   07/02/18 1800  piperacillin-tazobactam (ZOSYN) IVPB 3.375 g  Status:  Discontinued     3.375 g 100 mL/hr over 30 Minutes Intravenous Every 6 hours 07/02/18 1311 07/06/18 1015   07/02/18 1200  piperacillin-tazobactam (ZOSYN) IVPB 2.25 g  Status:  Discontinued     2.25 g 100 mL/hr over 30 Minutes Intravenous Every 6 hours 07/02/18 0728 07/02/18 1309   07/01/18 0100  piperacillin-tazobactam (ZOSYN) IVPB 3.375 g  Status:  Discontinued     3.375 g 12.5 mL/hr over 240 Minutes Intravenous Every 8 hours 06/30/18 1812 07/02/18 0728   06/30/18 1815  piperacillin-tazobactam (ZOSYN) IVPB 3.375 g     3.375 g 100 mL/hr over 30 Minutes Intravenous  Once 06/30/18 1804 06/30/18 1945  Subjective: Denies pain. Confused. Can tell me his name and birthday. Sister says he's waxing/waning.  Objective: Vitals:   07/18/18 2003 07/19/18 0342 07/19/18 0414 07/19/18 1137  BP: (!) 148/103 (!) 162/104 (!) 164/92 (!) 159/102  Pulse: 88 84  91  Resp: 18 18    Temp: 98 F (36.7 C) 98.3 F (36.8 C)  99 F (37.2 C)  TempSrc: Oral Oral  Oral  SpO2: 96% 96%  96%  Weight:  88.6 kg (195 lb 5.2 oz)    Height:        Intake/Output Summary (Last 24 hours) at 07/19/2018 1612 Last data filed at 07/19/2018 1500 Gross per 24 hour  Intake 166 ml  Output 6750 ml  Net -6584 ml   Filed Weights   07/17/18 0100  07/18/18 0415 07/19/18 0342  Weight: 91.4 kg (201 lb 8 oz) 88.9 kg (196 lb) 88.6 kg (195 lb 5.2 oz)    Examination:  General: No acute distress. Cardiovascular: Heart sounds show Khayden Herzberg regular rate, and rhythm.  Lungs: Clear to auscultation bilaterally with good air movement.  Abdomen: Soft, nontender, nondistended Neurological: Alert and disoriented. Moves all extremities 4. Cranial nerves II through XII grossly intact. Skin: Warm and dry. No rashes or lesions. Extremities: No clubbing or cyanosis. No edema. Pedal pulses 2+. Psychiatric: Insight and judgment are impaired. RIJ catheter removed   Data Reviewed: I have personally reviewed following labs and imaging studies  CBC: Recent Labs  Lab 07/15/18 0623 07/16/18 0559 07/17/18 0543 07/17/18 1415 07/18/18 0625 07/19/18 0715  WBC 7.6 7.3 7.7  --  6.3 6.4  HGB 8.5* 8.2* 7.5* 8.0* 8.5* 8.6*  HCT 27.8* 26.7* 24.1* 26.0* 28.4* 28.8*  MCV 71.5* 71.8* 71.1*  --  72.1* 72.4*  PLT 480* 479* 476*  --  477* 492*   Basic Metabolic Panel: Recent Labs  Lab 07/15/18 0623 07/16/18 0559 07/17/18 0543 07/18/18 0625 07/19/18 0715  NA 142 141 140 145 146*  K 5.5* 5.1 4.9 5.0 4.6  CL 99 103 106 111 114*  CO2 27 23 21* 21* 21*  GLUCOSE 94 96 106* 100* 100*  BUN 86* 92* 88* 83* 69*  CREATININE 14.16* 15.10* 13.83* 11.92* 8.97*  CALCIUM 8.3* 8.1* 8.2* 8.7* 9.0  MG 2.6* 2.4 2.4 2.1 2.2  PHOS 6.4* 7.0* 6.4* 7.1* 6.5*   GFR: Estimated Creatinine Clearance: 9.6 mL/min (Telitha Plath) (by C-G formula based on SCr of 8.97 mg/dL (H)). Liver Function Tests: Recent Labs  Lab 07/14/18 0434 07/15/18 0623 07/16/18 0559 07/17/18 0543 07/18/18 0625 07/19/18 0715  AST 21  --   --  17  --   --   ALT 39  --   --  23  --   --   ALKPHOS 58  --   --  52  --   --   BILITOT 0.4  --   --  0.5  --   --   PROT 6.2*  --   --  5.8*  --   --   ALBUMIN 2.5*  2.4* 2.5* 2.5* 2.4*  2.4* 2.7* 2.6*   No results for input(s): LIPASE, AMYLASE in the last 168  hours. No results for input(s): AMMONIA in the last 168 hours. Coagulation Profile: No results for input(s): INR, PROTIME in the last 168 hours. Cardiac Enzymes: No results for input(s): CKTOTAL, CKMB, CKMBINDEX, TROPONINI in the last 168 hours. BNP (last 3 results) No results for input(s): PROBNP in the last 8760 hours. HbA1C: No results for input(s):  HGBA1C in the last 72 hours. CBG: Recent Labs  Lab 07/19/18 0230 07/19/18 0443 07/19/18 0743 07/19/18 1134 07/19/18 1609  GLUCAP 105* 93 98 122* 111*   Lipid Profile: No results for input(s): CHOL, HDL, LDLCALC, TRIG, CHOLHDL, LDLDIRECT in the last 72 hours. Thyroid Function Tests: No results for input(s): TSH, T4TOTAL, FREET4, T3FREE, THYROIDAB in the last 72 hours. Anemia Panel: Recent Labs    07/18/18 1028  TIBC 302  IRON 36*   Sepsis Labs: No results for input(s): PROCALCITON, LATICACIDVEN in the last 168 hours.  Recent Results (from the past 240 hour(s))  Culture, Urine     Status: None   Collection Time: 07/15/18  4:27 PM  Result Value Ref Range Status   Specimen Description URINE, CLEAN CATCH  Final   Special Requests NONE  Final   Culture   Final    NO GROWTH Performed at [facility], [address] [place], [place] [address]    Report Status 07/16/2018 FINAL  Final         Radiology Studies: No results found.      Scheduled Meds: . amLODipine  5 mg Oral Daily  . chlorhexidine  15 mL Mouth Rinse BID  . Chlorhexidine Gluconate Cloth  6 each Topical Q0600  . Chlorhexidine Gluconate Cloth  6 each Topical Q0600  . Chlorhexidine Gluconate Cloth  6 each Topical Q0600  . Chlorhexidine Gluconate Cloth  6 each Topical Q0600  . Chlorhexidine Gluconate Cloth  6 each Topical Q0600  . darbepoetin (ARANESP) injection - NON-DIALYSIS  100 mcg Subcutaneous Q Mon-1800  . feeding supplement (NEPRO CARB STEADY)  237 mL Oral BID BM  . ferrous sulfate  325 mg Oral Q breakfast  . heparin injection  (subcutaneous)  5,000 Units Subcutaneous Q8H  . pantoprazole  40 mg Oral Q1200  . tamsulosin  0.4 mg Oral Daily   Continuous Infusions: . dextrose 5 % and 0.45% NaCl 75 mL/hr at 07/19/18 1151  . [START ON 07/21/2018] ferumoxytol       LOS: 19 days    Time spent: over 30 min    Lacretia Nicks, MD [facility] Pager [phone]  If 7PM-7AM, please contact night-coverage [email or web address] Password [facility] 07/19/2018, 4:12 PM

## 2018-07-19 NOTE — Clinical Social Work Note (Signed)
Tried calling son to discuss SNF. Unable to leave voicemail.  Charlynn CourtSarah Nickalas Mccarrick, CSW [phone]

## 2018-07-19 NOTE — Progress Notes (Signed)
Occupational Therapy Treatment Patient Details Name: Christopher Dominguez MRN: [id number] DOB: 08/19/1961 Today's Date: 07/19/2018    History of present illness Pt is a 57 y.o. 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 CT 7/4 showed bilateral occipital edema and R frontoparietal edema, question anoxic brain injury or posterior reversible encephalopathy syndrome; no hemorrhage or midline shift. CT 7/5 showed no acute intracranial abnormality. ETT 7/4-7/10. Acute renal failure; CRRT stopped 7/12. PMH includes depression.   OT comments  Pt progressing towards established OT goals. However, continues to present with decreased cognition, strength, and balance. Pt demonstrating increased attention (lasting ~10-20 seconds)  to sit EOB and adjust socks.Pt requiring Max cues for grooming task at sink and Mod A for standing balance. Pt continues to present with impulsivity and poor safety awareness. Continue to recommend dc to post-acute rehab, however, updating recommendation to SNF. Will continue to follow acutely as admitted.    Follow Up Recommendations  SNF    Equipment Recommendations  Other (comment)(Defer to next venue)    Recommendations for Other Services      Precautions / Restrictions Precautions Precautions: Fall Restrictions Weight Bearing Restrictions: No       Mobility Bed Mobility Overal bed mobility: Needs Assistance Bed Mobility: Supine to Sit     Supine to sit: Min guard     General bed mobility comments: Cues to initate transfe to EOB. Min Guard A for safety.  Transfers Overall transfer level: Needs assistance Equipment used: Rolling walker (2 wheeled) Transfers: Sit to/from Stand Sit to Stand: Min guard;+2 safety/equipment         General transfer comment: Min Guard A for safety    Balance Overall balance assessment: Needs assistance Sitting-balance support: Feet supported;No upper  extremity supported Sitting balance-Leahy Scale: Fair Sitting balance - Comments: pt able to sit EOB with tendency for propping on forearms, minguard for safety and balance Postural control: Right lateral lean   Standing balance-Leahy Scale: Poor Standing balance comment: bil UE support with assist for balance                           ADL either performed or assessed with clinical judgement   ADL Overall ADL's : Needs assistance/impaired     Grooming: Wash/dry face;Moderate assistance;Standing;Applying deodorant Grooming Details (indicate cue type and reason): Mod A for standing balance. Max cues to initate tasks. Pt performing tasks but looking all around the room, talking about different random topics, and moving arms and hands througout task.              Lower Body Dressing: Moderate assistance;Min guard;Sit to/from stand Lower Body Dressing Details (indicate cue type and reason): Pt bringing his ankles to knees upon cues to adjust socks. Min guard A for safety at EOB. Mod A for standing balance Toilet Transfer: Moderate assistance;Ambulation(Simulated to recliner) Toilet Transfer Details (indicate cue type and reason): Mod A for balance and safety.          Functional mobility during ADLs: Moderate assistance;+2 for physical assistance;Cueing for sequencing;+2 for safety/equipment;Cueing for safety General ADL Comments: pt highly distractable, required max multimodal cues for redirection to task     Vision       Perception     Praxis      Cognition Arousal/Alertness: Awake/alert Behavior During Therapy: Impulsive;Restless Overall Cognitive Status: Impaired/Different from baseline Area of Impairment: Orientation;Attention;Memory;Following commands;Safety/judgement;Problem solving  Orientation Level: Disoriented to;Person;Place;Time;Situation Current Attention Level: Focused Memory: Decreased recall of precautions;Decreased  short-term memory Following Commands: Follows one step commands inconsistently Safety/Judgement: Decreased awareness of safety;Decreased awareness of deficits   Problem Solving: Slow processing;Decreased initiation;Difficulty sequencing;Requires verbal cues;Requires tactile cues General Comments: Pt with decreased attention and safety throughout session. Able to state his name. Not oriented to time and place. When asked where he was, he looked around the room and stated "[address]".         Exercises     Shoulder Instructions       General Comments Sister present at begining of session    Pertinent Vitals/ Pain       Pain Assessment: Faces Faces Pain Scale: No hurt Pain Intervention(s): Monitored during session  Home Living                                          Prior Functioning/Environment              Frequency  Min 2X/week        Progress Toward Goals  OT Goals(current goals can now be found in the care plan section)  Progress towards OT goals: Progressing toward goals  Acute Rehab OT Goals Patient Stated Goal: Pt unable to participate in goal setting  OT Goal Formulation: Patient unable to participate in goal setting Time For Goal Achievement: 07/25/18 Potential to Achieve Goals: Good ADL Goals Pt Will Perform Eating: with modified independence(with <3 cues for redirection in mod distracting environment) Pt Will Perform Grooming: with min assist;standing Pt Will Perform Upper Body Bathing: with supervision;sitting Pt Will Perform Lower Body Bathing: with min assist;sit to/from stand Pt Will Perform Lower Body Dressing: with modified independence;sit to/from stand Pt Will Transfer to Toilet: with modified independence;ambulating Additional ADL Goal #1: Pt will be oriented x 4 with min cues and use of external cues Additional ADL Goal #2: Pt will attend to simple ADL task x 2 mins with no more than 3 cues.  Plan Discharge plan needs to be  updated    Co-evaluation                 AM-PAC PT "6 Clicks" Daily Activity     Outcome Measure   Help from another person eating meals?: A Little Help from another person taking care of personal grooming?: A Lot Help from another person toileting, which includes using toliet, bedpan, or urinal?: A Lot Help from another person bathing (including washing, rinsing, drying)?: A Lot Help from another person to put on and taking off regular upper body clothing?: A Lot Help from another person to put on and taking off regular lower body clothing?: A Lot 6 Click Score: 13    End of Session Equipment Utilized During Treatment: Gait belt  OT Visit Diagnosis: Unsteadiness on feet (R26.81);Other abnormalities of gait and mobility (R26.89);Muscle weakness (generalized) (M62.81);Cognitive communication deficit (R41.841)   Activity Tolerance Patient tolerated treatment well   Patient Left in chair;with call bell/phone within reach;with restraints reapplied;with chair alarm set   Nurse Communication Mobility status;Other (comment)(Pt in chair with restraints and alarm in place)        Time: 1437-1501 OT Time Calculation (min): 24 min  Charges: OT General Charges $OT Visit: 1 Visit OT Treatments $Self Care/Home Management : 23-37 mins  Tenna Lacko MSOT, OTR/L Acute Rehab Pager: [phone] Office: [phone]  Theodoro GristCharis M Aryn Kops 07/19/2018, 5:15 PM

## 2018-07-19 NOTE — Clinical Social Work Note (Signed)
Clinical Social Work Assessment  Patient Details  Name: Christopher Dominguez MRN: [id number] Date of Birth: July 29, 1961  Date of referral:  07/19/18               Reason for consult:  Facility Placement, Discharge Planning                Permission sought to share information with:  Facility Medical sales representativeContact Representative, Family Supports Permission granted to share information::     Name::     Sharmon LeydenShelton Dominguez  Agency::  SNF's  Relationship::  Son  Contact Information:  [phone]  Housing/Transportation Living arrangements for the past 2 months:  Single Family Home Source of Information:  Medical Team, Adult Children Patient Interpreter Needed:  None Criminal Activity/Legal Involvement Pertinent to Current Situation/Hospitalization:  No - Comment as needed Significant Relationships:  Adult Children, Siblings Lives with:  Self Do you feel safe going back to the place where you live?  No Need for family participation in patient care:  Yes (Comment)  Care giving concerns:  PT recommending SNF once medically stable for discharge.   Social Worker assessment / plan:  Patient not oriented. CSW called patient's son, introduced role, and explained that PT recommendations would be discussed. Patient's [place] agreeable [name]. [facility] preference is [facility]. Patient's son will transport him to the facility once discharged. CSW will work up referral and contact facility admissions coordinator tomorrow morning. Patient's son plans on flying here on Thursday. No further concerns. CSW encouraged patient's son to contact CSW as needed. CSW will continue to follow patient and his son for support and facilitate discharge to SNF once medically stable.  Employment status:  Disabled (Comment on whether or not currently receiving Disability) Insurance information:  Teacher, English as a foreign languageManaged Medicare, [place] Recommendations:  Skilled Nursing Facility Information / Referral to community  resources:  Skilled Nursing Facility  Patient/Family's Response to care:  Patient not oriented. Patient's son agreeable to SNF placement. Patient's family supportive and involved in patient's care. Patient's son appreciated social work intervention.  Patient/Family's Understanding of and Emotional Response to Diagnosis, Current Treatment, and Prognosis:  Patient not oriented. Patient's son has a good understanding of the reason for admission and his need for SNF after discharge. Patient's son appears happy with hospital care.  Emotional Assessment Appearance:  Appears stated age Attitude/Demeanor/Rapport:  Unable to Assess Affect (typically observed):  Unable to Assess Orientation:  (Disoriented x 4) Alcohol / Substance use:  Illicit Drugs Psych involvement (Current and /or in the community):  No (Comment)  Discharge Needs  Concerns to be addressed:  Care Coordination Readmission within the last 30 days:  No Current discharge risk:  Cognitively Impaired, Dependent with Mobility, Lives alone Barriers to Discharge:  Continued Medical Work up, English as a second language teachernsurance Authorization, Other(Placement in [name])   Margarito LinerSarah C Graesyn Schreifels, LCSW 07/19/2018, 5:15 PM

## 2018-07-19 NOTE — Progress Notes (Signed)
[facility] ROUNDING NOTE   Subjective:   Interval History:  Awake and alert this morning  Much more interactive than yesterday     Objective:  Vital signs in last 24 hours:  Temp:  [97.8 F (36.6 C)-98.3 F (36.8 C)] 98.3 F (36.8 C) (07/23 0342) Pulse Rate:  [78-88] 84 (07/23 0342) Resp:  [18] 18 (07/23 0342) BP: (148-164)/(92-106) 164/92 (07/23 0414) SpO2:  [96 %] 96 % (07/23 0342) Weight:  [195 lb 5.2 oz (88.6 kg)] 195 lb 5.2 oz (88.6 kg) (07/23 0342)  Weight change: -10.8 oz (-0.305 kg) Filed Weights   07/17/18 0100 07/18/18 0415 07/19/18 0342  Weight: 201 lb 8 oz (91.4 kg) 196 lb (88.9 kg) 195 lb 5.2 oz (88.6 kg)    Intake/Output: I/O last 3 completed shifts: In: 1801.6 [P.O.:360; I.V.:1441.6] Out: 6100 [Urine:6100]   Intake/Output this shift:  No intake/output data recorded.  Alert and cooperative   CVS- RRR   JVP  Not elevated at this time  RS- CTA  Bilaterally  ABD- BS present soft non-distended EXT- no edema   Basic Metabolic Panel: Recent Labs  Lab 07/15/18 0623 07/16/18 0559 07/17/18 0543 07/18/18 0625 07/19/18 0715  NA 142 141 140 145 146*  K 5.5* 5.1 4.9 5.0 4.6  CL 99 103 106 111 114*  CO2 27 23 21* 21* 21*  GLUCOSE 94 96 106* 100* 100*  BUN 86* 92* 88* 83* 69*  CREATININE 14.16* 15.10* 13.83* 11.92* 8.97*  CALCIUM 8.3* 8.1* 8.2* 8.7* 9.0  MG 2.6* 2.4 2.4 2.1 2.2  PHOS 6.4* 7.0* 6.4* 7.1* 6.5*    Liver Function Tests: Recent Labs  Lab 07/14/18 0434 07/15/18 0623 07/16/18 0559 07/17/18 0543 07/18/18 0625 07/19/18 0715  AST 21  --   --  17  --   --   ALT 39  --   --  23  --   --   ALKPHOS 58  --   --  52  --   --   BILITOT 0.4  --   --  0.5  --   --   PROT 6.2*  --   --  5.8*  --   --   ALBUMIN 2.5*  2.4* 2.5* 2.5* 2.4*  2.4* 2.7* 2.6*   No results for input(s): LIPASE, AMYLASE in the last 168 hours. No results for input(s): AMMONIA in the last 168 hours.  CBC: Recent Labs  Lab 07/15/18 0623 07/16/18 0559  07/17/18 0543 07/17/18 1415 07/18/18 0625 07/19/18 0715  WBC 7.6 7.3 7.7  --  6.3 6.4  HGB 8.5* 8.2* 7.5* 8.0* 8.5* 8.6*  HCT 27.8* 26.7* 24.1* 26.0* 28.4* 28.8*  MCV 71.5* 71.8* 71.1*  --  72.1* 72.4*  PLT 480* 479* 476*  --  477* 492*    Cardiac Enzymes: No results for input(s): CKTOTAL, CKMB, CKMBINDEX, TROPONINI in the last 168 hours.  BNP: Invalid input(s): POCBNP  CBG: Recent Labs  Lab 07/18/18 1645 07/18/18 2109 07/19/18 0230 07/19/18 0443 07/19/18 0743  GLUCAP 124* 102* 105* 93 98    Microbiology: Results for orders placed or performed during the hospital encounter of 06/30/18  MRSA PCR Screening     Status: None   Collection Time: 06/30/18  5:42 PM  Result Value Ref Range Status   MRSA by PCR NEGATIVE NEGATIVE Final    Comment:        The GeneXpert MRSA Assay (FDA approved for NASAL specimens only), is one component of a comprehensive MRSA colonization  surveillance program. It is not intended to diagnose MRSA infection nor to guide or monitor treatment for MRSA infections. Performed at [facility], [address] [place], [place] [address]   C difficile quick scan w PCR reflex     Status: None   Collection Time: 06/30/18  8:33 PM  Result Value Ref Range Status   C Diff antigen NEGATIVE NEGATIVE Final   C Diff toxin NEGATIVE NEGATIVE Final   C Diff interpretation No C. difficile detected.  Final  Culture, Urine     Status: None   Collection Time: 07/15/18  4:27 PM  Result Value Ref Range Status   Specimen Description URINE, CLEAN CATCH  Final   Special Requests NONE  Final   Culture   Final    NO GROWTH Performed at [facility], [address] [place], [place] [address]    Report Status 07/16/2018 FINAL  Final    Coagulation Studies: No results for input(s): LABPROT, INR in the last 72 hours.  Urinalysis: No results for input(s): COLORURINE, LABSPEC, PHURINE, GLUCOSEU, HGBUR, BILIRUBINUR, KETONESUR, PROTEINUR, UROBILINOGEN,  NITRITE, LEUKOCYTESUR in the last 72 hours.  Invalid input(s): APPERANCEUR    Imaging: No results found.   Medications:   . sodium chloride 125 mL/hr at 07/18/18 2009   . amLODipine  5 mg Oral Daily  . chlorhexidine  15 mL Mouth Rinse BID  . Chlorhexidine Gluconate Cloth  6 each Topical Q0600  . Chlorhexidine Gluconate Cloth  6 each Topical Q0600  . Chlorhexidine Gluconate Cloth  6 each Topical Q0600  . Chlorhexidine Gluconate Cloth  6 each Topical Q0600  . Chlorhexidine Gluconate Cloth  6 each Topical Q0600  . darbepoetin (ARANESP) injection - NON-DIALYSIS  100 mcg Subcutaneous Q Mon-1800  . feeding supplement (NEPRO CARB STEADY)  237 mL Oral BID BM  . ferrous sulfate  325 mg Oral Q breakfast  . heparin injection (subcutaneous)  5,000 Units Subcutaneous Q8H  . pantoprazole  40 mg Oral Q1200  . tamsulosin  0.4 mg Oral Daily   alteplase, heparin, heparin, heparin, heparin, heparin, heparin, heparin, heparin, hydrALAZINE, lidocaine (PF), lidocaine-prilocaine, lip balm, pentafluoroprop-tetrafluoroeth  Assessment/ Plan:   Non oliguric acute kidney injury  Secondary to cardiac arrest   S/p CRRT and has had HD prn   Last dialysis session 7/17  Appears to have recovery of renal function with a decrease in serum creatinine.Baseline creatinine 1.23  ( 5/17)    Continue to hold on dialysis at this point - would recommend removal of dialysis catheter   Volume status  Now no edema and appears to have been weaned to room air  - no issues with volume overload appears to be stable   Cardiac arrest  - myoview study showed septal and inferior wall inducible ischemia - history of illicit drug use and no plans to pursue cath at this time  Seen by Dr Cristal Deerhristopher and will follow up after discharge   EF improved to 55%   Anoxic brain injury   Will need rehabilitation  Appears stable although waxes and wanes   BPH  Seen by urology  No obstruction  Continue on flomax   Anemia  Hemoglobin fairly  stable   8.6      T sats are low at 12 % will administer IV iron     also started Darbepoietin  7/22  100 mcg weekly  First dose  ( no transfusions )  Hypernatremia  May have some water deficit  will start some D5 1/2 NS  75 cc/hr    LOS: 19 Carnesha Maravilla W @TODAY @10 :47 AM

## 2018-07-19 NOTE — Progress Notes (Signed)
  Speech Language Pathology Treatment: Dysphagia;Cognitive-Linquistic  Patient Details Name: Christopher Dominguez MRN: [id number] DOB: Apr 08, 1961 Today's Date: 07/19/2018 Time: 1583-0940 SLP Time Calculation (min) (ACUTE ONLY): 18 min  Assessment / Plan / Recommendation Clinical Impression  Dysphagia goals met as patient able to consume regular texture solids and thin liquid without overt s/s of aspiration. Mastication time continues to be delayed however likely due to poor attention span vs a true oral dysphagia. Overall patient appears to be tolerating diet and sister educated on support patient will require to attend to task for safety. Today, patient required moderate-max verbal and tactile cues to sustain attention to self feeding task. Oriented to person and place but not time or situation. Poor self awareness. No further dysphagia needs noted however will benefit from continued f/u for cognition.    HPI HPI: 57 y/o male had a cardiac arrest out of hospital and received 7 rounds of epinephrine to regain a pulse. Tox positive for cocaine. Intubated from 7/4-7/10. Puree diet initated with upgrade to dysphagia 2 on 7/15      SLP Plan  Continue with current plan of care(dysphagia goals discharged)       Recommendations  Diet recommendations: Regular;Thin liquid Liquids provided via: Cup;Straw Medication Administration: Whole meds with puree Supervision: Staff to assist with self feeding;Full supervision/cueing for compensatory strategies Compensations: Slow rate;Small sips/bites Postural Changes and/or Swallow Maneuvers: Seated upright 90 degrees                Oral Care Recommendations: Oral care BID Follow up Recommendations: Inpatient Rehab SLP Visit Diagnosis: Dysphagia, oral phase (R13.11) Plan: Continue with current plan of care(dysphagia goals discharged)       Gabriel Rainwater MA, [place] 07/19/2018, 12:56 PM

## 2018-07-19 NOTE — Plan of Care (Signed)
Danetta Prom N RN 07-19-2018  

## 2018-07-20 LAB — MAGNESIUM: MAGNESIUM: 2.1 mg/dL (ref 1.7–2.4)

## 2018-07-20 LAB — CBC
HCT: 29.3 % — ABNORMAL LOW (ref 39.0–52.0)
Hemoglobin: 8.8 g/dL — ABNORMAL LOW (ref 13.0–17.0)
MCH: 21.8 pg — ABNORMAL LOW (ref 26.0–34.0)
MCHC: 30 g/dL (ref 30.0–36.0)
MCV: 72.5 fL — ABNORMAL LOW (ref 78.0–100.0)
PLATELETS: 490 10*3/uL — AB (ref 150–400)
RBC: 4.04 MIL/uL — ABNORMAL LOW (ref 4.22–5.81)
RDW: 15.9 % — ABNORMAL HIGH (ref 11.5–15.5)
WBC: 6.4 10*3/uL (ref 4.0–10.5)

## 2018-07-20 LAB — RENAL FUNCTION PANEL
Albumin: 2.6 g/dL — ABNORMAL LOW (ref 3.5–5.0)
Anion gap: 9 (ref 5–15)
BUN: 58 mg/dL — ABNORMAL HIGH (ref 6–20)
CALCIUM: 9.2 mg/dL (ref 8.9–10.3)
CO2: 21 mmol/L — ABNORMAL LOW (ref 22–32)
Chloride: 117 mmol/L — ABNORMAL HIGH (ref 98–111)
Creatinine, Ser: 6.79 mg/dL — ABNORMAL HIGH (ref 0.61–1.24)
GFR, EST AFRICAN AMERICAN: 9 mL/min — AB (ref >60)
GFR, EST NON AFRICAN AMERICAN: 8 mL/min — AB (ref >60)
Glucose, Bld: 117 mg/dL — ABNORMAL HIGH (ref 70–99)
Phosphorus: 5.1 mg/dL — ABNORMAL HIGH (ref 2.5–4.6)
Potassium: 4.5 mmol/L (ref 3.5–5.1)
SODIUM: 147 mmol/L — AB (ref 135–145)

## 2018-07-20 LAB — GLUCOSE, CAPILLARY
GLUCOSE-CAPILLARY: 106 mg/dL — AB (ref 70–99)
GLUCOSE-CAPILLARY: 110 mg/dL — AB (ref 70–99)
GLUCOSE-CAPILLARY: 114 mg/dL — AB (ref 70–99)
Glucose-Capillary: 107 mg/dL — ABNORMAL HIGH (ref 70–99)
Glucose-Capillary: 107 mg/dL — ABNORMAL HIGH (ref 70–99)
Glucose-Capillary: 115 mg/dL — ABNORMAL HIGH (ref 70–99)
Glucose-Capillary: 123 mg/dL — ABNORMAL HIGH (ref 70–99)

## 2018-07-20 MED ORDER — DEXTROSE-NACL 5-0.2 % IV SOLN
INTRAVENOUS | Status: DC
  Administered 2018-07-20 – 2018-07-21 (×2): via INTRAVENOUS

## 2018-07-20 NOTE — Progress Notes (Signed)
  Speech Language Pathology Treatment: Cognitive-Linquistic  Patient Details Name: Christopher Dominguez MRN: [id number] DOB: [birth year] Today's Date: 07/20/2018 Time: [id number] SLP Time Calculation (min) (ACUTE ONLY): 25 min  Assessment / Plan / Recommendation Clinical Impression  Pt with continued cognitive deficits with attention/orientation and simple problem solving focus of therapy session this date: pt oriented to self,place, but not time and/or situation; vague answers with confusion noted throughout session with simple functional tasks; pt required min-mod verbal cues for orientation to date with environmental cues given; repetition and min cues required by end of session for use of wall calendar; simple sustained attention tasks (calculation, functional commands) required mod verbal cues with 40% accuracy achieved overall; educated sister re: cognitive deficits and environmental cues/orientation tasks to assist daily; ST will continue to f/u while in acute setting; recommend CIR at D/C.  HPI HPI: 57 y/o male had a cardiac arrest out of hospital and received 7 rounds of epinephrine to regain a pulse. Tox positive for cocaine. Intubated from 7/4-7/10. Puree diet initated with upgrade to dysphagia 2 on 7/15; dysphagia goals d/c; pt tolerating regular/thin liquid diet at present.      SLP Plan  Continue with current plan of care       Recommendations   Inpatient Rehab (possibly [place] per sister); ST f/u for cognitive deficits                General recommendations: Other(comment)(TBD; inpatient rehab in [place]?) Follow up Recommendations: Inpatient Rehab Plan: Continue with current plan of care                      Tressie StalkerPat Adams, M.S., [facility] -SLP 07/20/2018, 12:32 PM

## 2018-07-20 NOTE — [place] FL2 (Signed)
[place] MEDICAID FL2 LEVEL OF CARE SCREENING TOOL     IDENTIFICATION  Patient Name: Christopher Dominguez Birthdate: 1961/01/27 Sex: male Admission Date (Current Location): 06/30/2018  [facility] [place] and [place] Number:  Producer, television/film/video and Address:  [facility], [address], [place], [place] [address]      Provider Number: [id number]  Attending Physician Name and Address:  Barnetta Chapel, MD  Relative Name and Phone Number:       Current Level of Care: Hospital Recommended Level of Care: Skilled Nursing Facility Prior Approval Number:    Date Approved/Denied:   PASRR Number:    Discharge Plan: SNF    Current Diagnoses: Patient Active Problem List   Diagnosis Date Noted  . Gross hematuria   . Lower GI bleed   . AKI (acute kidney injury) (HCC)   . 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 Encounter for orogastric (OG) tube placement   . Acute respiratory failure with hypoxemia (HCC)   . Anoxic brain injury (HCC)   . Cardiac arrest (HCC) 06/30/2018    Orientation RESPIRATION BLADDER Height & Weight     (Fluctuates. Knew name and birthday this morning but other times disoriented x 4.)  Normal Incontinent, External catheter Weight: 194 lb 7.1 oz (88.2 kg) Height:  5\' 11"  (180.3 cm)  BEHAVIORAL SYMPTOMS/MOOD NEUROLOGICAL BOWEL NUTRITION STATUS  Other (Comment)(Impulsive, pulls IV.) (None) Incontinent Diet(Regular)  AMBULATORY STATUS COMMUNICATION OF NEEDS Skin   Limited Assist Verbally Skin abrasions, Bruising                       Personal Care Assistance Level of Assistance  Bathing, Feeding, Dressing Bathing Assistance: Limited assistance Feeding assistance: Limited assistance Dressing Assistance: Limited assistance     Functional Limitations Info  Sight, Hearing, Speech Sight Info: Adequate Hearing Info: Adequate Speech Info: Adequate    SPECIAL CARE FACTORS FREQUENCY  PT (By licensed PT), Restraints, OT (By licensed  OT)     PT Frequency: 5 x week OT Frequency: 5 x week       Restraints Frequency: Right wrist restraint and mitten on left hand. RN stated patient is not combative, just impulsive, pulling lines, etc due to confusion. These will be discontinued at least 24 hours prior to discharge.    Contractures      Additional Factors Info  Code Status, Allergies Code Status Info: Full Allergies Info: NKDA           Current Medications (07/20/2018):  This is the current hospital active medication list Current Facility-Administered Medications  Medication Dose Route Frequency Provider Last Rate Last Dose  . alteplase (CATHFLO ACTIVASE) injection 2 mg  2 mg Intracatheter Once PRN Lauris Poag, MD      . amLODipine (NORVASC) tablet 10 mg  10 mg Oral Daily Zigmund Daniel., MD   10 mg at 07/20/18 1003  . chlorhexidine (PERIDEX) 0.12 % solution 15 mL  15 mL Mouth Rinse BID Max Fickle B, MD   15 mL at 07/19/18 2119  . Chlorhexidine Gluconate Cloth 2 % PADS 6 each  6 each Topical Q0600 Lupita Leash, MD   6 each at 07/19/18 [phone]  . Chlorhexidine Gluconate Cloth 2 % PADS 6 each  6 each Topical Q0600 Lauris Poag, MD   6 each at 07/19/18 0645  . Chlorhexidine Gluconate Cloth 2 % PADS 6 each  6 each Topical Q0600 Lauris Poag, MD  6 each at 07/19/18 0645  . Chlorhexidine Gluconate Cloth 2 % PADS 6 each  6 each Topical Q0600 Lauris PoagPowell, Alvin C, MD   6 each at 07/19/18 0645  . Chlorhexidine Gluconate Cloth 2 % PADS 6 each  6 each Topical Q0600 Lauris PoagPowell, Alvin C, MD   6 each at 07/19/18 0645  . Darbepoetin Alfa (ARANESP) injection 100 mcg  100 mcg Subcutaneous Q Mon-1800 Elvis CoilWebb, Martin, MD   100 mcg at 07/18/18 1722  . dextrose 5 %-0.45 % sodium chloride infusion   Intravenous Continuous Zigmund DanielPowell, A Caldwell Jr., MD 150 mL/hr at 07/20/18 0434    . feeding supplement (NEPRO CARB STEADY) liquid 237 mL  237 mL Oral BID BM Max FickleMcQuaid, Douglas B, MD   237 mL at 07/19/18 0909  . ferrous sulfate  tablet 325 mg  325 mg Oral Q breakfast Zigmund DanielPowell, A Caldwell Jr., MD   325 mg at 07/20/18 1003  . [START ON 07/21/2018] ferumoxytol (FERAHEME) 510 mg in sodium chloride 0.9 % 100 mL IVPB  510 mg Intravenous Once Elvis CoilWebb, Martin, MD      . heparin injection 1,000 Units  1,000 Units Dialysis PRN Lauris PoagPowell, Alvin C, MD   1,000 Units at 07/13/18 0435  . heparin injection 1,000 Units  1,000 Units Dialysis PRN Lauris PoagPowell, Alvin C, MD      . heparin injection 1,000 Units  1,000 Units Dialysis PRN Lauris PoagPowell, Alvin C, MD      . heparin injection 1,000 Units  1,000 Units Dialysis PRN Lauris PoagPowell, Alvin C, MD      . heparin injection 1,800 Units  20 Units/kg Dialysis PRN Lauris PoagPowell, Alvin C, MD   1,800 Units at 07/13/18 0055  . heparin injection 1,800 Units  20 Units/kg Dialysis PRN Lauris PoagPowell, Alvin C, MD      . heparin injection 1,800 Units  20 Units/kg Dialysis PRN Lauris PoagPowell, Alvin C, MD      . heparin injection 1,800 Units  20 Units/kg Dialysis PRN Lauris PoagPowell, Alvin C, MD      . heparin injection 5,000 Units  5,000 Units Subcutaneous Q8H Lauris PoagPowell, Alvin C, MD   5,000 Units at 07/20/18 [id number]  . hydrALAZINE (APRESOLINE) injection 10 mg  10 mg Intravenous Q4H PRN Karl ItoSommer, Steven E, MD   10 mg at 07/06/18 1421  . lidocaine (PF) (XYLOCAINE) 1 % injection 5 mL  5 mL Intradermal PRN Lauris PoagPowell, Alvin C, MD      . lidocaine-prilocaine (EMLA) cream 1 application  1 application Topical PRN Lauris PoagPowell, Alvin C, MD      . lip balm (CARMEX) ointment   Topical PRN Reyes IvanAljishi, Wael Z, MD   1 application at 07/10/18 2253  . pantoprazole (PROTONIX) EC tablet 40 mg  40 mg Oral Q1200 Lupita LeashMcQuaid, Douglas B, MD   40 mg at 07/19/18 1219  . pentafluoroprop-tetrafluoroeth (GEBAUERS) aerosol 1 application  1 application Topical PRN Lauris PoagPowell, Alvin C, MD      . tamsulosin [facility](FLOMAX) capsule 0.4 mg  0.4 mg Oral Daily Ollis, Brandi L, NP   0.4 mg at 07/20/18 1003     Discharge Medications: Please see discharge summary for a list of discharge medications.  Relevant Imaging  Results:  Relevant Lab Results:   Additional Information  SS#: [id number]  Margarito LinerSarah C Vickey Boak, LCSW

## 2018-07-20 NOTE — Progress Notes (Signed)
[facility] ROUNDING NOTE   Subjective:    Wife at bedside  No complaints     Urine output 5000  cc   7/23  Tremendous diuresis  Objective:  Vital signs in last 24 hours:  Temp:  [97.9 F (36.6 C)-99 F (37.2 C)] 98 F (36.7 C) (07/24 0410) Pulse Rate:  [75-97] 75 (07/24 0439) Resp:  [18] 18 (07/24 0410) BP: (152-159)/(100-106) 154/100 (07/24 0439) SpO2:  [96 %-98 %] 98 % (07/24 0410) Weight:  [194 lb 7.1 oz (88.2 kg)] 194 lb 7.1 oz (88.2 kg) (07/24 0410)  Weight change: -14.1 oz (-0.4 kg) Filed Weights   07/18/18 0415 07/19/18 0342 07/20/18 0410  Weight: 196 lb (88.9 kg) 195 lb 5.2 oz (88.6 kg) 194 lb 7.1 oz (88.2 kg)    Intake/Output: I/O last 3 completed shifts: In: 2539.9 [P.O.:480; I.V.:2059.9] Out: [address] [Urine:10250]   Intake/Output this shift:  No intake/output data recorded.  Alert and cooperative   CVS- RRR   JVP  Not elevated at this time  RS- CTA  Bilaterally  ABD- BS present soft non-distended EXT- no edema   Basic Metabolic Panel: Recent Labs  Lab 07/16/18 0559 07/17/18 0543 07/18/18 0625 07/19/18 0715 07/20/18 0434  NA 141 140 145 146* 147*  K 5.1 4.9 5.0 4.6 4.5  CL 103 106 111 114* 117*  CO2 23 21* 21* 21* 21*  GLUCOSE 96 106* 100* 100* 117*  BUN 92* 88* 83* 69* 58*  CREATININE 15.10* 13.83* 11.92* 8.97* 6.79*  CALCIUM 8.1* 8.2* 8.7* 9.0 9.2  MG 2.4 2.4 2.1 2.2 2.1  PHOS 7.0* 6.4* 7.1* 6.5* 5.1*    Liver Function Tests: Recent Labs  Lab 07/14/18 0434  07/16/18 0559 07/17/18 0543 07/18/18 0625 07/19/18 0715 07/20/18 0434  AST 21  --   --  17  --   --   --   ALT 39  --   --  23  --   --   --   ALKPHOS 58  --   --  52  --   --   --   BILITOT 0.4  --   --  0.5  --   --   --   PROT 6.2*  --   --  5.8*  --   --   --   ALBUMIN 2.5*  2.4*   < > 2.5* 2.4*  2.4* 2.7* 2.6* 2.6*   < > = values in this interval not displayed.   No results for input(s): LIPASE, AMYLASE in the last 168 hours. No results for input(s):  AMMONIA in the last 168 hours.  CBC: Recent Labs  Lab 07/16/18 0559 07/17/18 0543 07/17/18 1415 07/18/18 0625 07/19/18 0715 07/20/18 0434  WBC 7.3 7.7  --  6.3 6.4 6.4  HGB 8.2* 7.5* 8.0* 8.5* 8.6* 8.8*  HCT 26.7* 24.1* 26.0* 28.4* 28.8* 29.3*  MCV 71.8* 71.1*  --  72.1* 72.4* 72.5*  PLT 479* 476*  --  477* 492* 490*    Cardiac Enzymes: No results for input(s): CKTOTAL, CKMB, CKMBINDEX, TROPONINI in the last 168 hours.  BNP: Invalid input(s): POCBNP  CBG: Recent Labs  Lab 07/19/18 1609 07/19/18 2113 07/20/18 0025 07/20/18 0406 07/20/18 0408  GLUCAP 111* 130* 115* 110* 106*    Microbiology: Results for orders placed or performed during the hospital encounter of 06/30/18  MRSA PCR Screening     Status: None   Collection Time: 06/30/18  5:42 PM  Result Value Ref Range Status  MRSA by PCR NEGATIVE NEGATIVE Final    Comment:        The GeneXpert MRSA Assay (FDA approved for NASAL specimens only), is one component of a comprehensive MRSA colonization surveillance program. It is not intended to diagnose MRSA infection nor to guide or monitor treatment for MRSA infections. Performed at [facility], [address] [place], [place] [address]   C difficile quick scan w PCR reflex     Status: None   Collection Time: 06/30/18  8:33 PM  Result Value Ref Range Status   C Diff antigen NEGATIVE NEGATIVE Final   C Diff toxin NEGATIVE NEGATIVE Final   C Diff interpretation No C. difficile detected.  Final  Culture, Urine     Status: None   Collection Time: 07/15/18  4:27 PM  Result Value Ref Range Status   Specimen Description URINE, CLEAN CATCH  Final   Special Requests NONE  Final   Culture   Final    NO GROWTH Performed at [facility], [address] [place], [place] [address]    Report Status 07/16/2018 FINAL  Final    Coagulation Studies: No results for input(s): LABPROT, INR in the last 72 hours.  Urinalysis: No results for input(s):  COLORURINE, LABSPEC, PHURINE, GLUCOSEU, HGBUR, BILIRUBINUR, KETONESUR, PROTEINUR, UROBILINOGEN, NITRITE, LEUKOCYTESUR in the last 72 hours.  Invalid input(s): APPERANCEUR    Imaging: No results found.   Medications:   . dextrose 5 % and 0.45% NaCl 150 mL/hr at 07/20/18 0434  . [START ON 07/21/2018] ferumoxytol     . amLODipine  10 mg Oral Daily  . chlorhexidine  15 mL Mouth Rinse BID  . Chlorhexidine Gluconate Cloth  6 each Topical Q0600  . Chlorhexidine Gluconate Cloth  6 each Topical Q0600  . Chlorhexidine Gluconate Cloth  6 each Topical Q0600  . Chlorhexidine Gluconate Cloth  6 each Topical Q0600  . Chlorhexidine Gluconate Cloth  6 each Topical Q0600  . darbepoetin (ARANESP) injection - NON-DIALYSIS  100 mcg Subcutaneous Q Mon-1800  . feeding supplement (NEPRO CARB STEADY)  237 mL Oral BID BM  . ferrous sulfate  325 mg Oral Q breakfast  . heparin injection (subcutaneous)  5,000 Units Subcutaneous Q8H  . pantoprazole  40 mg Oral Q1200  . tamsulosin  0.4 mg Oral Daily   alteplase, heparin, heparin, heparin, heparin, heparin, heparin, heparin, heparin, hydrALAZINE, lidocaine (PF), lidocaine-prilocaine, lip balm, pentafluoroprop-tetrafluoroeth  Assessment/ Plan:   Non oliguric acute kidney injury  Secondary to cardiac arrest   S/p CRRT and has had HD prn   Last dialysis session 7/17  Appears to have recovery of renal function with a decrease in serum creatinine.Baseline creatinine 1.23  ( 5/17)    Continue to hold on dialysis at this point - dialysis catheter removed  Volume status  Now no edema and appears to have been weaned to room air  - no issues with volume overload appears to be stable   Cardiac arrest  - myoview study showed septal and inferior wall inducible ischemia - history of illicit drug use and no plans to pursue cath at this time  Seen by Dr Cristal Deerhristopher and will follow up after discharge   EF improved to 55%   Anoxic brain injury   Will need rehabilitation  Appears  stable although waxes and wanes   BPH  Seen by urology  No obstruction  Continue on flomax   Anemia  Hemoglobin fairly stable   8.8  started  Darbepoietin ( no transfusions )   Hypernatremia  Would start some D5 1/4 NS at 75 cc/hr      It looks like we could sign off at this point  I anticipate recovery  He could follow up with [facility] on discharge   I think a [place] clinic appointment would be reasonable    LOS: 20 Kerina Simoneau W @TODAY @11 :06 AM

## 2018-07-20 NOTE — Progress Notes (Signed)
PT Cancellation Note  Patient Details Name: Christopher Dominguez MRN: [id number] DOB: Sep 15, 1961   Cancelled Treatment:    Reason Eval/Treat Not Completed: Medical issues which prohibited therapy.  Pt has elevated diastolic BP and will try again at another time.   Ivar DrapeRuth E Angelys Yetman 07/20/2018, 11:24 AM   Samul Dadauth [name], PT MS Acute Rehab Dept. Number: [facility] [phone] and [facility] [phone]

## 2018-07-20 NOTE — Progress Notes (Signed)
Paged attending MD for a renew order for restraint.

## 2018-07-20 NOTE — Plan of Care (Signed)
  Problem: Activity: Goal: Risk for activity intolerance will decrease Note:  Patient up to the bedside commode to and tolerated well   Problem: Nutrition: Goal: Adequate nutrition will be maintained Note:  Patient ate 50 % of 2 meals with ensure inbetween   Problem: Coping: Goal: Level of anxiety will decrease Note:  Restless in bed at times   Problem: Elimination: Goal: Will not experience complications related to bowel motility Note:  No bm today Goal: Will not experience complications related to urinary retention Note:  N/a   Problem: Elimination: Goal: Will not experience complications related to urinary retention Note:  N/a   Problem: Safety: Goal: Ability to remain free from injury will improve Note:  Mittens and Right wrist restraint   Problem: Neurologic: Goal: Promote progressive neurologic recovery Note:  Frequent reorientation   Problem: Skin Integrity: Goal: Risk for impaired skin integrity will be minimized. Note:  Condom cath placed for incontinence

## 2018-07-20 NOTE — Clinical Social Work Note (Addendum)
CSW left voicemail for admissions coordinator at [facility] in [place], [place].  Charlynn CourtSarah Naomy Esham, CSW [phone]  1:12 pm Tried calling SNF again. Did not leave a second voicemail.  Charlynn CourtSarah Aalina Brege, CSW [phone]

## 2018-07-20 NOTE — Progress Notes (Signed)
PROGRESS NOTE    Christopher KaufmannShelton M Dominguez  ZOX:[id number] DOB: 1961/10/19 DOA: 06/30/2018 PCP: Patient, No Pcp Per   Brief Narrative:  57 yo with no significant past medical history, originally from [place] admitted to critical care on 7/4 following a cardiac arrest. -Hospitalization complicated by acute kidney injury requiring hemodialysis, chorea post extubation, encephalopathy from anoxic brain injury, cardiomyopathy with EF down to 25% which recovered to 55% range in 3 days on repeat echo. -Transferred from PCCM to hospitalist service 7/16  He's now s/p stress test which was positive by radiology read, but cardiology disagreed with rad interpretation and recommending outpatient follow up.  His kidney function is improving and his temporary dialysis catheter has been removed.  He continues to have waxing/waning confusion.  Will likely be appropriate for SNF placement within a few days.    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  CXR without active cardiopulm dz.  Follow urinalysis and culture (no growth).  Will obtain blood cx if recurrent temp.  Hold off on abx for now.  Afebrile since, follow. 07/20/2018: No further fever documented.  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  Recommending proceeding with rehab and f/u outpatient  with Dr. Cristal Deerhristopher. -Possible discharge to rehab facility versus skilled nursing facility in the next few days.    Acute respiratory failure with hypoxemia (HCC) - following cardiac arrest - Extubated 7/10 - Also treated for aspiration pneumonia, completed antibiotics - Continue pulmonary toilet, incentive spirometer, nebs as needed - Currently doing well on room air   Encephalopathy  anoxic brain injury  Post Hypoxic Chroea -seen by neurology, noted to have waxing and waning mental status (this has continued as I've seen him as well) - delirium precautions -Haldol when necessary (recommended not continuing this long term), gabapentin could be tried as an alternative if movements are interfering with rehab and less sedating alternative needed (gabapentin 300 mg BID -Will need long-term rehabilitation and outpatient neurology follow-up (if persistent deficits and abnormal movements after rehab) -SLP following on dysphagia 2 diet - Therapy recommending CIR, but sounds like family may opt for SNF  Acute renal failure -Due to cardiac arrest, shock -Renal following, was getting CRRT - intermittent HD now, has right temporary IJ HD catheter - Holding dialysis with fall in creatinine and improved UOP.  IVF per nephrology (pt developing hypernatremia, IVF increased to 150 - previously on 1/2 NS 125 cc/hr). - 3.4 L UOP yesterday - Dialysis catheter removed 7/23 -Continue to monitor.  Nausea  Vomiting: occurred x1 today and a couple of days ago as well.  Having BM, no tenderness on exam.  Will ctm for now.  Will repeat EKG for QT eval (prolonged initially - now improved) in case he needs antiemetics. 07/20/2018: None reported  Hematuria/enlarged prostate -Seen by urology -Given very  large prostate recommended to avoid Foley unless residuals greater than 500 -continue Flomax   Transient rectal bleeding -Resolved, seen by GI, supportive care for now -Subcutaneous heparin held -> now  resumed, continue SCDs for DVT prophylaxis   shock liver -Transaminitis improved -Trend LFTs  Anemia: stable.  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  Feraheme given 7/22 and 2nd dose ordered by renal for 7/25.  Renal giving epo.  Hypertension: increase amlodipine to 10 mg   DVT prophylaxis: heparin Code Status: full  Family Communication: sister at bedside Disposition Plan: likely to d/c to SNF soon with continued improvement in renal function    Consultants:   Renal  Cards  GI  Urology  Procedures:   R IJ HD catheter CRRT followed by IHD  07/04/18 Study Conclusions  - Left ventricle: The cavity size was normal. Wall thickness was   normal. Systolic function was normal. The estimated ejection   fraction was in the range of 55% to 60%. Wall motion was normal;   there were no regional wall motion abnormalities. - Aortic root: The aortic root was mildly dilated. - Right ventricle: The cavity size was mildly dilated.  Impressions:  - Normal LV function; mildly dilated aortic root; mild RVE.  07/01/18 Study Conclusions  - Left ventricle: The cavity size was normal. Wall thickness was   increased in a pattern of mild LVH. Systolic function was   severely reduced. The estimated ejection fraction was in the   range of 25% to 30%. Diffuse hypokinesis. Doppler parameters are   consistent with abnormal left ventricular relaxation (grade 1   diastolic dysfunction). - Aortic root: The aortic root was mildly dilated. - Mitral valve: Calcified annulus. - Right ventricle: The cavity size was mildly dilated. Systolic   function was severely reduced. - Right atrium: The atrium was mildly dilated.  Impressions:  - Technically difficult; definity used; severe LV dysfunction; mild   LVH; mild diastolic dysfunction; enlarged right side with   moderate to severe RV dysfunction.  7/5 Final Interpretation: Right: There is no evidence of deep vein  thrombosis in the lower extremity. No cystic structure found in the popliteal fossa. Left: There is no evidence of deep vein thrombosis in the lower extremity. However, portions of this examination were limited- see technologist comments above. No cystic structure found in the popliteal fossa.  Antimicrobials:  Anti-infectives (From admission, onward)   Start     Dose/Rate Route Frequency Ordered Stop   07/06/18 1200  piperacillin-tazobactam (ZOSYN) IVPB 3.375 g    Note to Pharmacy:  Please stop after today   3.375 g 100 mL/hr over 30 Minutes Intravenous Every 6 hours 07/06/18 1015 07/06/18 1932   07/02/18 1800  piperacillin-tazobactam (ZOSYN) IVPB 3.375 g  Status:  Discontinued     3.375 g 12.5 mL/hr over 240 Minutes Intravenous Every 6 hours 07/02/18 1309 07/02/18 1310   07/02/18 1800  piperacillin-tazobactam (ZOSYN) IVPB 3.375 g  Status:  Discontinued     3.375 g 100 mL/hr over 30 Minutes Intravenous Every 6 hours 07/02/18 1311 07/06/18 1015   07/02/18 1200  piperacillin-tazobactam (ZOSYN) IVPB 2.25 g  Status:  Discontinued     2.25 g 100 mL/hr over 30 Minutes Intravenous Every 6 hours 07/02/18 0728 07/02/18 1309   07/01/18 0100  piperacillin-tazobactam (ZOSYN) IVPB 3.375 g  Status:  Discontinued     3.375 g 12.5 mL/hr over 240 Minutes Intravenous Every 8 hours 06/30/18 1812 07/02/18 0728   06/30/18 1815  piperacillin-tazobactam (ZOSYN) IVPB 3.375 g     3.375 g 100 mL/hr over 30 Minutes Intravenous  Once 06/30/18 1804 06/30/18 1945      Subjective: No significant history from the patient.   Seen alongside patient's sister.   Patient sister is planning to take patient back to [place].   Possible placement was [place] axis.  Objective: Vitals:   07/19/18 2036 07/20/18 0410 07/20/18 0439 07/20/18 1151  BP:  (!) 157/102 (!) 154/100 (!) 160/105  Pulse:  96 75 91  Resp: 18 18  20   Temp: 97.9 F (36.6 C) 98 F (36.7 C)  98 F (36.7 C)  TempSrc: Oral Oral  Oral  SpO2:  98% 98%  97%  Weight:  88.2 kg (194 lb 7.1 oz)    Height:        Intake/Output Summary (Last 24 hours) at 07/20/2018 1735 Last data filed at 07/20/2018 1244 Gross per 24 hour  Intake 2613.88 ml  Output 4700 ml  Net -2086.12 ml   Filed Weights   07/18/18 0415 07/19/18 0342 07/20/18 0410  Weight: 88.9 kg (196 lb) 88.6 kg (195 lb 5.2 oz) 88.2 kg (194 lb 7.1 oz)    Examination:  General: No acute distress. Cardiovascular: S1-S2 Lungs: Clear to auscultation bilaterally with good air movement.  Abdomen: Soft, nontender, nondistended Neurological: Awake and alert.  Patient moves all limbs.  Encephalopathy persists.   Extremities: No leg edema.    Data Reviewed: I have personally reviewed following labs and imaging studies  CBC: Recent Labs  Lab 07/16/18 0559 07/17/18 0543 07/17/18 1415 07/18/18 0625 07/19/18 0715 07/20/18 0434  WBC 7.3 7.7  --  6.3 6.4 6.4  HGB 8.2* 7.5* 8.0* 8.5* 8.6* 8.8*  HCT 26.7* 24.1* 26.0* 28.4* 28.8* 29.3*  MCV 71.8* 71.1*  --  72.1* 72.4* 72.5*  PLT 479* 476*  --  477* 492* 490*   Basic Metabolic Panel: Recent Labs  Lab 07/16/18 0559 07/17/18 0543 07/18/18 0625 07/19/18 0715 07/20/18 0434  NA 141 140 145 146* 147*  K 5.1 4.9 5.0 4.6 4.5  CL 103 106 111 114* 117*  CO2 23 21* 21* 21* 21*  GLUCOSE 96 106* 100* 100* 117*  BUN 92* 88* 83* 69* 58*  CREATININE 15.10* 13.83* 11.92* 8.97* 6.79*  CALCIUM 8.1* 8.2* 8.7* 9.0 9.2  MG 2.4 2.4 2.1 2.2 2.1  PHOS 7.0* 6.4* 7.1* 6.5* 5.1*   GFR: Estimated Creatinine Clearance: 12.6 mL/min (A) (by C-G formula based on SCr of 6.79 mg/dL (H)). Liver Function Tests: Recent Labs  Lab 07/14/18 0434  07/16/18 0559 07/17/18 0543 07/18/18 0625 07/19/18 0715 07/20/18 0434  AST 21  --   --  17  --   --   --   ALT 39  --   --  23  --   --   --   ALKPHOS 58  --   --  52  --   --   --   BILITOT 0.4  --   --  0.5  --   --   --   PROT 6.2*  --   --  5.8*  --   --   --   ALBUMIN 2.5*  2.4*   < > 2.5* 2.4*   2.4* 2.7* 2.6* 2.6*   < > = values in this interval not displayed.   No results for input(s): LIPASE, AMYLASE in the last 168 hours. No results for input(s): AMMONIA in the last 168 hours. Coagulation  Profile: No results for input(s): INR, PROTIME in the last 168 hours. Cardiac Enzymes: No results for input(s): CKTOTAL, CKMB, CKMBINDEX, TROPONINI in the last 168 hours. BNP (last 3 results) No results for input(s): PROBNP in the last 8760 hours. HbA1C: No results for input(s): HGBA1C in the last 72 hours. CBG: Recent Labs  Lab 07/20/18 0025 07/20/18 0406 07/20/18 0408 07/20/18 1148 07/20/18 1540  GLUCAP 115* 110* 106* 123* 107*   Lipid Profile: No results for input(s): CHOL, HDL, LDLCALC, TRIG, CHOLHDL, LDLDIRECT in the last 72 hours. Thyroid Function Tests: No results for input(s): TSH, T4TOTAL, FREET4, T3FREE, THYROIDAB in the last 72 hours. Anemia Panel: Recent Labs    07/18/18 1028  TIBC 302  IRON 36*   Sepsis Labs: No results for input(s): PROCALCITON, LATICACIDVEN in the last 168 hours.  Recent Results (from the past 240 hour(s))  Culture, Urine     Status: None   Collection Time: 07/15/18  4:27 PM  Result Value Ref Range Status   Specimen Description URINE, CLEAN CATCH  Final   Special Requests NONE  Final   Culture   Final    NO GROWTH Performed at [facility], [address] [place], [place] [address]    Report Status 07/16/2018 FINAL  Final         Radiology Studies: No results found.      Scheduled Meds: . amLODipine  10 mg Oral Daily  . chlorhexidine  15 mL Mouth Rinse BID  . Chlorhexidine Gluconate Cloth  6 each Topical Q0600  . Chlorhexidine Gluconate Cloth  6 each Topical Q0600  . Chlorhexidine Gluconate Cloth  6 each Topical Q0600  . Chlorhexidine Gluconate Cloth  6 each Topical Q0600  . Chlorhexidine Gluconate Cloth  6 each Topical Q0600  . darbepoetin (ARANESP) injection - NON-DIALYSIS  100 mcg Subcutaneous Q Mon-1800  .  feeding supplement (NEPRO CARB STEADY)  237 mL Oral BID BM  . ferrous sulfate  325 mg Oral Q breakfast  . heparin injection (subcutaneous)  5,000 Units Subcutaneous Q8H  . pantoprazole  40 mg Oral Q1200  . tamsulosin  0.4 mg Oral Daily   Continuous Infusions: . dextrose 5 % and 0.2 % NaCl 75 mL/hr at 07/20/18 1306  . [START ON 07/21/2018] ferumoxytol       LOS: 20 days    Time spent: over 30 min    Barnetta Chapel, MD [facility] Pager [phone].  If 7PM-7AM, please contact night-coverage [email or web address] Password [facility] 07/20/2018, 5:35 PM

## 2018-07-21 LAB — RENAL FUNCTION PANEL
ANION GAP: 11 (ref 5–15)
Albumin: 2.8 g/dL — ABNORMAL LOW (ref 3.5–5.0)
BUN: 46 mg/dL — AB (ref 6–20)
CHLORIDE: 111 mmol/L (ref 98–111)
CO2: 20 mmol/L — ABNORMAL LOW (ref 22–32)
Calcium: 9.1 mg/dL (ref 8.9–10.3)
Creatinine, Ser: 5.07 mg/dL — ABNORMAL HIGH (ref 0.61–1.24)
GFR, EST AFRICAN AMERICAN: 13 mL/min — AB (ref >60)
GFR, EST NON AFRICAN AMERICAN: 11 mL/min — AB (ref >60)
Glucose, Bld: 114 mg/dL — ABNORMAL HIGH (ref 70–99)
PHOSPHORUS: 4.6 mg/dL (ref 2.5–4.6)
Potassium: 4.2 mmol/L (ref 3.5–5.1)
Sodium: 142 mmol/L (ref 135–145)

## 2018-07-21 LAB — MAGNESIUM: MAGNESIUM: 1.9 mg/dL (ref 1.7–2.4)

## 2018-07-21 LAB — GLUCOSE, CAPILLARY
GLUCOSE-CAPILLARY: 119 mg/dL — AB (ref 70–99)
Glucose-Capillary: 106 mg/dL — ABNORMAL HIGH (ref 70–99)

## 2018-07-21 MED ORDER — DEXTROSE-NACL 5-0.45 % IV SOLN
INTRAVENOUS | Status: DC
  Administered 2018-07-21 – 2018-07-25 (×7): via INTRAVENOUS

## 2018-07-21 NOTE — Progress Notes (Signed)
Occupational Therapy Treatment Patient Details Name: Christopher Dominguez MRN: [id number] DOB: June 19, 1961 Today's Date: 07/21/2018    History of present illness Pt is a 57 y.o. 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 CT 7/4 showed bilateral occipital edema and R frontoparietal edema, question anoxic brain injury or posterior reversible encephalopathy syndrome; no hemorrhage or midline shift. CT 7/5 showed no acute intracranial abnormality. ETT 7/4-7/10. Acute renal failure; CRRT stopped 7/12. PMH includes depression.   OT comments  This 57 yo male admitted with above presents to acute OT today making progress with LBADLs, grooming at sink, bed mobility, and toilet tranfers. Pt still is easily distracted and has a hard time concentrating when in a busy environment. He will continue to be benefit from acute OT with follow up OT at SNF. Family feel they can handle him without the use of W/C (was thinking this if pt had to get out of car on way to [place]). Handouts on anoxic brain injury were given to pt's sister and dicussed with her and pt's niece who was on the phone.    Follow Up Recommendations  SNF    Equipment Recommendations  Other (comment)(TBD next venue)       Precautions / Restrictions Precautions Precautions: Fall Restrictions Weight Bearing Restrictions: No       Mobility Bed Mobility Overal bed mobility: Needs Assistance Bed Mobility: Supine to Sit     Supine to sit: Min guard Sit to supine: Min guard   General bed mobility comments: mod cueing for bed mobility. able to supine<>sit with min guard for safety  Transfers Overall transfer level: Needs assistance Equipment used: 2 person hand held assist Transfers: Sit to/from Stand Sit to Stand: Min guard;+2 safety/equipment         General transfer comment: Min Guard A for safety    Balance Overall balance assessment: Needs  assistance Sitting-balance support: Feet supported;No upper extremity supported Sitting balance-Leahy Scale: Fair Sitting balance - Comments: pt able to sit EOB with tendency for propping on forearms, minguard for safety and balance     Standing balance-Leahy Scale: Poor Standing balance comment: bil UE support with assist for balance                           ADL either performed or assessed with clinical judgement   ADL Overall ADL's : Needs assistance/impaired     Grooming: Wash/dry hands;Wash/dry face;Minimal assistance Grooming Details (indicate cue type and reason): followed through with tasks once told the first time. Asked why he needed to wash his face. Made the comment that he was growing a beard               Lower Body Dressing Details (indicate cue type and reason): Pt able to don socks while seated EOB once socks were presented to him. When covers where taken off of pt and no socks, I asked him where his socks were ( he said: "there are over there" and looked to his right, but they weren't there. "Someone must have taken them" Toilet Transfer: Minimal assistance;Ambulation   Toileting- Clothing Manipulation and Hygiene: Total assistance Toileting - Clothing Manipulation Details (indicate cue type and reason): for back peri care; min A sit<>stand       General ADL Comments: Pt moderate to highly distractable; required re-direction at times. Gave sister in room handouts on managing pt due to anoxic brain injury.  Went over these with her and pt's niece who was on phone. Nice, aunt, and pt's son will be driving him back to [place] for rehab once there has been a place established. Niece works with mentally handicapped kids and adults and was somewhat familiar with the education I was going over with her.     Vision   Additional Comments: Pt on 2 occassions ran into objects with RW on his left. Did attempt to maneuver around first one          Cognition  Arousal/Alertness: Awake/alert Behavior During Therapy: Impulsive;Restless Overall Cognitive Status: Impaired/Different from baseline Area of Impairment: Orientation;Memory;Following commands;Safety/judgement;Problem solving;Attention                 Orientation Level: Disoriented to;Place;Time(knew his name, his birthday day (but not year or month), his sister's name that was in room, 2019--using calendar on wall)) Current Attention Level: Sustained(for washing face, and hands) Memory: Decreased short-term memory Following Commands: Follows one step commands inconsistently;Follows one step commands with increased time Safety/Judgement: Decreased awareness of safety;Decreased awareness of deficits   Problem Solving: Slow processing;Decreased initiation;Difficulty sequencing;Requires verbal cues;Requires tactile cues General Comments: Pt with improving cognition, able to follow instructoins with less re-direction, but unable to remember complex muliti step tasks. read calander with cues.  Asked to him to look down hallway and find exit signs-once he told me how many, asked him to walk to 2nd one and stop (pt went past it), tried again on return with pt repeating after me what he was suppose to do (again he went past it)                   Pertinent Vitals/ Pain       Pain Assessment: No/denies pain Pain Score: 0-No pain         Frequency  Min 2X/week        Progress Toward Goals  OT Goals(current goals can now be found in the care plan section)  Progress towards OT goals: Progressing toward goals  Acute Rehab OT Goals Patient Stated Goal: Pt unable to participate in goal setting   Plan Discharge plan remains appropriate    Co-evaluation    PT/OT/SLP Co-Evaluation/Treatment: Yes Reason for Co-Treatment: Complexity of the patient's impairments (multi-system involvement) PT goals addressed during session: Mobility/safety with mobility;Balance;Proper use of  DME;Strengthening/ROM OT goals addressed during session: ADL's and self-care      AM-PAC PT "6 Clicks" Daily Activity     Outcome Measure   Help from another person eating meals?: A Little Help from another person taking care of personal grooming?: A Little Help from another person toileting, which includes using toliet, bedpan, or urinal?: A Lot Help from another person bathing (including washing, rinsing, drying)?: A Lot Help from another person to put on and taking off regular upper body clothing?: A Lot Help from another person to put on and taking off regular lower body clothing?: A Little 6 Click Score: 15    End of Session Equipment Utilized During Treatment: Gait belt  OT Visit Diagnosis: Unsteadiness on feet (R26.81);Other abnormalities of gait and mobility (R26.89);Muscle weakness (generalized) (M62.81);Cognitive communication deficit (R41.841)   Activity Tolerance Patient tolerated treatment well   Patient Left in bed;with call bell/phone within reach           Time: 1400-1436 OT Time Calculation (min): 36 min  Charges: OT General Charges $OT Visit: 1 Visit OT Treatments $Self Care/Home Management : 8-22 mins  Ignacia Palmaathy Gordan Grell, OTR/L  [phone] 07/21/2018     

## 2018-07-21 NOTE — Care Management Important Message (Signed)
Important Message  Patient Details  Name: Christopher KaufmannShelton M Cozzolino MRN: [id number] Date of Birth: 01-18-1961   Medicare Important Message Given:  Yes    Yuji Walth P Mirren Gest 07/21/2018, 2:10 PM

## 2018-07-21 NOTE — Progress Notes (Signed)
Patient walked in halls today with Therapy continues to have incontinence, sister at bedside, iv fluids   changed to d5 1/2 ns, panel to watch kidneys and place to snf

## 2018-07-21 NOTE — Progress Notes (Signed)
[facility] ROUNDING NOTE   Subjective:    Wife at bedside  No complaints     Urine output 4700  cc   7/24 Tremendous diuresis  Objective:  Vital signs in last 24 hours:  Temp:  [98 F (36.7 C)-98.3 F (36.8 C)] 98.3 F (36.8 C) (07/25 0454) Pulse Rate:  [80-91] 80 (07/25 0454) Resp:  [18-20] 18 (07/25 0454) BP: (137-160)/(94-105) 137/96 (07/25 0454) SpO2:  [94 %-98 %] 94 % (07/25 0454) Weight:  [190 lb (86.2 kg)] 190 lb (86.2 kg) (07/25 0454)  Weight change: -4 lb 7.1 oz (-2.017 kg) Filed Weights   07/19/18 0342 07/20/18 0410 07/21/18 0454  Weight: 195 lb 5.2 oz (88.6 kg) 194 lb 7.1 oz (88.2 kg) 190 lb (86.2 kg)    Intake/Output: I/O last 3 completed shifts: In: 3595.8 [P.O.:960; I.V.:2635.8] Out: 5550 [Urine:5550]   Intake/Output this shift:  Total I/O In: 240 [P.O.:240] Out: -   Alert and cooperative   CVS- RRR   JVP  Not elevated at this time  RS- CTA  Bilaterally  ABD- BS present soft non-distended EXT- no edema   Basic Metabolic Panel: Recent Labs  Lab 07/17/18 0543 07/18/18 0625 07/19/18 0715 07/20/18 0434 07/21/18 0518  NA 140 145 146* 147* 142  K 4.9 5.0 4.6 4.5 4.2  CL 106 111 114* 117* 111  CO2 21* 21* 21* 21* 20*  GLUCOSE 106* 100* 100* 117* 114*  BUN 88* 83* 69* 58* 46*  CREATININE 13.83* 11.92* 8.97* 6.79* 5.07*  CALCIUM 8.2* 8.7* 9.0 9.2 9.1  MG 2.4 2.1 2.2 2.1 1.9  PHOS 6.4* 7.1* 6.5* 5.1* 4.6    Liver Function Tests: Recent Labs  Lab 07/17/18 0543 07/18/18 0625 07/19/18 0715 07/20/18 0434 07/21/18 0518  AST 17  --   --   --   --   ALT 23  --   --   --   --   ALKPHOS 52  --   --   --   --   BILITOT 0.5  --   --   --   --   PROT 5.8*  --   --   --   --   ALBUMIN 2.4*  2.4* 2.7* 2.6* 2.6* 2.8*   No results for input(s): LIPASE, AMYLASE in the last 168 hours. No results for input(s): AMMONIA in the last 168 hours.  CBC: Recent Labs  Lab 07/16/18 0559 07/17/18 0543 07/17/18 1415 07/18/18 0625  07/19/18 0715 07/20/18 0434  WBC 7.3 7.7  --  6.3 6.4 6.4  HGB 8.2* 7.5* 8.0* 8.5* 8.6* 8.8*  HCT 26.7* 24.1* 26.0* 28.4* 28.8* 29.3*  MCV 71.8* 71.1*  --  72.1* 72.4* 72.5*  PLT 479* 476*  --  477* 492* 490*    Cardiac Enzymes: No results for input(s): CKTOTAL, CKMB, CKMBINDEX, TROPONINI in the last 168 hours.  BNP: Invalid input(s): POCBNP  CBG: Recent Labs  Lab 07/20/18 1540 07/20/18 2012 07/20/18 2300 07/21/18 0453 07/21/18 0740  GLUCAP 107* 107* 114* 119* 106*    Microbiology: Results for orders placed or performed during the hospital encounter of 06/30/18  MRSA PCR Screening     Status: None   Collection Time: 06/30/18  5:42 PM  Result Value Ref Range Status   MRSA by PCR NEGATIVE NEGATIVE Final    Comment:        The GeneXpert MRSA Assay (FDA approved for NASAL specimens only), is one component of a comprehensive MRSA colonization surveillance program. It  is not intended to diagnose MRSA infection nor to guide or monitor treatment for MRSA infections. Performed at [id number], [address], [place], [place] [address]   C difficile quick scan w PCR reflex     Status: None   Collection Time: 06/30/18  8:33 PM  Result Value Ref Range Status   C Diff antigen NEGATIVE NEGATIVE Final   C Diff toxin NEGATIVE NEGATIVE Final   C Diff interpretation No C. difficile detected.  Final  Culture, Urine     Status: None   Collection Time: 07/15/18  4:27 PM  Result Value Ref Range Status   Specimen Description URINE, CLEAN CATCH  Final   Special Requests NONE  Final   Culture   Final    NO GROWTH Performed at [facility], [address], [place], [place] [address]    Report Status 07/16/2018 FINAL  Final    Coagulation Studies: No results for input(s): LABPROT, INR in the last 72 hours.  Urinalysis: No results for input(s): COLORURINE, LABSPEC, PHURINE, GLUCOSEU, HGBUR, BILIRUBINUR, KETONESUR, PROTEINUR, UROBILINOGEN, NITRITE, LEUKOCYTESUR in  the last 72 hours.  Invalid input(s): APPERANCEUR    Imaging: No results found.   Medications:   . dextrose 5 % and 0.2 % NaCl 75 mL/hr at 07/21/18 0147  . ferumoxytol     . amLODipine  10 mg Oral Daily  . chlorhexidine  15 mL Mouth Rinse BID  . darbepoetin (ARANESP) injection - NON-DIALYSIS  100 mcg Subcutaneous Q Mon-1800  . feeding supplement (NEPRO CARB STEADY)  237 mL Oral BID BM  . ferrous sulfate  325 mg Oral Q breakfast  . heparin injection (subcutaneous)  5,000 Units Subcutaneous Q8H  . pantoprazole  40 mg Oral Q1200  . tamsulosin  0.4 mg Oral Daily   alteplase, heparin, heparin, heparin, heparin, heparin, heparin, heparin, heparin, hydrALAZINE, lidocaine (PF), lidocaine-prilocaine, lip balm, pentafluoroprop-tetrafluoroeth  Assessment/ Plan:   Non oliguric acute kidney injury  Secondary to cardiac arrest   S/p CRRT and has had HD prn   Last dialysis session 7/17  Appears to have recovery of renal function with a decrease in serum creatinine.Baseline creatinine 1.23  ( 5/17)    Continue to hold on dialysis at this point - dialysis catheter removed   Volume status  Now no edema and appears to have been weaned to room air  - no issues with volume overload appears to be stable   Cardiac arrest  - myoview study showed septal and inferior wall inducible ischemia - history of illicit drug use and no plans to pursue cath at this time  Seen by Dr Cristal Deer and will follow up after discharge   EF improved to 55%   Anoxic brain injury   Will need rehabilitation  Appears stable although waxes and wanes   BPH  Seen by urology  No obstruction  Continue on flomax   Anemia  Hemoglobin fairly stable   8.8   started  Darbepoietin ( no transfusions )   Hypernatremia   Resolved with 1/4 NS   Change to 1/2 NS       It looks like we could sign off at this point  I anticipate recovery  He could follow up with [facility] on discharge   I think a [place] clinic appointment would be  reasonable    LOS: 21 [place] Fickling W @TODAY @11 :04 AM

## 2018-07-21 NOTE — Progress Notes (Signed)
Order for restraints discontinued per MD.Patient sister at bedside and green mitts on to keep patient from pulling out invasive lines.Patient bed alarm on for safety.

## 2018-07-21 NOTE — Clinical Social Work Note (Addendum)
No call back. Left another voicemail for admissions coordinator at [facility] [place].  Charlynn CourtSarah Tallula Grindle, CSW [phone]  12:13 pm SNF secretary forwarded CSW to central intake dept. CSW left voicemail.  Charlynn CourtSarah Cru Kritikos, CSW [phone]  2:04 pm CSW spoke with Central Intake dept at [facility] and faxed referral for review.  Charlynn CourtSarah Johnanna Bakke, CSW [phone]

## 2018-07-21 NOTE — Progress Notes (Signed)
PROGRESS NOTE    Christopher Dominguez  QMV:[id number] DOB: 10-06-1961 DOA: 06/30/2018 PCP: Patient, No Pcp Per   Brief Narrative:  57 yo with no significant past medical history, originally from [place] admitted to critical care on 7/4 following a cardiac arrest. -Hospitalization complicated by acute kidney injury requiring hemodialysis, chorea post extubation, encephalopathy from anoxic brain injury, cardiomyopathy with EF down to 25% which recovered to 55% range in 3 days on repeat echo. -Transferred from PCCM to hospitalist service 7/16  He's now s/p stress test which was positive by radiology read, but cardiology disagreed with rad interpretation and recommending outpatient follow up.  His kidney function is improving and his temporary dialysis catheter has been removed.  He continues to have waxing/waning confusion.  Will likely be appropriate for SNF placement within a few days.    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  CXR without active cardiopulm dz.  Follow urinalysis and culture (no growth).  Will obtain blood cx if recurrent temp.  Hold off on abx for now.  Afebrile since, follow. 07/20/2018: No further fever documented.  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  Recommending proceeding with rehab and f/u outpatient  with Dr. Cristal Deer. -Possible discharge to rehab facility versus skilled nursing facility in the next few days.    Acute respiratory failure with hypoxemia (HCC) - following cardiac arrest - Extubated 7/10 - Also treated for aspiration pneumonia, completed antibiotics - Continue pulmonary toilet, incentive spirometer, nebs as needed - Currently doing well on room air   Encephalopathy  anoxic brain injury  Post Hypoxic Chroea -seen by neurology, noted to have waxing and waning mental status (this has continued as I've seen him as well) - delirium precautions -Haldol when necessary (recommended not continuing this long term), gabapentin could be tried as an alternative if movements are interfering with rehab and less sedating alternative needed (gabapentin 300 mg BID -Will need long-term rehabilitation and outpatient neurology follow-up (if persistent deficits and abnormal movements after rehab) -SLP following on dysphagia 2 diet - Therapy recommending CIR, but sounds like family may opt for SNF  Acute renal failure -Due to cardiac arrest, shock -Renal following, was getting CRRT - intermittent HD now, has right temporary IJ HD catheter - Holding dialysis with fall in creatinine and improved UOP.  IVF per nephrology (pt developing hypernatremia, IVF increased to 150 - previously on 1/2 NS 125 cc/hr). - 3.4 L UOP yesterday - Dialysis catheter removed 7/23 -Continue to monitor. -Continues to improve.  Nausea  Vomiting: occurred x1 today and a couple of days ago as well.  Having BM, no tenderness on exam.  Will ctm for now.  Will repeat EKG for QT eval (prolonged initially - now improved) in case he needs antiemetics. 07/20/2018: None reported  Hematuria/enlarged prostate -Seen by  urology -Given very large prostate recommended to avoid Foley unless residuals greater than 500 -continue Flomax   Transient rectal bleeding -Resolved, seen by GI, supportive care for  now -Subcutaneous heparin held -> now resumed, continue SCDs for DVT prophylaxis   shock liver -Transaminitis improved -Trend LFTs  Anemia: stable.  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  Feraheme given 7/22 and 2nd dose ordered by renal for 7/25.  Renal giving epo.  Hypertension: increase amlodipine to 10 mg   DVT prophylaxis: heparin Code Status: full  Family Communication: sister at bedside Disposition Plan: likely to d/c to SNF soon with continued improvement in renal function    Consultants:   Renal  Cards  GI  Urology  Procedures:   R IJ HD catheter CRRT followed by IHD  07/04/18 Study Conclusions  - Left ventricle: The cavity size was normal. Wall thickness was   normal. Systolic function was normal. The estimated ejection   fraction was in the range of 55% to 60%. Wall motion was normal;   there were no regional wall motion abnormalities. - Aortic root: The aortic root was mildly dilated. - Right ventricle: The cavity size was mildly dilated.  Impressions:  - Normal LV function; mildly dilated aortic root; mild RVE.  07/01/18 Study Conclusions  - Left ventricle: The cavity size was normal. Wall thickness was   increased in a pattern of mild LVH. Systolic function was   severely reduced. The estimated ejection fraction was in the   range of 25% to 30%. Diffuse hypokinesis. Doppler parameters are   consistent with abnormal left ventricular relaxation (grade 1   diastolic dysfunction). - Aortic root: The aortic root was mildly dilated. - Mitral valve: Calcified annulus. - Right ventricle: The cavity size was mildly dilated. Systolic   function was severely reduced. - Right atrium: The atrium was mildly dilated.  Impressions:  - Technically difficult; definity used; severe LV dysfunction; mild   LVH; mild diastolic dysfunction; enlarged right side with   moderate to severe RV dysfunction.  7/5 Final Interpretation: Right:  There is no evidence of deep vein thrombosis in the lower extremity. No cystic structure found in the popliteal fossa. Left: There is no evidence of deep vein thrombosis in the lower extremity. However, portions of this examination were limited- see technologist comments above. No cystic structure found in the popliteal fossa.  Antimicrobials:  Anti-infectives (From admission, onward)   Start     Dose/Rate Route Frequency Ordered Stop   07/06/18 1200  piperacillin-tazobactam (ZOSYN) IVPB 3.375 g    Note to Pharmacy:  Please stop after today   3.375 g 100 mL/hr over 30 Minutes Intravenous Every 6 hours 07/06/18 1015 07/06/18 1932   07/02/18 1800  piperacillin-tazobactam (ZOSYN) IVPB 3.375 g  Status:  Discontinued     3.375 g 12.5 mL/hr over 240 Minutes Intravenous Every 6 hours 07/02/18 1309 07/02/18 1310   07/02/18 1800  piperacillin-tazobactam (ZOSYN) IVPB 3.375 g  Status:  Discontinued     3.375 g 100 mL/hr over 30 Minutes Intravenous Every 6 hours 07/02/18 1311 07/06/18 1015   07/02/18 1200  piperacillin-tazobactam (ZOSYN) IVPB 2.25 g  Status:  Discontinued     2.25 g 100 mL/hr over 30 Minutes Intravenous Every 6 hours 07/02/18 0728 07/02/18 1309   07/01/18 0100  piperacillin-tazobactam (ZOSYN) IVPB 3.375 g  Status:  Discontinued     3.375 g 12.5 mL/hr over 240 Minutes Intravenous Every 8 hours 06/30/18 1812 07/02/18 [id number]  06/30/18 1815  piperacillin-tazobactam (ZOSYN) IVPB 3.375 g     3.375 g 100 mL/hr over 30 Minutes Intravenous  Once 06/30/18 1804 06/30/18 1945      Subjective: No history from patient. No new problems.  Objective: Vitals:   07/20/18 1151 07/20/18 2011 07/21/18 0454 07/21/18 1235  BP: (!) 160/105 (!) 144/94 (!) 137/96 (!) 130/92  Pulse: 91 91 80 97  Resp: 20 18 18 18   Temp: 98 F (36.7 C) 98.2 F (36.8 C) 98.3 F (36.8 C) 98.5 F (36.9 C)  TempSrc: Oral Oral Oral Oral  SpO2: 97% 98% 94% 98%  Weight:   86.2 kg (190 lb)   Height:         Intake/Output Summary (Last 24 hours) at 07/21/2018 1930 Last data filed at 07/21/2018 1848 Gross per 24 hour  Intake 1061.25 ml  Output 1900 ml  Net -838.75 ml   Filed Weights   07/19/18 0342 07/20/18 0410 07/21/18 0454  Weight: 88.6 kg (195 lb 5.2 oz) 88.2 kg (194 lb 7.1 oz) 86.2 kg (190 lb)    Examination:  General: No acute distress. Cardiovascular: S1-S2 Lungs: Clear to auscultation bilaterally with good air movement.  Abdomen: Soft, nontender, nondistended Neurological: Awake and alert.  Patient moves all limbs.  Encephalopathy persists.   Extremities: No leg edema.    Data Reviewed: I have personally reviewed following labs and imaging studies  CBC: Recent Labs  Lab 07/16/18 0559 07/17/18 0543 07/17/18 1415 07/18/18 0625 07/19/18 0715 07/20/18 0434  WBC 7.3 7.7  --  6.3 6.4 6.4  HGB 8.2* 7.5* 8.0* 8.5* 8.6* 8.8*  HCT 26.7* 24.1* 26.0* 28.4* 28.8* 29.3*  MCV 71.8* 71.1*  --  72.1* 72.4* 72.5*  PLT 479* 476*  --  477* 492* 490*   Basic Metabolic Panel: Recent Labs  Lab 07/17/18 0543 07/18/18 0625 07/19/18 0715 07/20/18 0434 07/21/18 0518  NA 140 145 146* 147* 142  K 4.9 5.0 4.6 4.5 4.2  CL 106 111 114* 117* 111  CO2 21* 21* 21* 21* 20*  GLUCOSE 106* 100* 100* 117* 114*  BUN 88* 83* 69* 58* 46*  CREATININE 13.83* 11.92* 8.97* 6.79* 5.07*  CALCIUM 8.2* 8.7* 9.0 9.2 9.1  MG 2.4 2.1 2.2 2.1 1.9  PHOS 6.4* 7.1* 6.5* 5.1* 4.6   GFR: Estimated Creatinine Clearance: 16.9 mL/min (A) (by C-G formula based on SCr of 5.07 mg/dL (H)). Liver Function Tests: Recent Labs  Lab 07/17/18 0543 07/18/18 1610 07/19/18 0715 07/20/18 0434 07/21/18 0518  AST 17  --   --   --   --   ALT 23  --   --   --   --   ALKPHOS 52  --   --   --   --   BILITOT 0.5  --   --   --   --   PROT 5.8*  --   --   --   --   ALBUMIN 2.4*  2.4* 2.7* 2.6* 2.6* 2.8*   No results for input(s): LIPASE, AMYLASE in the last 168 hours. No results for input(s): AMMONIA in the last 168  hours. Coagulation Profile: No results for input(s): INR, PROTIME in the last 168 hours. Cardiac Enzymes: No results for input(s): CKTOTAL, CKMB, CKMBINDEX, TROPONINI in the last 168 hours. BNP (last 3 results) No results for input(s): PROBNP in the last 8760 hours. HbA1C: No results for input(s): HGBA1C in the last 72 hours. CBG: Recent Labs  Lab 07/20/18 1540 07/20/18 2012 07/20/18  2300 07/21/18 0453 07/21/18 0740  GLUCAP 107* 107* 114* 119* 106*   Lipid Profile: No results for input(s): CHOL, HDL, LDLCALC, TRIG, CHOLHDL, LDLDIRECT in the last 72 hours. Thyroid Function Tests: No results for input(s): TSH, T4TOTAL, FREET4, T3FREE, THYROIDAB in the last 72 hours. Anemia Panel: No results for input(s): VITAMINB12, FOLATE, FERRITIN, TIBC, IRON, RETICCTPCT in the last 72 hours. Sepsis Labs: No results for input(s): PROCALCITON, LATICACIDVEN in the last 168 hours.  Recent Results (from the past 240 hour(s))  Culture, Urine     Status: None   Collection Time: 07/15/18  4:27 PM  Result Value Ref Range Status   Specimen Description URINE, CLEAN CATCH  Final   Special Requests NONE  Final   Culture   Final    NO GROWTH Performed at [facility], [address], [place], [place] [address]    Report Status 07/16/2018 FINAL  Final         Radiology Studies: No results found.      Scheduled Meds: . amLODipine  10 mg Oral Daily  . chlorhexidine  15 mL Mouth Rinse BID  . darbepoetin (ARANESP) injection - NON-DIALYSIS  100 mcg Subcutaneous Q Mon-1800  . feeding supplement (NEPRO CARB STEADY)  237 mL Oral BID BM  . ferrous sulfate  325 mg Oral Q breakfast  . heparin injection (subcutaneous)  5,000 Units Subcutaneous Q8H  . pantoprazole  40 mg Oral Q1200  . tamsulosin  0.4 mg Oral Daily   Continuous Infusions: . dextrose 5 % and 0.45% NaCl 75 mL/hr at 07/21/18 1516  . ferumoxytol       LOS: 21 days    Time spent: over 30 min    Barnetta Chapel,  MD [facility] Pager [phone].  If 7PM-7AM, please contact night-coverage [email or web address] Password [email or web address] 07/21/2018, 7:30 PM

## 2018-07-21 NOTE — Progress Notes (Signed)
Physical Therapy Treatment Patient Details Name: Christopher KaufmannShelton M Dominguez MRN: [id number] DOB: 10-08-1961 Today's Date: 07/21/2018    History of Present Illness Pt is a 57 y.o. 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 CT 7/4 showed bilateral occipital edema and R frontoparietal edema, question anoxic brain injury or posterior reversible encephalopathy syndrome; no hemorrhage or midline shift. CT 7/5 showed no acute intracranial abnormality. ETT 7/4-7/10. Acute renal failure; CRRT stopped 7/12. PMH includes depression.    PT Comments    Patient demonstrating improvement with PT at this time. Cognition slowly improving yet still confused and inappropriate, following 60-70% of one step commands with cues needed at times. Currently unsafe to ambulate without RW with hands on physical guarding at this time due to balance deficits, one LOB requiring mod A to stabilize this visit. Educated family this session on strategies  to improve safety and communication with patient in preparation for their drive to SNF in [place].     Follow Up Recommendations  SNF;Supervision/Assistance - 24 hour(family denied CIR)     Equipment Recommendations  Rolling walker with 5" wheels    Recommendations for Other Services OT consult;Speech consult;Rehab consult     Precautions / Restrictions Precautions Precautions: Fall Restrictions Weight Bearing Restrictions: No    Mobility  Bed Mobility Overal bed mobility: Needs Assistance Bed Mobility: Supine to Sit     Supine to sit: Min guard Sit to supine: Min guard   General bed mobility comments: mod cueing for bed mobility. able to supine<>sit with min guard for safety  Transfers Overall transfer level: Needs assistance Equipment used: 2 person hand held assist Transfers: Sit to/from Stand Sit to Stand: Min guard;+2 safety/equipment         General transfer comment: Min Guard A for  safety  Ambulation/Gait Ambulation/Gait assistance: Mod assist;+2 safety/equipment Gait Distance (Feet): 250 Feet Assistive device: Rolling walker (2 wheeled) Gait Pattern/deviations: Narrow base of support;Step-through pattern;Decreased stride length;Drifts right/left Gait velocity: decreased   General Gait Details: Patient unable to safely walk without RW and hands on guarding this session. took several steps without RW in room with LOB required mod A to stabilize. Pt with poor obsticle avoidence with RW in hallway in quiet enviroment. Pt given some cognitive tasks such as "stop at the exist sign", pt agrees he will then fails to complete task correctly or recall instruction from 30 seconds prior. HR max 130.    Stairs             Wheelchair Mobility    Modified Rankin (Stroke Patients Only)       Balance Overall balance assessment: Needs assistance Sitting-balance support: Feet supported;No upper extremity supported Sitting balance-Leahy Scale: Fair Sitting balance - Comments: pt able to sit EOB with tendency for propping on forearms, minguard for safety and balance     Standing balance-Leahy Scale: Poor Standing balance comment: bil UE support with assist for balance                            Cognition Arousal/Alertness: Awake/alert Behavior During Therapy: Impulsive;Restless Overall Cognitive Status: Impaired/Different from baseline Area of Impairment: Orientation;Attention;Memory;Following commands;Safety/judgement;Problem solving                 Orientation Level: Disoriented to;Person;Place;Time;Situation Current Attention Level: Focused Memory: Decreased recall of precautions;Decreased short-term memory Following Commands: Follows one step commands inconsistently Safety/Judgement: Decreased awareness of safety;Decreased awareness of deficits  Problem Solving: Slow processing;Decreased initiation;Difficulty sequencing;Requires verbal  cues;Requires tactile cues General Comments: Pt with improving cognition, able to follow instructoins with less re-direction, but unable to remeber complex muliti step tasks. read calander with cues.        Exercises      General Comments        Pertinent Vitals/Pain Pain Assessment: No/denies pain Pain Score: 0-No pain    Home Living                      Prior Function            PT Goals (current goals can now be found in the care plan section) Acute Rehab PT Goals Patient Stated Goal: Pt unable to participate in goal setting  PT Goal Formulation: With family Time For Goal Achievement: 07/23/18 Potential to Achieve Goals: Good Progress towards PT goals: Progressing toward goals    Frequency    Min 3X/week      PT Plan Current plan remains appropriate    Co-evaluation PT/OT/SLP Co-Evaluation/Treatment: Yes Reason for Co-Treatment: Complexity of the patient's impairments (multi-system involvement) PT goals addressed during session: Mobility/safety with mobility;Balance;Proper use of DME;Strengthening/ROM        AM-PAC PT "6 Clicks" Daily Activity  Outcome Measure  Difficulty turning over in bed (including adjusting bedclothes, sheets and blankets)?: A Little Difficulty moving from lying on back to sitting on the side of the bed? : A Little Difficulty sitting down on and standing up from a chair with arms (e.g., wheelchair, bedside commode, etc,.)?: Unable Help needed moving to and from a bed to chair (including a wheelchair)?: A Lot Help needed walking in hospital room?: A Lot Help needed climbing 3-5 steps with a railing? : A Lot 6 Click Score: 13    End of Session Equipment Utilized During Treatment: Gait belt Activity Tolerance: Patient tolerated treatment well Patient left: in chair;with call bell/phone within reach;with chair alarm set;with restraints reapplied Nurse Communication: Mobility status;Precautions PT Visit Diagnosis: Other  abnormalities of gait and mobility (R26.89);Other symptoms and signs involving the nervous system (R29.898);Unsteadiness on feet (R26.81)     Time: 1610-9604 PT Time Calculation (min) (ACUTE ONLY): 37 min  Charges:  $Gait Training: 8-22 mins                     Etta Grandchild, PT, DPT Acute Rehab Services Pager: [phone]     Etta Grandchild 07/21/2018, 2:52 PM

## 2018-07-22 LAB — RENAL FUNCTION PANEL
Albumin: 2.8 g/dL — ABNORMAL LOW (ref 3.5–5.0)
Anion gap: 11 (ref 5–15)
BUN: 42 mg/dL — ABNORMAL HIGH (ref 6–20)
CO2: 21 mmol/L — ABNORMAL LOW (ref 22–32)
Calcium: 9.5 mg/dL (ref 8.9–10.3)
Chloride: 112 mmol/L — ABNORMAL HIGH (ref 98–111)
Creatinine, Ser: 3.93 mg/dL — ABNORMAL HIGH (ref 0.61–1.24)
GFR calc Af Amer: 18 mL/min — ABNORMAL LOW
GFR calc non Af Amer: 15 mL/min — ABNORMAL LOW
Glucose, Bld: 105 mg/dL — ABNORMAL HIGH (ref 70–99)
Phosphorus: 4.8 mg/dL — ABNORMAL HIGH (ref 2.5–4.6)
Potassium: 4.1 mmol/L (ref 3.5–5.1)
Sodium: 144 mmol/L (ref 135–145)

## 2018-07-22 LAB — CBC
HCT: 29.9 % — ABNORMAL LOW (ref 39.0–52.0)
Hemoglobin: 9 g/dL — ABNORMAL LOW (ref 13.0–17.0)
MCH: 22.1 pg — ABNORMAL LOW (ref 26.0–34.0)
MCHC: 30.1 g/dL (ref 30.0–36.0)
MCV: 73.3 fL — ABNORMAL LOW (ref 78.0–100.0)
Platelets: 472 10*3/uL — ABNORMAL HIGH (ref 150–400)
RBC: 4.08 MIL/uL — ABNORMAL LOW (ref 4.22–5.81)
RDW: 16.5 % — ABNORMAL HIGH (ref 11.5–15.5)
WBC: 7 10*3/uL (ref 4.0–10.5)

## 2018-07-22 LAB — MAGNESIUM: Magnesium: 1.9 mg/dL (ref 1.7–2.4)

## 2018-07-22 NOTE — Progress Notes (Signed)
Returned page  Leonette Mostharles B. NP aware  No new orders, will continue to monitor pt  Family at bedside, call light in reach, bed alarm on

## 2018-07-22 NOTE — Progress Notes (Signed)
Second page to MD, awaiting call back   Ordered pt low bed

## 2018-07-22 NOTE — Progress Notes (Signed)
  Speech Language Pathology Treatment: Cognitive-Linquistic  Patient Details Name: Christopher Dominguez MRN: [id number] DOB: September 06, 1961 Today's Date: 07/22/2018 Time: 1300-1308 SLP Time Calculation (min) (ACUTE ONLY): 8 min  Assessment / Plan / Recommendation Clinical Impression  Skilled treatment session focused on education with sister d/t pt reluctant to remove covers from his head. Education provided on fluctuating confusion, resistance to care and decreased ability to redirect pt to appropriate tasks during those moments. Sister appears to have understanding of cognitive deficits and length of recovery time. All questions answered to her satisfaction.    HPI HPI: 57 y/o male had a cardiac arrest out of hospital and received 7 rounds of epinephrine to regain a pulse. Tox positive for cocaine. Intubated from 7/4-7/10. Puree diet initated with upgrade to dysphagia 2 on 7/15      SLP Plan  Continue with current plan of care       Recommendations                   Oral Care Recommendations: Oral care BID Follow up Recommendations: Skilled Nursing facility SLP Visit Diagnosis: Cognitive communication deficit (W09.811(R41.841) Plan: Continue with current plan of care       GO                Reighlynn Swiney 07/22/2018, 2:26 PM

## 2018-07-22 NOTE — Clinical Social Work Note (Addendum)
CSW attempted calling SNF to follow up on referral. No answer. Will try again later.  Charlynn CourtSarah Vern Guerette, CSW [phone]  1:13 pm CSW spoke with central intake at SNF. Individual that handles new referrals is not in right now. They will follow up later today.  Charlynn CourtSarah Teven Mittman, CSW [phone]  2:59 pm Left voicemail for central intake.  Charlynn CourtSarah Zyen Triggs, CSW [phone]

## 2018-07-22 NOTE — Progress Notes (Signed)
PROGRESS NOTE    AMY BELLOSO  ZOX:[id number] DOB: [birth year] DOA: 06/30/2018 PCP: Patient, No Pcp Per   Brief Narrative:  57 yo with no significant past medical history, originally from [place] admitted to critical care on 7/4 following a cardiac arrest. -Hospitalization complicated by acute kidney injury requiring hemodialysis, chorea post extubation, encephalopathy from anoxic brain injury, cardiomyopathy with EF down to 25% which recovered to 55% range in 3 days on repeat echo. -Transferred from PCCM to hospitalist service 7/16  He's now s/p stress test which was positive by radiology read, but cardiology disagreed with rad interpretation and recommending outpatient follow up.  His kidney function is improving and his temporary dialysis catheter has been removed.  He continues to have waxing/waning confusion.  Will likely be appropriate for SNF placement within a few days.    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  CXR without active cardiopulm dz.  Follow urinalysis and culture (no growth).  Will obtain blood cx if recurrent temp.  Hold off on abx for now.  Afebrile since, follow. 07/22/2018: No further fever documented.  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  Recommending proceeding with rehab and f/u outpatient  with Dr. Cristal Deer. -Possible discharge to rehab facility versus skilled nursing facility in the next few days.    Acute respiratory failure with hypoxemia (HCC) - following cardiac arrest - Extubated 7/10 - Also treated for aspiration pneumonia, completed antibiotics - Continue pulmonary toilet, incentive spirometer, nebs as needed - Currently doing well on room air   Encephalopathy  anoxic brain injury  Post Hypoxic Chroea -seen by neurology, noted to have waxing and waning mental status (this has continued as I've seen him as well) - delirium precautions -Haldol when necessary (recommended not continuing this long term), gabapentin could be tried as an alternative if movements are interfering with rehab and less sedating alternative needed (gabapentin 300 mg BID -Will need long-term rehabilitation and outpatient neurology follow-up (if persistent deficits and abnormal movements after rehab) -SLP following on dysphagia 2 diet - Therapy recommending CIR, but sounds like family may opt for SNF  Acute renal failure -Due to cardiac arrest, shock -Renal following, was getting CRRT - intermittent HD now, has right temporary IJ HD catheter - Holding dialysis with fall in creatinine and improved UOP.  IVF per nephrology (pt developing hypernatremia, IVF increased to 150 - previously on 1/2 NS 125 cc/hr). - 3.4 L UOP yesterday - Dialysis catheter removed 7/23 -Continue to monitor. -Continues to improve.  Nausea  Vomiting: occurred x1 today and a couple of days ago as well.  Having BM, no tenderness on exam.  Will ctm for now.  Will repeat EKG for QT eval (prolonged initially - now improved) in case he needs antiemetics. 07/20/2018: None reported  Hematuria/enlarged prostate -Seen by  urology -Given very large prostate recommended to avoid Foley unless residuals greater than 500 -continue Flomax   Transient rectal bleeding -Resolved, seen by GI, supportive care for  now -Subcutaneous heparin held -> now resumed, continue SCDs for DVT prophylaxis   shock liver -Transaminitis improved -Trend LFTs  Anemia: stable.  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  Feraheme given 7/22 and 2nd dose ordered by renal for 7/25.  Renal giving epo.  Hypertension: increase amlodipine to 10 mg   DVT prophylaxis: heparin Code Status: full  Family Communication: sister at bedside Disposition Plan: likely to d/c to SNF soon with continued improvement in renal function    Consultants:   Renal  Cards  GI  Urology  Procedures:   R IJ HD catheter CRRT followed by IHD  07/04/18 Study Conclusions  - Left ventricle: The cavity size was normal. Wall thickness was   normal. Systolic function was normal. The estimated ejection   fraction was in the range of 55% to 60%. Wall motion was normal;   there were no regional wall motion abnormalities. - Aortic root: The aortic root was mildly dilated. - Right ventricle: The cavity size was mildly dilated.  Impressions:  - Normal LV function; mildly dilated aortic root; mild RVE.  07/01/18 Study Conclusions  - Left ventricle: The cavity size was normal. Wall thickness was   increased in a pattern of mild LVH. Systolic function was   severely reduced. The estimated ejection fraction was in the   range of 25% to 30%. Diffuse hypokinesis. Doppler parameters are   consistent with abnormal left ventricular relaxation (grade 1   diastolic dysfunction). - Aortic root: The aortic root was mildly dilated. - Mitral valve: Calcified annulus. - Right ventricle: The cavity size was mildly dilated. Systolic   function was severely reduced. - Right atrium: The atrium was mildly dilated.  Impressions:  - Technically difficult; definity used; severe LV dysfunction; mild   LVH; mild diastolic dysfunction; enlarged right side with   moderate to severe RV dysfunction.  7/5 Final Interpretation: Right:  There is no evidence of deep vein thrombosis in the lower extremity. No cystic structure found in the popliteal fossa. Left: There is no evidence of deep vein thrombosis in the lower extremity. However, portions of this examination were limited- see technologist comments above. No cystic structure found in the popliteal fossa.  Antimicrobials:  Anti-infectives (From admission, onward)   Start     Dose/Rate Route Frequency Ordered Stop   07/06/18 1200  piperacillin-tazobactam (ZOSYN) IVPB 3.375 g    Note to Pharmacy:  Please stop after today   3.375 g 100 mL/hr over 30 Minutes Intravenous Every 6 hours 07/06/18 1015 07/06/18 1932   07/02/18 1800  piperacillin-tazobactam (ZOSYN) IVPB 3.375 g  Status:  Discontinued     3.375 g 12.5 mL/hr over 240 Minutes Intravenous Every 6 hours 07/02/18 1309 07/02/18 1310   07/02/18 1800  piperacillin-tazobactam (ZOSYN) IVPB 3.375 g  Status:  Discontinued     3.375 g 100 mL/hr over 30 Minutes Intravenous Every 6 hours 07/02/18 1311 07/06/18 1015   07/02/18 1200  piperacillin-tazobactam (ZOSYN) IVPB 2.25 g  Status:  Discontinued     2.25 g 100 mL/hr over 30 Minutes Intravenous Every 6 hours 07/02/18 0728 07/02/18 1309   07/01/18 0100  piperacillin-tazobactam (ZOSYN) IVPB 3.375 g  Status:  Discontinued     3.375 g 12.5 mL/hr over 240 Minutes Intravenous Every 8 hours 06/30/18 1812 07/02/18 [id number]  06/30/18 1815  piperacillin-tazobactam (ZOSYN) IVPB 3.375 g     3.375 g 100 mL/hr over 30 Minutes Intravenous  Once 06/30/18 1804 06/30/18 1945      Subjective: No new complaints Awaiting disposition.   Objective: Vitals:   07/21/18 2052 07/21/18 2052 07/22/18 0527 07/22/18 1209  BP:  (!) 149/87 (!) 149/91 (!) 141/99  Pulse:  86 82 89  Resp:  17 18 18   Temp: 98.6 F (37 C)  98.1 F (36.7 C) 98.6 F (37 C)  TempSrc: Oral  Oral Oral  SpO2:  97% 100% 99%  Weight:   87.8 kg (193 lb 9 oz)   Height:        Intake/Output Summary (Last 24 hours) at  07/22/2018 1359 Last data filed at 07/22/2018 1209 Gross per 24 hour  Intake 4183.87 ml  Output 2200 ml  Net 1983.87 ml   Filed Weights   07/20/18 0410 07/21/18 0454 07/22/18 0527  Weight: 88.2 kg (194 lb 7.1 oz) 86.2 kg (190 lb) 87.8 kg (193 lb 9 oz)    Examination:  General: No acute distress. Cardiovascular: S1-S2 Lungs: Clear to auscultation bilaterally with good air movement.  Abdomen: Soft, nontender, nondistended Neurological: Awake and alert.  Patient moves all limbs.  Encephalopathy persists.   Extremities: No leg edema.    Data Reviewed: I have personally reviewed following labs and imaging studies  CBC: Recent Labs  Lab 07/17/18 0543 07/17/18 1415 07/18/18 0625 07/19/18 0715 07/20/18 0434 07/22/18 0552  WBC 7.7  --  6.3 6.4 6.4 7.0  HGB 7.5* 8.0* 8.5* 8.6* 8.8* 9.0*  HCT 24.1* 26.0* 28.4* 28.8* 29.3* 29.9*  MCV 71.1*  --  72.1* 72.4* 72.5* 73.3*  PLT 476*  --  477* 492* 490* 472*   Basic Metabolic Panel: Recent Labs  Lab 07/18/18 0625 07/19/18 0715 07/20/18 0434 07/21/18 0518 07/22/18 0552  NA 145 146* 147* 142 144  K 5.0 4.6 4.5 4.2 4.1  CL 111 114* 117* 111 112*  CO2 21* 21* 21* 20* 21*  GLUCOSE 100* 100* 117* 114* 105*  BUN 83* 69* 58* 46* 42*  CREATININE 11.92* 8.97* 6.79* 5.07* 3.93*  CALCIUM 8.7* 9.0 9.2 9.1 9.5  MG 2.1 2.2 2.1 1.9 1.9  PHOS 7.1* 6.5* 5.1* 4.6 4.8*   GFR: Estimated Creatinine Clearance: 21.8 mL/min (A) (by C-G formula based on SCr of 3.93 mg/dL (H)). Liver Function Tests: Recent Labs  Lab 07/17/18 0543 07/18/18 [id number] 07/19/18 0715 07/20/18 0434 07/21/18 0518 07/22/18 0552  AST 17  --   --   --   --   --   ALT 23  --   --   --   --   --   ALKPHOS 52  --   --   --   --   --   BILITOT 0.5  --   --   --   --   --   PROT 5.8*  --   --   --   --   --   ALBUMIN 2.4*  2.4* 2.7* 2.6* 2.6* 2.8* 2.8*   No results for input(s): LIPASE, AMYLASE in the last 168 hours. No results for input(s): AMMONIA in the last 168  hours. Coagulation Profile: No results for input(s): INR, PROTIME in the last 168 hours. Cardiac Enzymes: No results for input(s): CKTOTAL, CKMB, CKMBINDEX, TROPONINI in the last 168 hours. BNP (last 3 results) No results for input(s): PROBNP in the last 8760 hours. HbA1C: No results for input(s): HGBA1C  in the last 72 hours. CBG: Recent Labs  Lab 07/20/18 1540 07/20/18 2012 07/20/18 2300 07/21/18 0453 07/21/18 0740  GLUCAP 107* 107* 114* 119* 106*   Lipid Profile: No results for input(s): CHOL, HDL, LDLCALC, TRIG, CHOLHDL, LDLDIRECT in the last 72 hours. Thyroid Function Tests: No results for input(s): TSH, T4TOTAL, FREET4, T3FREE, THYROIDAB in the last 72 hours. Anemia Panel: No results for input(s): VITAMINB12, FOLATE, FERRITIN, TIBC, IRON, RETICCTPCT in the last 72 hours. Sepsis Labs: No results for input(s): PROCALCITON, LATICACIDVEN in the last 168 hours.  Recent Results (from the past 240 hour(s))  Culture, Urine     Status: None   Collection Time: 07/15/18  4:27 PM  Result Value Ref Range Status   Specimen Description URINE, CLEAN CATCH  Final   Special Requests NONE  Final   Culture   Final    NO GROWTH Performed at [facility], [address], [place], [place] [address]    Report Status 07/16/2018 FINAL  Final         Radiology Studies: No results found.      Scheduled Meds: . amLODipine  10 mg Oral Daily  . chlorhexidine  15 mL Mouth Rinse BID  . darbepoetin (ARANESP) injection - NON-DIALYSIS  100 mcg Subcutaneous Q Mon-1800  . feeding supplement (NEPRO CARB STEADY)  237 mL Oral BID BM  . ferrous sulfate  325 mg Oral Q breakfast  . heparin injection (subcutaneous)  5,000 Units Subcutaneous Q8H  . pantoprazole  40 mg Oral Q1200  . tamsulosin  0.4 mg Oral Daily   Continuous Infusions: . dextrose 5 % and 0.45% NaCl 75 mL/hr at 07/22/18 1345     LOS: 22 days    Time spent: over 25 min    Barnetta Chapel, MD [facility] Pager [phone].  If 7PM-7AM, please contact night-coverage [email or web address] Password [facility] 07/22/2018, 1:59 PM

## 2018-07-22 NOTE — Progress Notes (Signed)
Pt profusling sweating, pt alert to self (name and date of birth) but not to place. [place] Family at bedside states that is baseline. Vitals documented. Pt assisted to ground by nurse, pt states it went dark. Paged MD, awaiting call back

## 2018-07-22 NOTE — Progress Notes (Signed)
   07/22/18 1900  What Happened  Was fall witnessed? Yes  Who witnessed fall? londra RN  Patients activity before fall ambulating-unassisted  Point of contact other (comment) (assisted to floor)  Was patient injured? No  Follow Up  MD notified charles b. NP  Time MD notified [phone]  Family notified Yes-comment  Time family notified 1900  Additional tests No  Simple treatment Other (comment) (asssited back to bed)  Progress note created (see row info) Yes  Adult Fall Risk Assessment  Risk Factor Category (scoring not indicated) History of more than one fall within 6 months before admission (document High fall risk)  Age 32  Fall History: Fall within 6 months prior to admission 5  Elimination; Bowel and/or Urine Incontinence 2  Elimination; Bowel and/or Urine Urgency/Frequency 2  Medications: includes PCA/Opiates, Anti-convulsants, Anti-hypertensives, Diuretics, Hypnotics, Laxatives, Sedatives, and Psychotropics 5  Patient Care Equipment 2  Mobility-Assistance 2  Mobility-Gait 2  Mobility-Sensory Deficit 0  Altered awareness of immediate physical environment 1  Impulsiveness 2  Lack of understanding of one's physical/cognitive limitations 4  Total Score 27  Patient's Fall Risk High Fall Risk (>13 points)  Adult Fall Risk Interventions  Required Bundle Interventions *See Row Information* High fall risk - low, moderate, and high requirements implemented  Additional Interventions Family Supervision;Lap belt while in chair/wheelchair;PT/OT need assessed if change in mobility from baseline;Pharmacy review of medications;Room near nurses station;Use of appropriate toileting equipment (bedpan, BSC, etc.)  Screening for Fall Injury Risk (To be completed on HIGH fall risk patients) - Assessing Need for Low Bed  Risk For Fall Injury- Low Bed Criteria Previous fall this admission  Will Implement Low Bed and Floor Mats Yes  Screening for Fall Injury Risk (To be completed on HIGH fall risk  patients who do not meet crieteria for Low Bed) - Assessing Need for Floor Mats Only  Risk For Fall Injury- Criteria for Floor Mats Confusion/dementia (+CAM, CIWA, TBI, etc.)  Will Implement Floor Mats Yes

## 2018-07-22 NOTE — Progress Notes (Signed)
[facility] ROUNDING NOTE   Subjective:    Wife at bedside  No complaints  Diuresing well at this time   Objective:  Vital signs in last 24 hours:  Temp:  [98.1 F (36.7 C)-98.6 F (37 C)] 98.6 F (37 C) (07/26 1209) Pulse Rate:  [82-97] 89 (07/26 1209) Resp:  [17-18] 18 (07/26 1209) BP: (130-149)/(87-99) 141/99 (07/26 1209) SpO2:  [97 %-100 %] 99 % (07/26 1209) Weight:  [193 lb 9 oz (87.8 kg)] 193 lb 9 oz (87.8 kg) (07/26 0527)  Weight change: 3 lb 9 oz (1.617 kg) Filed Weights   07/20/18 0410 07/21/18 0454 07/22/18 0527  Weight: 194 lb 7.1 oz (88.2 kg) 190 lb (86.2 kg) 193 lb 9 oz (87.8 kg)    Intake/Output: I/O last 3 completed shifts: In: 1766.4 [P.O.:720; I.V.:1046.4] Out: 1900 [Urine:1900]   Intake/Output this shift:  Total I/O In: 2998.8 [P.O.:2480; I.V.:518.8] Out: 1150 [Urine:1150]  Alert and cooperative   CVS- RRR   JVP  Not elevated at this time  RS- CTA  Bilaterally  ABD- BS present soft non-distended EXT- no edema   Basic Metabolic Panel: Recent Labs  Lab 07/18/18 0625 07/19/18 0715 07/20/18 0434 07/21/18 0518 07/22/18 0552  NA 145 146* 147* 142 144  K 5.0 4.6 4.5 4.2 4.1  CL 111 114* 117* 111 112*  CO2 21* 21* 21* 20* 21*  GLUCOSE 100* 100* 117* 114* 105*  BUN 83* 69* 58* 46* 42*  CREATININE 11.92* 8.97* 6.79* 5.07* 3.93*  CALCIUM 8.7* 9.0 9.2 9.1 9.5  MG 2.1 2.2 2.1 1.9 1.9  PHOS 7.1* 6.5* 5.1* 4.6 4.8*    Liver Function Tests: Recent Labs  Lab 07/17/18 0543 07/18/18 0625 07/19/18 0715 07/20/18 0434 07/21/18 0518 07/22/18 0552  AST 17  --   --   --   --   --   ALT 23  --   --   --   --   --   ALKPHOS 52  --   --   --   --   --   BILITOT 0.5  --   --   --   --   --   PROT 5.8*  --   --   --   --   --   ALBUMIN 2.4*  2.4* 2.7* 2.6* 2.6* 2.8* 2.8*   No results for input(s): LIPASE, AMYLASE in the last 168 hours. No results for input(s): AMMONIA in the last 168 hours.  CBC: Recent Labs  Lab 07/17/18 0543  07/17/18 1415 07/18/18 0625 07/19/18 0715 07/20/18 0434 07/22/18 0552  WBC 7.7  --  6.3 6.4 6.4 7.0  HGB 7.5* 8.0* 8.5* 8.6* 8.8* 9.0*  HCT 24.1* 26.0* 28.4* 28.8* 29.3* 29.9*  MCV 71.1*  --  72.1* 72.4* 72.5* 73.3*  PLT 476*  --  477* 492* 490* 472*    Cardiac Enzymes: No results for input(s): CKTOTAL, CKMB, CKMBINDEX, TROPONINI in the last 168 hours.  BNP: Invalid input(s): POCBNP  CBG: Recent Labs  Lab 07/20/18 1540 07/20/18 2012 07/20/18 2300 07/21/18 0453 07/21/18 0740  GLUCAP 107* 107* 114* 119* 106*    Microbiology: Results for orders placed or performed during the hospital encounter of 06/30/18  MRSA PCR Screening     Status: None   Collection Time: 06/30/18  5:42 PM  Result Value Ref Range Status   MRSA by PCR NEGATIVE NEGATIVE Final    Comment:        The GeneXpert MRSA Assay (FDA  approved for NASAL specimens only), is one component of a comprehensive MRSA colonization surveillance program. It is not intended to diagnose MRSA infection nor to guide or monitor treatment for MRSA infections. Performed at [facility], [address], [place], [place] [address]   C difficile quick scan w PCR reflex     Status: None   Collection Time: 06/30/18  8:33 PM  Result Value Ref Range Status   C Diff antigen NEGATIVE NEGATIVE Final   C Diff toxin NEGATIVE NEGATIVE Final   C Diff interpretation No C. difficile detected.  Final  Culture, Urine     Status: None   Collection Time: 07/15/18  4:27 PM  Result Value Ref Range Status   Specimen Description URINE, CLEAN CATCH  Final   Special Requests NONE  Final   Culture   Final    NO GROWTH Performed at [facility], [address], [place], [place] [address]    Report Status 07/16/2018 FINAL  Final    Coagulation Studies: No results for input(s): LABPROT, INR in the last 72 hours.  Urinalysis: No results for input(s): COLORURINE, LABSPEC, PHURINE, GLUCOSEU, HGBUR, BILIRUBINUR, KETONESUR,  PROTEINUR, UROBILINOGEN, NITRITE, LEUKOCYTESUR in the last 72 hours.  Invalid input(s): APPERANCEUR    Imaging: No results found.   Medications:   . dextrose 5 % and 0.45% NaCl 75 mL/hr at 07/21/18 2254   . amLODipine  10 mg Oral Daily  . chlorhexidine  15 mL Mouth Rinse BID  . darbepoetin (ARANESP) injection - NON-DIALYSIS  100 mcg Subcutaneous Q Mon-1800  . feeding supplement (NEPRO CARB STEADY)  237 mL Oral BID BM  . ferrous sulfate  325 mg Oral Q breakfast  . heparin injection (subcutaneous)  5,000 Units Subcutaneous Q8H  . pantoprazole  40 mg Oral Q1200  . tamsulosin  0.4 mg Oral Daily   alteplase, heparin, heparin, heparin, heparin, heparin, heparin, heparin, heparin, hydrALAZINE, lidocaine (PF), lidocaine-prilocaine, lip balm, pentafluoroprop-tetrafluoroeth  Assessment/ Plan:   Non oliguric acute kidney injury  Secondary to cardiac arrest   S/p CRRT and has had HD prn   Last dialysis session 7/17  Appears to have recovery of renal function with a decrease in serum creatinine.Baseline creatinine 1.23  ( 5/17)    Continues to improve and no intervention needed by Nephrology  I think we could sign off today  Please feel free to call if we are needed  It looks like creatinine is decreasing nicely at this point  Avoid nsaids contrast ace inhibitors cox 2 inhibitors and arb . No contrast  Volume status  Now no edema and appears to have been weaned to room air  - no issues with volume overload appears to be stable   Cardiac arrest  - myoview study showed septal and inferior wall inducible ischemia - history of illicit drug use and no plans to pursue cath at this time  Seen by Dr Cristal Deer and will follow up after discharge   EF improved to 55%   Anoxic brain injury   Will need rehabilitation  Appears stable although waxes and wanes   BPH  Seen by urology  No obstruction  Continue on flomax   Anemia  Hemoglobin fairly stable   8.8   started  Darbepoietin ( no transfusions )    Hypernatremia   Resolved with 1/4 NS   Change to 1/2 NS       It looks like we could sign off at this point  I  anticipate recovery  He could follow up with [facility] on discharge   I think a PA clinic appointment would be reasonable    LOS: 22 Marquesha Robideau W @TODAY @12 :16 PM

## 2018-07-23 LAB — RENAL FUNCTION PANEL
ALBUMIN: 2.8 g/dL — AB (ref 3.5–5.0)
ANION GAP: 12 (ref 5–15)
BUN: 40 mg/dL — ABNORMAL HIGH (ref 6–20)
CALCIUM: 9.3 mg/dL (ref 8.9–10.3)
CO2: 20 mmol/L — AB (ref 22–32)
CREATININE: 3.6 mg/dL — AB (ref 0.61–1.24)
Chloride: 111 mmol/L (ref 98–111)
GFR calc Af Amer: 20 mL/min — ABNORMAL LOW (ref >60)
GFR calc non Af Amer: 17 mL/min — ABNORMAL LOW (ref >60)
GLUCOSE: 106 mg/dL — AB (ref 70–99)
PHOSPHORUS: 3.9 mg/dL (ref 2.5–4.6)
Potassium: 4.2 mmol/L (ref 3.5–5.1)
SODIUM: 143 mmol/L (ref 135–145)

## 2018-07-23 LAB — MAGNESIUM: Magnesium: 2.1 mg/dL (ref 1.7–2.4)

## 2018-07-23 NOTE — Plan of Care (Signed)
  Problem: Nutrition: Goal: Adequate nutrition will be maintained Outcome: Completed/Met

## 2018-07-23 NOTE — Progress Notes (Signed)
PROGRESS NOTE    Christopher Dominguez  [name]:[id number] DOB: [birth year] DOA: 06/30/2018 PCP: Patient, No Pcp Per   Brief Narrative:  57 yo with no significant past medical history, originally from [place] admitted to critical care on 7/4 following a cardiac arrest. -Hospitalization complicated by acute kidney injury requiring hemodialysis, chorea post extubation, encephalopathy from anoxic brain injury, cardiomyopathy with EF down to 25% which recovered to 55% range in 3 days on repeat echo. -Transferred from PCCM to hospitalist service 7/16  He's now s/p stress test which was positive by radiology read, but cardiology disagreed with rad interpretation and recommending outpatient follow up.  His kidney function is improving and his temporary dialysis catheter has been removed.  He continues to have waxing/waning confusion.  Will likely be appropriate for SNF placement within a few days.    07/23/2018: Patient seen alongside patient's sister.  Events of last evening noted.  No new complaints.  We will prursue disposition.  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  CXR without active cardiopulm dz.  Follow urinalysis and culture (no growth).  Will obtain blood cx if recurrent temp.  Hold off on abx for now.  Afebrile since, follow. 07/22/2018: No further fever documented.  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  Recommending proceeding with rehab and f/u outpatient with Dr. Cristal Deer. -Possible discharge to rehab facility versus skilled nursing facility in the next few days.    Acute respiratory failure with hypoxemia (HCC) - following cardiac arrest - Extubated 7/10 - Also treated for aspiration pneumonia, completed antibiotics - Continue pulmonary toilet, incentive spirometer, nebs as needed - Currently doing well on room air   Encephalopathy  anoxic brain injury  Post Hypoxic Chroea -seen by neurology, noted to have waxing and waning mental status (this has continued as I've seen him as well) - delirium precautions -Haldol when necessary (recommended not continuing this long term), gabapentin could be tried as an alternative if movements are interfering with rehab and less sedating alternative needed (gabapentin 300 mg BID -Will need long-term rehabilitation and outpatient neurology follow-up (if persistent deficits and abnormal movements after rehab) -SLP following on dysphagia 2 diet - Therapy recommending CIR, but sounds like family may opt for SNF  Acute renal failure -Due to cardiac arrest, shock -Renal following, was getting CRRT - intermittent HD now, has right temporary IJ HD catheter - Holding dialysis with fall in creatinine and improved UOP.  IVF per nephrology (pt developing hypernatremia, IVF increased to 150 - previously on 1/2 NS 125 cc/hr). - 3.4 L UOP yesterday - Dialysis catheter removed 7/23 -Continue to monitor. -Continues to improve.  Nausea  Vomiting: occurred x1 today and a couple of days ago as well.  Having BM, no tenderness on exam.  Will ctm for now.  Will repeat  EKG for QT eval (prolonged initially - now improved) in case he needs antiemetics. 07/20/2018: None reported  Hematuria/enlarged prostate -Seen by urology -Given very large prostate recommended to avoid Foley  unless residuals greater than 500 -continue Flomax   Transient rectal bleeding -Resolved, seen by GI, supportive care for now -Subcutaneous heparin held -> now resumed, continue SCDs for DVT prophylaxis   shock liver -Transaminitis improved -Trend LFTs  Anemia: stable.  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  Feraheme given 7/22 and 2nd dose ordered by renal for 7/25.  Renal giving epo.  Hypertension: increase amlodipine to 10 mg   DVT prophylaxis: heparin Code Status: full  Family Communication: sister at bedside Disposition Plan: likely to d/c to SNF soon with continued improvement in renal function    Consultants:   Renal  Cards  GI  Urology  Procedures:   R IJ HD catheter CRRT followed by IHD  07/04/18 Study Conclusions  - Left ventricle: The cavity size was normal. Wall thickness was   normal. Systolic function was normal. The estimated ejection   fraction was in the range of 55% to 60%. Wall motion was normal;   there were no regional wall motion abnormalities. - Aortic root: The aortic root was mildly dilated. - Right ventricle: The cavity size was mildly dilated.  Impressions:  - Normal LV function; mildly dilated aortic root; mild RVE.  07/01/18 Study Conclusions  - Left ventricle: The cavity size was normal. Wall thickness was   increased in a pattern of mild LVH. Systolic function was   severely reduced. The estimated ejection fraction was in the   range of 25% to 30%. Diffuse hypokinesis. Doppler parameters are   consistent with abnormal left ventricular relaxation (grade 1   diastolic dysfunction). - Aortic root: The aortic root was mildly dilated. - Mitral valve: Calcified annulus. - Right ventricle: The cavity size was mildly dilated. Systolic   function was severely reduced. - Right atrium: The atrium was mildly dilated.  Impressions:  - Technically difficult; definity used; severe LV dysfunction; mild   LVH; mild  diastolic dysfunction; enlarged right side with   moderate to severe RV dysfunction.  7/5 Final Interpretation: Right: There is no evidence of deep vein thrombosis in the lower extremity. No cystic structure found in the popliteal fossa. Left: There is no evidence of deep vein thrombosis in the lower extremity. However, portions of this examination were limited- see technologist comments above. No cystic structure found in the popliteal fossa.  Antimicrobials:  Anti-infectives (From admission, onward)   Start     Dose/Rate Route Frequency Ordered Stop   07/06/18 1200  piperacillin-tazobactam (ZOSYN) IVPB 3.375 g    Note to Pharmacy:  Please stop after today   3.375 g 100 mL/hr over 30 Minutes Intravenous Every 6 hours 07/06/18 1015 07/06/18 1932   07/02/18 1800  piperacillin-tazobactam (ZOSYN) IVPB 3.375 g  Status:  Discontinued     3.375 g 12.5 mL/hr over 240 Minutes Intravenous Every 6 hours 07/02/18 1309 07/02/18 1310   07/02/18 1800  piperacillin-tazobactam (ZOSYN) IVPB 3.375 g  Status:  Discontinued     3.375 g 100 mL/hr over 30 Minutes Intravenous Every 6 hours 07/02/18 1311 07/06/18 1015   07/02/18 1200  piperacillin-tazobactam (ZOSYN) IVPB 2.25 g  Status:  Discontinued     2.25 g 100 mL/hr over 30 Minutes Intravenous Every 6 hours 07/02/18 0728 07/02/18 1309   07/01/18 0100  piperacillin-tazobactam (ZOSYN) IVPB 3.375 g  Status:  Discontinued     3.375 g 12.5 mL/hr over 240 Minutes Intravenous Every 8 hours 06/30/18 1812 07/02/18 0728   06/30/18 1815  piperacillin-tazobactam (ZOSYN) IVPB 3.375 g     3.375 g 100 mL/hr over 30 Minutes Intravenous  Once 06/30/18 1804 06/30/18 1945      Subjective: No new complaints Awaiting disposition.   Objective: Vitals:   07/22/18 1843 07/22/18 2057 07/23/18 0444 07/23/18 1141  BP: 112/84 (!) 145/95 (!) 133/101 126/87  Pulse: (!) 102 93 95 92  Resp: 18 18 18 20   Temp: 98.6 F (37 C) 99 F (37.2 C) 98.6 F (37 C) 98.8 F (37.1  C)  TempSrc: Oral Oral Oral Oral  SpO2: 99% 99% 99% 97%  Weight:   84.8 kg (187 lb)   Height:        Intake/Output Summary (Last 24 hours) at 07/23/2018 1541 Last data filed at 07/23/2018 1515 Gross per 24 hour  Intake 2811.02 ml  Output 2950 ml  Net -138.98 ml   Filed Weights   07/21/18 0454 07/22/18 0527 07/23/18 0444  Weight: 86.2 kg (190 lb) 87.8 kg (193 lb 9 oz) 84.8 kg (187 lb)    Examination:  General: No acute distress. Cardiovascular: S1-S2 Lungs: Clear to auscultation bilaterally with good air movement.  Abdomen: Soft, nontender, nondistended Neurological: Awake and alert.  Patient moves all limbs.  Encephalopathy persists.   Extremities: No leg edema.    Data Reviewed: I have personally reviewed following labs and imaging studies  CBC: Recent Labs  Lab 07/17/18 0543 07/17/18 1415 07/18/18 0625 07/19/18 0715 07/20/18 0434 07/22/18 0552  WBC 7.7  --  6.3 6.4 6.4 7.0  HGB 7.5* 8.0* 8.5* 8.6* 8.8* 9.0*  HCT 24.1* 26.0* 28.4* 28.8* 29.3* 29.9*  MCV 71.1*  --  72.1* 72.4* 72.5* 73.3*  PLT 476*  --  477* 492* 490* 472*   Basic Metabolic Panel: Recent Labs  Lab 07/19/18 0715 07/20/18 0434 07/21/18 0518 07/22/18 0552 07/23/18 0519  NA 146* 147* 142 144 143  K 4.6 4.5 4.2 4.1 4.2  CL 114* 117* 111 112* 111  CO2 21* 21* 20* 21* 20*  GLUCOSE 100* 117* 114* 105* 106*  BUN 69* 58* 46* 42* 40*  CREATININE 8.97* 6.79* 5.07* 3.93* 3.60*  CALCIUM 9.0 9.2 9.1 9.5 9.3  MG 2.2 2.1 1.9 1.9 2.1  PHOS 6.5* 5.1* 4.6 4.8* 3.9   GFR: Estimated Creatinine Clearance: 23.8 mL/min (A) (by C-G formula based on SCr of 3.6 mg/dL (H)). Liver Function Tests: Recent Labs  Lab 07/17/18 0543  07/19/18 0715 07/20/18 0434 07/21/18 0518 07/22/18 0552 07/23/18 0519  AST 17  --   --   --   --   --   --   ALT 23  --   --   --   --   --   --   ALKPHOS 52  --   --   --   --   --   --   BILITOT 0.5  --   --   --   --   --   --   PROT 5.8*  --   --   --   --   --   --     ALBUMIN 2.4*  2.4*   < > 2.6* 2.6* 2.8* 2.8* 2.8*   < > = values in this interval not displayed.   No results for input(s): LIPASE, AMYLASE in the last 168 hours. No results for input(s): AMMONIA  in the last 168 hours. Coagulation Profile: No results for input(s): INR, PROTIME in the last 168 hours. Cardiac Enzymes: No results for input(s): CKTOTAL, CKMB, CKMBINDEX, TROPONINI in the last 168 hours. BNP (last 3 results) No results for input(s): PROBNP in the last 8760 hours. HbA1C: No results for input(s): HGBA1C in the last 72 hours. CBG: Recent Labs  Lab 07/20/18 1540 07/20/18 2012 07/20/18 2300 07/21/18 0453 07/21/18 0740  GLUCAP 107* 107* 114* 119* 106*   Lipid Profile: No results for input(s): CHOL, HDL, LDLCALC, TRIG, CHOLHDL, LDLDIRECT in the last 72 hours. Thyroid Function Tests: No results for input(s): TSH, T4TOTAL, FREET4, T3FREE, THYROIDAB in the last 72 hours. Anemia Panel: No results for input(s): VITAMINB12, FOLATE, FERRITIN, TIBC, IRON, RETICCTPCT in the last 72 hours. Sepsis Labs: No results for input(s): PROCALCITON, LATICACIDVEN in the last 168 hours.  Recent Results (from the past 240 hour(s))  Culture, Urine     Status: None   Collection Time: 07/15/18  4:27 PM  Result Value Ref Range Status   Specimen Description URINE, CLEAN CATCH  Final   Special Requests NONE  Final   Culture   Final    NO GROWTH Performed at [facility], [address] [place], [place] [address]    Report Status 07/16/2018 FINAL  Final         Radiology Studies: No results found.      Scheduled Meds: . amLODipine  10 mg Oral Daily  . chlorhexidine  15 mL Mouth Rinse BID  . darbepoetin (ARANESP) injection - NON-DIALYSIS  100 mcg Subcutaneous Q Mon-1800  . feeding supplement (NEPRO CARB STEADY)  237 mL Oral BID BM  . ferrous sulfate  325 mg Oral Q breakfast  . heparin injection (subcutaneous)  5,000 Units Subcutaneous Q8H  . pantoprazole  40 mg Oral  Q1200  . tamsulosin  0.4 mg Oral Daily   Continuous Infusions: . dextrose 5 % and 0.45% NaCl 75 mL/hr at 07/23/18 0743     LOS: 23 days    Time spent: over 25 min    Barnetta ChapelSylvester I Waller Marcussen, MD [facility] Pager [phone].  If 7PM-7AM, please contact night-coverage [email or web address] Password [email or web address] 07/23/2018, 3:41 PM

## 2018-07-23 NOTE — Progress Notes (Signed)
In to see pt for treatment with hopes of son and niece being here from [place]. [name] sister in room and she reports they did not come down yet due to no facility in [place] set for pt to D/C to. She, niece, and son are to drive pt to [place] once he has D/C destination. I did recommend to sister that the child lock be put on for pt to ride in back seat--she verbalized understanding. Ignacia PalmaCathy Charmayne Odell, [place]/L [phone] 07/23/2018

## 2018-07-24 LAB — RENAL FUNCTION PANEL
ANION GAP: 9 (ref 5–15)
Albumin: 2.9 g/dL — ABNORMAL LOW (ref 3.5–5.0)
BUN: 35 mg/dL — ABNORMAL HIGH (ref 6–20)
CALCIUM: 9.5 mg/dL (ref 8.9–10.3)
CO2: 21 mmol/L — AB (ref 22–32)
Chloride: 112 mmol/L — ABNORMAL HIGH (ref 98–111)
Creatinine, Ser: 2.94 mg/dL — ABNORMAL HIGH (ref 0.61–1.24)
GFR calc non Af Amer: 22 mL/min — ABNORMAL LOW (ref >60)
GFR, EST AFRICAN AMERICAN: 26 mL/min — AB (ref >60)
Glucose, Bld: 102 mg/dL — ABNORMAL HIGH (ref 70–99)
PHOSPHORUS: 3.6 mg/dL (ref 2.5–4.6)
Potassium: 4.1 mmol/L (ref 3.5–5.1)
SODIUM: 142 mmol/L (ref 135–145)

## 2018-07-24 LAB — MAGNESIUM: Magnesium: 1.8 mg/dL (ref 1.7–2.4)

## 2018-07-24 MED ORDER — LORAZEPAM 2 MG/ML IJ SOLN
1.0000 mg | Freq: Once | INTRAMUSCULAR | Status: AC
  Administered 2018-07-24: 1 mg via INTRAVENOUS
  Filled 2018-07-24: qty 1

## 2018-07-24 NOTE — Progress Notes (Signed)
PROGRESS NOTE    Christopher Dominguez  NFA:[id number] DOB: 12-Jan-1961 DOA: 06/30/2018 PCP: Patient, No Pcp Per   Brief Narrative:  57 yo with no significant past medical history, originally from [place] admitted to critical care on 7/4 following a cardiac arrest. -Hospitalization complicated by acute kidney injury requiring hemodialysis (AKI is resolving, and has resolved significantly), chorea post extubation, encephalopathy from anoxic brain injury, cardiomyopathy with EF down to 25% which recovered to 55% range in 3 days on repeat echo. -Transferred from PCCM to hospitalist service 7/16  He's now s/p stress test which was positive by radiology read, but cardiology disagreed with rad interpretation and recommending outpatient follow up.  His kidney function has improved significantly and temporary dialysis catheter has been removed.  Patient is not awaiting disposition.  The family prefers patient to be discharged to a facility up [place].  Apparently, the patient's sister is from [place], and the patient has a child that lives around [place].  Will likely be appropriate for SNF placement.  07/23/2018: Patient seen alongside patient's sister.  Events of last evening noted.  No new complaints.  We will prursue disposition.  07/24/2018: Pursue disposition.  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  CXR without active cardiopulm dz.  Follow urinalysis and culture (no growth).  Will obtain blood cx if recurrent temp.  Hold off on abx for now.  Afebrile since, follow. 07/22/2018: No further fever documented. 07/24/2018: Fever has resolved  s/p Cardiac arrest (HCC) - presumed to be due to toxic  cardiomyopathy due to cocaine - EF was 25-30% on admission which improved to 55-60% without evidence of any current ongoing motion abnormality -Significantly elevated troponin on admission felt to be due to CPR and cardiac arrest and not ACS -Seen by cardiology this admission - Stress test read positive by radiology, but cardiology disagrees with rad interpretation.  Recommending proceeding with rehab and f/u outpatient with Dr. Cristal Deer. -Possible discharge to rehab facility versus skilled nursing facility in the next few days. 07/24/2018: Pursue disposition.    Acute respiratory failure with hypoxemia (HCC) - following cardiac arrest - Extubated 7/10 - Also treated for aspiration pneumonia, completed antibiotics - Continue pulmonary toilet, incentive spirometer, nebs as needed - Currently doing well on room air -07/24/2018: Resolved   Encephalopathy  anoxic brain injury  Post Hypoxic Chroea -seen by neurology, noted to have waxing and waning mental status (this has continued as I've seen him as well) - delirium precautions -Haldol when necessary (recommended not continuing this long term), gabapentin could be tried as an alternative if movements are interfering with rehab and less sedating alternative needed (gabapentin 300 mg BID -Will need long-term rehabilitation and outpatient neurology follow-up (if persistent deficits and abnormal movements after rehab) -SLP following on dysphagia 2 diet - Therapy recommending CIR, but sounds like family may opt for SNF  Acute renal failure -Due to cardiac arrest, shock -Renal following, was getting CRRT - intermittent HD now, has right temporary IJ HD catheter - Holding dialysis with fall in creatinine and improved UOP.  IVF per nephrology (pt developing hypernatremia, IVF increased to 150 - previously on 1/2 NS 125 cc/hr). -  3.4 L UOP yesterday - Dialysis catheter removed 7/23 -Continue to monitor. -Continues to improve.  Nausea   Vomiting: occurred x1 today and a couple of days ago as well.  Having BM, no tenderness on exam.  Will ctm for now.  Will repeat EKG for QT eval (prolonged initially - now improved) in case he needs antiemetics. 07/20/2018: None reported  Hematuria/enlarged prostate -Seen by urology -Given very large prostate recommended to avoid Foley unless residuals greater than 500 -continue Flomax   Transient rectal bleeding -Resolved, seen by GI, supportive care for now -Subcutaneous heparin held -> now resumed, continue SCDs for DVT prophylaxis -07/24/2018: None reported.   shock liver -Transaminitis improved -Trend LFTs -07/24/2018: Stable  Anemia: stable.  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  Feraheme given 7/22 and 2nd dose ordered by renal for 7/25.  Renal giving epo.  Hypertension: increase amlodipine to 10 mg   DVT prophylaxis: heparin Code Status: full  Family Communication: sister at bedside Disposition Plan: likely to d/c to SNF soon with continued improvement in renal function    Consultants:   Renal  Cards  GI  Urology  Procedures:   R IJ HD catheter CRRT followed by IHD  07/04/18 Study Conclusions  - Left ventricle: The cavity size was normal. Wall thickness was   normal. Systolic function was normal. The estimated ejection   fraction was in the range of 55% to 60%. Wall motion was normal;   there were no regional wall motion abnormalities. - Aortic root: The aortic root was mildly dilated. - Right ventricle: The cavity size was mildly dilated.  Impressions:  - Normal LV function; mildly dilated aortic root; mild RVE.  07/01/18 Study Conclusions  - Left ventricle: The cavity size was normal. Wall thickness was   increased in a pattern of mild LVH. Systolic function was   severely reduced. The estimated ejection fraction was in the   range of 25% to 30%. Diffuse hypokinesis. Doppler parameters are   consistent with abnormal left  ventricular relaxation (grade 1   diastolic dysfunction). - Aortic root: The aortic root was mildly dilated. - Mitral valve: Calcified annulus. - Right ventricle: The cavity size was mildly dilated. Systolic   function was severely reduced. - Right atrium: The atrium was mildly dilated.  Impressions:  - Technically difficult; definity used; severe LV dysfunction; mild   LVH; mild diastolic dysfunction; enlarged right side with   moderate to severe RV dysfunction.  7/5 Final Interpretation: Right: There is no evidence of deep vein thrombosis in the lower extremity. No cystic structure found in the popliteal fossa. Left: There is no evidence of deep vein thrombosis in the lower extremity. However, portions of this examination were limited- see technologist comments above. No cystic structure found in the popliteal fossa.  Antimicrobials:  Anti-infectives (From admission, onward)   Start     Dose/Rate Route Frequency Ordered Stop   07/06/18 1200  piperacillin-tazobactam (ZOSYN) IVPB 3.375 g    Note to Pharmacy:  Please stop after today   3.375 g 100 mL/hr over 30 Minutes Intravenous Every 6 hours 07/06/18 1015 07/06/18 1932   07/02/18 1800  piperacillin-tazobactam (ZOSYN) IVPB 3.375 g  Status:  Discontinued     3.375 g 12.5 mL/hr over 240 Minutes Intravenous Every 6 hours 07/02/18 1309 07/02/18 1310   07/02/18 1800  piperacillin-tazobactam (ZOSYN) IVPB 3.375 g  Status:  Discontinued     3.375 g 100 mL/hr over 30 Minutes Intravenous Every 6 hours  07/02/18 1311 07/06/18 1015   07/02/18 1200  piperacillin-tazobactam (ZOSYN) IVPB 2.25 g  Status:  Discontinued     2.25 g 100 mL/hr over 30 Minutes Intravenous Every 6 hours 07/02/18 0728 07/02/18 1309   07/01/18 0100  piperacillin-tazobactam (ZOSYN) IVPB 3.375 g  Status:  Discontinued     3.375 g 12.5 mL/hr over 240 Minutes Intravenous Every 8 hours 06/30/18 1812 07/02/18 0728   06/30/18 1815  piperacillin-tazobactam (ZOSYN) IVPB 3.375  g     3.375 g 100 mL/hr over 30 Minutes Intravenous  Once 06/30/18 1804 06/30/18 1945      Subjective: No new complaints Awaiting disposition.   Objective: Vitals:   07/24/18 0129 07/24/18 0651 07/24/18 0941 07/24/18 1155  BP: (!) 159/87 (!) 138/97  128/84  Pulse: 88 87  98  Resp:  18  20  Temp:  98.4 F (36.9 C)  98.4 F (36.9 C)  TempSrc:  Oral  Oral  SpO2: 100% 100%  98%  Weight:   87.5 kg (192 lb 14.4 oz)   Height:        Intake/Output Summary (Last 24 hours) at 07/24/2018 1406 Last data filed at 07/24/2018 1325 Gross per 24 hour  Intake 2706.76 ml  Output 1325 ml  Net 1381.76 ml   Filed Weights   07/22/18 0527 07/23/18 0444 07/24/18 0941  Weight: 87.8 kg (193 lb 9 oz) 84.8 kg (187 lb) 87.5 kg (192 lb 14.4 oz)    Examination:  General: No acute distress. Cardiovascular: S1-S2 Lungs: Clear to auscultation bilaterally with good air movement.  Abdomen: Soft, nontender, nondistended Neurological: Awake and alert.  Patient moves all limbs.  Encephalopathy persists.   Extremities: No leg edema.    Data Reviewed: I have personally reviewed following labs and imaging studies  CBC: Recent Labs  Lab 07/17/18 1415 07/18/18 0625 07/19/18 0715 07/20/18 0434 07/22/18 0552  WBC  --  6.3 6.4 6.4 7.0  HGB 8.0* 8.5* 8.6* 8.8* 9.0*  HCT 26.0* 28.4* 28.8* 29.3* 29.9*  MCV  --  72.1* 72.4* 72.5* 73.3*  PLT  --  477* 492* 490* 472*   Basic Metabolic Panel: Recent Labs  Lab 07/20/18 0434 07/21/18 0518 07/22/18 0552 07/23/18 0519 07/24/18 0710  NA 147* 142 144 143 142  K 4.5 4.2 4.1 4.2 4.1  CL 117* 111 112* 111 112*  CO2 21* 20* 21* 20* 21*  GLUCOSE 117* 114* 105* 106* 102*  BUN 58* 46* 42* 40* 35*  CREATININE 6.79* 5.07* 3.93* 3.60* 2.94*  CALCIUM 9.2 9.1 9.5 9.3 9.5  MG 2.1 1.9 1.9 2.1 1.8  PHOS 5.1* 4.6 4.8* 3.9 3.6   GFR: Estimated Creatinine Clearance: 29.2 mL/min (A) (by C-G formula based on SCr of 2.94 mg/dL (H)). Liver Function Tests: Recent Labs   Lab 07/20/18 0434 07/21/18 0518 07/22/18 0552 07/23/18 0519 07/24/18 0710  ALBUMIN 2.6* 2.8* 2.8* 2.8* 2.9*   No results for input(s): LIPASE, AMYLASE in the last 168 hours. No results for input(s): AMMONIA in the last 168 hours. Coagulation Profile: No results for input(s): INR, PROTIME in the last 168 hours. Cardiac Enzymes: No results for input(s): CKTOTAL, CKMB, CKMBINDEX, TROPONINI in the last 168 hours. BNP (last 3 results) No results for input(s): PROBNP in the last 8760 hours. HbA1C: No results for input(s): HGBA1C in the last 72 hours. CBG: Recent Labs  Lab 07/20/18 1540 07/20/18 2012 07/20/18 2300 07/21/18 0453 07/21/18 0740  GLUCAP 107* 107* 114* 119* 106*   Lipid Profile: No results for  input(s): CHOL, HDL, LDLCALC, TRIG, CHOLHDL, LDLDIRECT in the last 72 hours. Thyroid Function Tests: No results for input(s): TSH, T4TOTAL, FREET4, T3FREE, THYROIDAB in the last 72 hours. Anemia Panel: No results for input(s): VITAMINB12, FOLATE, FERRITIN, TIBC, IRON, RETICCTPCT in the last 72 hours. Sepsis Labs: No results for input(s): PROCALCITON, LATICACIDVEN in the last 168 hours.  Recent Results (from the past 240 hour(s))  Culture, Urine     Status: None   Collection Time: 07/15/18  4:27 PM  Result Value Ref Range Status   Specimen Description URINE, CLEAN CATCH  Final   Special Requests NONE  Final   Culture   Final    NO GROWTH Performed at [facility], [address] [place], [place] [address]    Report Status 07/16/2018 FINAL  Final         Radiology Studies: No results found.      Scheduled Meds: . amLODipine  10 mg Oral Daily  . chlorhexidine  15 mL Mouth Rinse BID  . darbepoetin (ARANESP) injection - NON-DIALYSIS  100 mcg Subcutaneous Q Mon-1800  . feeding supplement (NEPRO CARB STEADY)  237 mL Oral BID BM  . ferrous sulfate  325 mg Oral Q breakfast  . heparin injection (subcutaneous)  5,000 Units Subcutaneous Q8H  . pantoprazole   40 mg Oral Q1200  . tamsulosin  0.4 mg Oral Daily   Continuous Infusions: . dextrose 5 % and 0.45% NaCl 75 mL/hr at 07/24/18 0515     LOS: 24 days    Time spent: over 25 min    Barnetta ChapelSylvester I Cheyrl Buley, MD [facility] Pager [phone].  If 7PM-7AM, please contact night-coverage [email or web address] Password [email or web address] 07/24/2018, 2:06 PM

## 2018-07-24 NOTE — Progress Notes (Signed)
Pt has pulled off condom cath again and sister at bedside " he just pulled it off and peed in the bed. I asked him if he needed to pee and he said no then peed in the bed. He just lied to me" I reminded her of his encephalopathy and he will not be able to retain or remember and he is not doing intentionally/ "well I cant stay in here all night and deal with this. He needs to have his arms tied up" I educated that the restraints were only for cases of pulling lines ie HD cath etc and he is refusing the mittens. I advised he could talk the night shift nurse about options but we would have somebody sit with him if she needed to take a break and avoid burnout. She says she might do that and I advised I would notiiy his nurse Kathlene NovemberMike and they could discuss and she verbalized understaniding. I spoke to The Procter & GambleMike RN about situation and he will talk with sister

## 2018-07-25 DIAGNOSIS — R5381 Other malaise: Secondary | ICD-10-CM

## 2018-07-25 LAB — BASIC METABOLIC PANEL
ANION GAP: 8 (ref 5–15)
BUN: 28 mg/dL — AB (ref 6–20)
CHLORIDE: 109 mmol/L (ref 98–111)
CO2: 23 mmol/L (ref 22–32)
Calcium: 9.4 mg/dL (ref 8.9–10.3)
Creatinine, Ser: 2.58 mg/dL — ABNORMAL HIGH (ref 0.61–1.24)
GFR calc Af Amer: 30 mL/min — ABNORMAL LOW (ref >60)
GFR calc non Af Amer: 26 mL/min — ABNORMAL LOW (ref >60)
Glucose, Bld: 106 mg/dL — ABNORMAL HIGH (ref 70–99)
POTASSIUM: 4.3 mmol/L (ref 3.5–5.1)
SODIUM: 140 mmol/L (ref 135–145)

## 2018-07-25 LAB — RENAL FUNCTION PANEL
Albumin: 3 g/dL — ABNORMAL LOW (ref 3.5–5.0)
Anion gap: 9 (ref 5–15)
BUN: 29 mg/dL — AB (ref 6–20)
CO2: 23 mmol/L (ref 22–32)
CREATININE: 2.66 mg/dL — AB (ref 0.61–1.24)
Calcium: 9.5 mg/dL (ref 8.9–10.3)
Chloride: 109 mmol/L (ref 98–111)
GFR calc non Af Amer: 25 mL/min — ABNORMAL LOW (ref >60)
GFR, EST AFRICAN AMERICAN: 29 mL/min — AB (ref >60)
Glucose, Bld: 109 mg/dL — ABNORMAL HIGH (ref 70–99)
Phosphorus: 4.1 mg/dL (ref 2.5–4.6)
Potassium: 4.1 mmol/L (ref 3.5–5.1)
Sodium: 141 mmol/L (ref 135–145)

## 2018-07-25 LAB — GLUCOSE, CAPILLARY: Glucose-Capillary: 117 mg/dL — ABNORMAL HIGH (ref 70–99)

## 2018-07-25 LAB — CBC
HCT: 31.8 % — ABNORMAL LOW (ref 39.0–52.0)
Hemoglobin: 9.6 g/dL — ABNORMAL LOW (ref 13.0–17.0)
MCH: 22.7 pg — ABNORMAL LOW (ref 26.0–34.0)
MCHC: 30.2 g/dL (ref 30.0–36.0)
MCV: 75.2 fL — ABNORMAL LOW (ref 78.0–100.0)
Platelets: 345 10*3/uL (ref 150–400)
RBC: 4.23 MIL/uL (ref 4.22–5.81)
RDW: 18.1 % — ABNORMAL HIGH (ref 11.5–15.5)
WBC: 8.6 10*3/uL (ref 4.0–10.5)

## 2018-07-25 LAB — MAGNESIUM: Magnesium: 1.9 mg/dL (ref 1.7–2.4)

## 2018-07-25 NOTE — Progress Notes (Signed)
Physical Therapy Treatment Patient Details Name: Christopher Dominguez MRN: [id number] DOB: 04/18/1961 Today's Date: 07/25/2018    History of Present Illness Pt is a 57 y.o. 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 CT 7/4 showed bilateral occipital edema and R frontoparietal edema, question anoxic brain injury or posterior reversible encephalopathy syndrome; no hemorrhage or midline shift. CT 7/5 showed no acute intracranial abnormality. ETT 7/4-7/10. Acute renal failure; CRRT stopped 7/12. PMH includes depression.    PT Comments    Pt with improved ambulation tolerance and balance this date. Pt able to amb without AD with minA. Pt however continues to demonstrate, disorientation, impaired short term memory, impaired command follow, impaired processing, and decreased insight to deficits. Acute PT to cont to follow.    Follow Up Recommendations  SNF;Supervision/Assistance - 24 hour     Equipment Recommendations  None recommended by PT    Recommendations for Other Services       Precautions / Restrictions Precautions Precautions: Fall Precaution Comments: impulsive Restrictions Weight Bearing Restrictions: No    Mobility  Bed Mobility Overal bed mobility: Needs Assistance Bed Mobility: Supine to Sit     Supine to sit: Min guard     General bed mobility comments: min guard for safety, max directional verbal cues to bring legs off L side of bed  Transfers Overall transfer level: Needs assistance Equipment used: None Transfers: Sit to/from Stand Sit to Stand: Min guard;Min assist         General transfer comment: pt quick and impulsive, min guard for safety, pt unsteady/staggering  Ambulation/Gait Ambulation/Gait assistance: Min assist Gait Distance (Feet): 400 Feet Assistive device: None Gait Pattern/deviations: Staggering left;Staggering right Gait velocity: dec Gait velocity interpretation:  <1.8 ft/sec, indicate of risk for recurrent falls General Gait Details: pt staggering L/R, no overt episode of LOB but amb with wide base of support, minA to steady pt   Stairs             Wheelchair Mobility    Modified Rankin (Stroke Patients Only)       Balance Overall balance assessment: Needs assistance Sitting-balance support: Feet supported;No upper extremity supported Sitting balance-Leahy Scale: Good Sitting balance - Comments: able to don socks on without difficulty or LOB     Standing balance-Leahy Scale: Fair Standing balance comment: pt able to withstand min/mod pertebations during ambulation                            Cognition Arousal/Alertness: Awake/alert Behavior During Therapy: Impulsive Overall Cognitive Status: Impaired/Different from baseline Area of Impairment: Orientation;Memory;Following commands;Safety/judgement;Problem solving;Attention                 Orientation Level: Disoriented to;Place;Time;Situation Current Attention Level: Sustained Memory: Decreased short-term memory Following Commands: Follows one step commands consistently;Follows multi-step commands inconsistently Safety/Judgement: Decreased awareness of safety;Decreased awareness of deficits   Problem Solving: Slow processing;Decreased initiation;Difficulty sequencing;Requires verbal cues;Requires tactile cues General Comments: pt unable to find large caution signs with max verbal cues decribing them. pt unable to follow multi-step commands, pt also with short term memory deficits      Exercises      General Comments General comments (skin integrity, edema, etc.): vss      Pertinent Vitals/Pain Pain Assessment: No/denies pain    Home Living  Prior Function            PT Goals (current goals can now be found in the care plan section) Acute Rehab PT Goals PT Goal Formulation: With family Time For Goal Achievement:  08/08/18 Potential to Achieve Goals: Good Progress towards PT goals: Progressing toward goals    Frequency    Min 3X/week      PT Plan Current plan remains appropriate    Co-evaluation              AM-PAC PT "6 Clicks" Daily Activity  Outcome Measure  Difficulty turning over in bed (including adjusting bedclothes, sheets and blankets)?: A Little Difficulty moving from lying on back to sitting on the side of the bed? : A Little Difficulty sitting down on and standing up from a chair with arms (e.g., wheelchair, bedside commode, etc,.)?: A Little Help needed moving to and from a bed to chair (including a wheelchair)?: A Little Help needed walking in hospital room?: A Little Help needed climbing 3-5 steps with a railing? : A Lot 6 Click Score: 17    End of Session Equipment Utilized During Treatment: Gait belt Activity Tolerance: Patient tolerated treatment well Patient left: in chair;with call bell/phone within reach;with chair alarm set;with restraints reapplied Nurse Communication: Mobility status;Precautions PT Visit Diagnosis: Other abnormalities of gait and mobility (R26.89);Other symptoms and signs involving the nervous system (R29.898);Unsteadiness on feet (R26.81)     Time: [id number] PT Time Calculation (min) (ACUTE ONLY): 25 min  Charges:  $Gait Training: 23-37 mins                     Lewis ShockAshly Jesselyn Rask, PT, DPT Pager #: [phone] Office #: [phone]    Rozell Searingshly M Stephani Janak 07/25/2018, 2:36 PM

## 2018-07-25 NOTE — Progress Notes (Signed)
   07/25/18 1658  Vitals  Temp 98.3 F (36.8 C)  Temp Source Oral  BP 114/85  MAP (mmHg) 95  BP Location Right Arm  BP Method Automatic  Patient Position (if appropriate) Sitting  Pulse Rate (!) 57  Pulse Rate Source Monitor  Resp 20  Oxygen Therapy  SpO2 97 %  O2 Device Room Air  Pain Assessment  Pain Scale 0-10  Pain Score 0

## 2018-07-25 NOTE — Clinical Social Work Note (Addendum)
CSW spoke with Darl PikesSusan at central intake dept for [facility]. She stated they will review the referral today and call CSW back. CSW noted that referral had been sent last week to review. CSW sent updated speech and MD note for review.  Charlynn CourtSarah Sherlyn Ebbert, CSW [phone]  11:40 am [facility] is unable to offer patient a bed due to substance abuse history. CSW tried calling son. No answer and voicemail is not set up. Will try again later today.  Charlynn CourtSarah Brena Windsor, CSW [phone]  12:53 pm CSW spoke with patient's sister. She is requesting MD note stating that she will be patient's caregiver during and after rehab and they will need emergency housing. Patient's sister stated she was living in an apartment in [place] but her daughter did not pay her rent and therefore she lost the apartment. Patient's sister agreeable to moving to [name]. She stated patient's son is not aware of this plan yet. Discussed letter with Chiropodistassistant director of social work. He stated CSW can write up the letter but MD has to sign it. CSW will discuss with son first. Will call this afternoon as that appears to be the easiest time to reach him.  Charlynn CourtSarah Braedon Sjogren, CSW [phone]  2:23 pm CSW notified son that [facility]. [facility] CSW spoke with their admissions coordinator, Gretta BeganSue Wagoner, and faxed referral. Notified patient's son of his aunt's request earlier today. He had planned on long-term care after rehab and would like CSW to hold off on writing the letter she requested.  Charlynn CourtSarah Cayetano Mikita, CSW [phone]

## 2018-07-25 NOTE — [place] FL2 (Signed)
[place] MEDICAID FL2 LEVEL OF CARE SCREENING TOOL     IDENTIFICATION  Patient Name: Christopher Dominguez Birthdate: [birth year] Sex: male Admission Date (Current Location): 06/30/2018  [facility] and [place] Number:  Producer, television/film/video and Address:  [facility], [address] [place], [place] [address]      Provider Number: [id number]  Attending Physician Name and Address:  Delano Metz, MD  Relative Name and Phone Number:       Current Level of Care: Hospital Recommended Level of Care: Skilled Nursing Facility Prior Approval Number:    Date Approved/Denied:   PASRR Number:    Discharge Plan: SNF    Current Diagnoses: Patient Active Problem List   Diagnosis Date Noted  . Debility   . Gross hematuria   . Lower GI bleed   . AKI (acute kidney injury) (HCC)   . 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 Encounter for orogastric (OG) tube placement   . Acute respiratory failure with hypoxemia (HCC)   . Anoxic brain injury (HCC)   . Cardiac arrest (HCC) 06/30/2018    Orientation RESPIRATION BLADDER Height & Weight     Fluctuates. Currently oriented to self only. Normal Continent, External catheter Weight: 191 lb 1.6 oz (86.7 kg) Height:  5\' 11"  (180.3 cm)  BEHAVIORAL SYMPTOMS/MOOD NEUROLOGICAL BOWEL NUTRITION STATUS  Anxious, cooperative. (None) Incontinent Diet(Regular)  AMBULATORY STATUS COMMUNICATION OF NEEDS Skin   Limited Assist Verbally Skin abrasions, Bruising                       Personal Care Assistance Level of Assistance  Bathing, Feeding, Dressing Bathing Assistance: Limited assistance Feeding assistance: Limited assistance Dressing Assistance: Limited assistance     Functional Limitations Info  Sight, Hearing, Speech Sight Info: Adequate Hearing Info: Adequate Speech Info: Adequate    SPECIAL CARE FACTORS FREQUENCY  PT (By licensed PT), Restraints, OT (By licensed OT)     PT Frequency: 5 x week OT Frequency:  5 x week          Contractures  Not present    Additional Factors Info  Code Status, Allergies Code Status Info: Full Allergies Info: NKDA           Current Medications (07/25/2018):  This is the current hospital active medication list Current Facility-Administered Medications  Medication Dose Route Frequency Provider Last Rate Last Dose  . amLODipine (NORVASC) tablet 10 mg  10 mg Oral Daily Zigmund Daniel., MD   10 mg at 07/25/18 1020  . Darbepoetin Alfa (ARANESP) injection 100 mcg  100 mcg Subcutaneous Q Mon-1800 Elvis Coil, MD   100 mcg at 07/18/18 1722  . feeding supplement (NEPRO CARB STEADY) liquid 237 mL  237 mL Oral BID BM Max Fickle B, MD   237 mL at 07/25/18 1023  . ferrous sulfate tablet 325 mg  325 mg Oral Q breakfast Zigmund Daniel., MD   325 mg at 07/25/18 1020  . heparin injection 5,000 Units  5,000 Units Subcutaneous Q8H Lauris Poag, MD   5,000 Units at 07/24/18 [phone]  . lip balm (CARMEX) ointment   Topical PRN Reyes Ivan, MD   1 application at 07/10/18 2253  . tamsulosin (FLOMAX) capsule 0.4 mg  0.4 mg Oral Daily Ollis, Brandi L, NP   0.4 mg at 07/25/18 1019     Discharge Medications: Please see discharge summary for a list of discharge medications.  Relevant  Imaging Results:  Relevant Lab Results:   Additional Information    Margarito LinerSarah C Alajah Witman, LCSW

## 2018-07-25 NOTE — Progress Notes (Signed)
PROGRESS NOTE    Christopher Dominguez  ZOX:[id number] DOB: 05/24/1961 DOA: 06/30/2018 PCP: Patient, No Pcp Per   Brief Narrative:  57 yo with no significant past medical history, originally from [place] admitted to critical care service at [facility] on 7/4 following a cardiac arrest.  Hospitalization complicated by AKI requiring temp HD, now recovering renal function and HD cath was removed. Also complicate by anoxic brain injury w/ encephalopathy and chorea issues. Chorea has now resolved, and memory/ retention/ orientatoin are progressively improving. ECHO showed cardiomyopathy with EF down to 25% which recovered to 55% range in 3 days on repeat echo. Transferred from [facility] to hospitalist service 7/16.  He had a cardiac stress test which was positive by radiology read, but cardiology disagreed with rad interpretation and recommending outpatient follow up. Patient is now awaiting disposition.  The family prefers patient to be discharged to a facility up [place].  Apparently, the patient's sister is from [place], and the patient has a child that lives around [place].  Will likely be appropriate for SNF placement.  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 - EF 25-30% on admission which improved to 55-60% on f/u echo, no wma - ^^ troponin on admit felt to be d/t CPR/ cardiac arrest and not ACS - appreciate cardiology assistance - stress test read as + by radiology, but cardiology disagreed with rad interpretation and recommended proceeding with rehab and f/u outpatient   2) Acute respiratory failure with hypoxemia (HCC) - following cardiac arrest - Extubated 7/10 - completed antibiotics for asp pna - Currently doing well on room air   3)  Encephalopathy  anoxic brain injury  post hypoxic Chroea - seen by neurology - much better, chorea has resolved - acute memory loss is improving, knows year/ [place], not  sure of city - needs rehab - only needs OP neurology follow-up if persistent deficits after rehab - SLP following on dysphagia 2 diet - PT recommending CIR, but family may opt for SNF  4) Acute renal failure - AKI due to cardiac arrest/ shock, pt had CRRT for a period - now is recovering renal function, temp HD cath dc'd on 7/23 - creat down to 2.6 - dc IVF's   5) Hematuria/enlarged prostate - seen by urology - given very large prostate recommended to avoid Foley unless residuals greater than 500 - continue Flomax   6) Transient rectal bleeding -Resolved, seen by GI, supportive care for now -Subcutaneous heparin held -> now resumed, continue SCDs for DVT prophylaxis  7) Shock liver -Transaminitis improved  8) Anemia: stable.  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  Feraheme given 7/22 and 2nd dose ordered by renal for 7/25.  Renal giving epo.  9) Hypertension: amlodipine 10 mg   10) Disposition: rejected by SNF due to substance abuse history, SW still working on placement, possibly in [place] to be closer to family.    Vinson Moselle MD [employer] pager [phone]   07/25/2018, 1:22 PM     DVT prophylaxis: heparin sq Code Status: full  Family Communication: sister at bedside Disposition Plan: likely to d/c to SNF soon  Consultants:   Renal  Cards  GI  Urology  Procedures:  R IJ HD catheter CRRT followed by IHD  07/04/18 Study Conclusions  - Left ventricle: The cavity size was normal. Wall thickness was   normal. Systolic function was  normal. The estimated ejection   fraction was in the range of 55% to 60%. Wall motion was normal;   there were no regional wall motion abnormalities. - Aortic root: The aortic root was mildly dilated. - Right ventricle: The cavity size was mildly dilated.  Impressions:  - Normal LV function; mildly dilated aortic root; mild RVE.  07/01/18 Study Conclusions  - Left ventricle:  The cavity size was normal. Wall thickness was   increased in a pattern of mild LVH. Systolic function was   severely reduced. The estimated ejection fraction was in the   range of 25% to 30%. Diffuse hypokinesis. Doppler parameters are   consistent with abnormal left ventricular relaxation (grade 1   diastolic dysfunction). - Aortic root: The aortic root was mildly dilated. - Mitral valve: Calcified annulus. - Right ventricle: The cavity size was mildly dilated. Systolic   function was severely reduced. - Right atrium: The atrium was mildly dilated.  Impressions:  - Technically difficult; definity used; severe LV dysfunction; mild   LVH; mild diastolic dysfunction; enlarged right side with   moderate to severe RV dysfunction.  7/5 Final Interpretation: Right: There is no evidence of deep vein thrombosis in the lower extremity. No cystic structure found in the popliteal fossa. Left: There is no evidence of deep vein thrombosis in the lower extremity. However, portions of this examination were limited- see technologist comments above. No cystic structure found in the popliteal fossa.  Antimicrobials:  Anti-infectives (From admission, onward)   Start     Dose/Rate Route Frequency Ordered Stop   07/06/18 1200  piperacillin-tazobactam (ZOSYN) IVPB 3.375 g    Note to Pharmacy:  Please stop after today   3.375 g 100 mL/hr over 30 Minutes Intravenous Every 6 hours 07/06/18 1015 07/06/18 1932   07/02/18 1800  piperacillin-tazobactam (ZOSYN) IVPB 3.375 g  Status:  Discontinued     3.375 g 12.5 mL/hr over 240 Minutes Intravenous Every 6 hours 07/02/18 1309 07/02/18 1310   07/02/18 1800  piperacillin-tazobactam (ZOSYN) IVPB 3.375 g  Status:  Discontinued     3.375 g 100 mL/hr over 30 Minutes Intravenous Every 6 hours 07/02/18 1311 07/06/18 1015   07/02/18 1200  piperacillin-tazobactam (ZOSYN) IVPB 2.25 g  Status:  Discontinued     2.25 g 100 mL/hr over 30 Minutes Intravenous Every 6  hours 07/02/18 0728 07/02/18 1309   07/01/18 0100  piperacillin-tazobactam (ZOSYN) IVPB 3.375 g  Status:  Discontinued     3.375 g 12.5 mL/hr over 240 Minutes Intravenous Every 8 hours 06/30/18 1812 07/02/18 0728   06/30/18 1815  piperacillin-tazobactam (ZOSYN) IVPB 3.375 g     3.375 g 100 mL/hr over 30 Minutes Intravenous  Once 06/30/18 1804 06/30/18 1945      Subjective: No new complaints Awaiting disposition.   Objective: Vitals:   07/24/18 2014 07/24/18 2056 07/25/18 0444 07/25/18 1020  BP: (!) 146/89 (!) 135/99 123/77 (!) 131/95  Pulse: 88 88 83 84  Resp: 18 18 18    Temp: 98.2 F (36.8 C) 98.5 F (36.9 C) 98.7 F (37.1 C)   TempSrc: Oral Oral Oral   SpO2: 99% 98% 98%   Weight:   86.7 kg (191 lb 1.6 oz)   Height:        Intake/Output Summary (Last 24 hours) at 07/25/2018 1045 Last data filed at 07/25/2018 0445 Gross per 24 hour  Intake 848.7 ml  Output 1675 ml  Net -826.3 ml   Filed Weights   07/23/18 0444 07/24/18 0941  07/25/18 0444  Weight: 84.8 kg (187 lb) 87.5 kg (192 lb 14.4 oz) 86.7 kg (191 lb 1.6 oz)    Examination:  General: No acute distress. Cardiovascular: S1-S2 Lungs: Clear to auscultation bilaterally with good air movement.  Abdomen: Soft, nontender, nondistended Neurological: Awake and alert.  Patient moves all limbs.  Encephalopathy persists.   Extremities: No leg edema.    Data Reviewed: I have personally reviewed following labs and imaging studies  CBC: Recent Labs  Lab 07/19/18 0715 07/20/18 0434 07/22/18 0552 07/25/18 0629  WBC 6.4 6.4 7.0 8.6  HGB 8.6* 8.8* 9.0* 9.6*  HCT 28.8* 29.3* 29.9* 31.8*  MCV 72.4* 72.5* 73.3* 75.2*  PLT 492* 490* 472* 345   Basic Metabolic Panel: Recent Labs  Lab 07/21/18 0518 07/22/18 0552 07/23/18 0519 07/24/18 0710 07/25/18 0629  NA 142 144 143 142 141  K 4.2 4.1 4.2 4.1 4.1  CL 111 112* 111 112* 109  CO2 20* 21* 20* 21* 23  GLUCOSE 114* 105* 106* 102* 109*  BUN 46* 42* 40* 35* 29*    CREATININE 5.07* 3.93* 3.60* 2.94* 2.66*  CALCIUM 9.1 9.5 9.3 9.5 9.5  MG 1.9 1.9 2.1 1.8 1.9  PHOS 4.6 4.8* 3.9 3.6 4.1   GFR: Estimated Creatinine Clearance: 32.2 mL/min (A) (by C-G formula based on SCr of 2.66 mg/dL (H)). Liver Function Tests: Recent Labs  Lab 07/21/18 0518 07/22/18 0552 07/23/18 0519 07/24/18 0710 07/25/18 0629  ALBUMIN 2.8* 2.8* 2.8* 2.9* 3.0*   No results for input(s): LIPASE, AMYLASE in the last 168 hours. No results for input(s): AMMONIA in the last 168 hours. Coagulation Profile: No results for input(s): INR, PROTIME in the last 168 hours. Cardiac Enzymes: No results for input(s): CKTOTAL, CKMB, CKMBINDEX, TROPONINI in the last 168 hours. BNP (last 3 results) No results for input(s): PROBNP in the last 8760 hours. HbA1C: No results for input(s): HGBA1C in the last 72 hours. CBG: Recent Labs  Lab 07/20/18 2012 07/20/18 2300 07/21/18 0453 07/21/18 0740 07/22/18 1641  GLUCAP 107* 114* 119* 106* 117*   Lipid Profile: No results for input(s): CHOL, HDL, LDLCALC, TRIG, CHOLHDL, LDLDIRECT in the last 72 hours. Thyroid Function Tests: No results for input(s): TSH, T4TOTAL, FREET4, T3FREE, THYROIDAB in the last 72 hours. Anemia Panel: No results for input(s): VITAMINB12, FOLATE, FERRITIN, TIBC, IRON, RETICCTPCT in the last 72 hours. Sepsis Labs: No results for input(s): PROCALCITON, LATICACIDVEN in the last 168 hours.  Recent Results (from the past 240 hour(s))  Culture, Urine     Status: None   Collection Time: 07/15/18  4:27 PM  Result Value Ref Range Status   Specimen Description URINE, CLEAN CATCH  Final   Special Requests NONE  Final   Culture   Final    NO GROWTH Performed at [facility], [address] [place], [place] [address]    Report Status 07/16/2018 FINAL  Final         Radiology Studies: No results found.      Scheduled Meds: . amLODipine  10 mg Oral Daily  . chlorhexidine  15 mL Mouth Rinse BID  .  darbepoetin (ARANESP) injection - NON-DIALYSIS  100 mcg Subcutaneous Q Mon-1800  . feeding supplement (NEPRO CARB STEADY)  237 mL Oral BID BM  . ferrous sulfate  325 mg Oral Q breakfast  . heparin injection (subcutaneous)  5,000 Units Subcutaneous Q8H  . pantoprazole  40 mg Oral Q1200  . tamsulosin  0.4 mg Oral Daily  Continuous Infusions: . dextrose 5 % and 0.45% NaCl 75 mL/hr at 07/25/18 0843     LOS: 25 days    Time spent: over 25 min

## 2018-07-25 NOTE — Progress Notes (Signed)
Per family, patient was assisted to shower by family. Sister called out for help. Staff found patient on shower floor with sister holding him. Sister states she assisted him to the floor. Primary RN states she was not notified that pt was in the shower. Vitals WNL. Patient does not complain of any pain and there was no injury. Sister states pt did not hit head. MD text paged.

## 2018-07-25 NOTE — Progress Notes (Signed)
   07/25/18 1653  What Happened  Was fall witnessed? No (sister assisted pt to floor)  Patients activity before fall shower  Point of contact buttocks  Was patient injured? No  Patient found on floor  Found by Staff-comment (Nursing staff)  Stated prior activity shower  Follow Up  MD notified [place]  Time MD notified [phone]  Family notified Yes-comment (family at bedside)  Time family notified 1650  Adult Fall Risk Assessment  Risk Factor Category (scoring not indicated) Fall has occurred during this admission (document High fall risk)  Patient's Fall Risk High Fall Risk (>13 points)  Adult Fall Risk Interventions  Required Bundle Interventions *See Row Information* High fall risk - low, moderate, and high requirements implemented  Additional Interventions Use of appropriate toileting equipment (bedpan, BSC, etc.)  Screening for Fall Injury Risk (To be completed on HIGH fall risk patients) - Assessing Need for Low Bed  Risk For Fall Injury- Low Bed Criteria Previous fall this admission  Will Implement Low Bed and Floor Mats Yes  Screening for Fall Injury Risk (To be completed on HIGH fall risk patients who do not meet crieteria for Low Bed) - Assessing Need for Floor Mats Only  Risk For Fall Injury- Criteria for Floor Mats Confusion/dementia (+CAM, CIWA, TBI, etc.)  Will Implement Floor Mats Yes  Musculoskeletal  Musculoskeletal (WDL) X  Assistive Device Other (Comment) (gait belt)  Generalized Weakness Yes  Weight Bearing Restrictions No  Integumentary  Integumentary (WDL) WDL

## 2018-07-25 NOTE — Care Management Important Message (Signed)
Important Message  Patient Details  Name: Christopher Dominguez MRN: [id number] Date of Birth: Jun 20, 1961   Medicare Important Message Given:  Yes    [name] [name] 07/25/2018, 2:30 PM

## 2018-07-25 NOTE — Progress Notes (Signed)
Came to patient room to see if patient's sister wanted to [facility].  If so, She will need to come to Spiritual Care office during office hours and Fill out some paperwork.  Patient sister said it would be nice, but she would not [facility] and that she would rather just stay in hospital with her brother in his room.  If she should change her mind, she would need to go to Spiritual Care office on [address] across from [employer] [facility] during office hours and fill out paperwork with the Environmental health practitionerAdministrative Assistant.   Phebe CollaDonna S Evanie Buckle, Chaplain   07/25/18 1600  Clinical Encounter Type  Visited With Patient and family together  Visit Type Follow-up  Referral From Nurse  Consult/Referral To Chaplain  Spiritual Encounters  Spiritual Needs  Jola Babinski([facility] use?)  Stress Factors  Patient Stress Factors Not reviewed  Family Stress Factors Not reviewed

## 2018-07-26 LAB — GLUCOSE, CAPILLARY: Glucose-Capillary: 97 mg/dL (ref 70–99)

## 2018-07-26 MED ORDER — ENSURE ENLIVE PO LIQD
237.0000 mL | Freq: Two times a day (BID) | ORAL | Status: DC
  Administered 2018-07-26 – 2018-08-12 (×34): 237 mL via ORAL

## 2018-07-26 NOTE — Progress Notes (Signed)
Nutrition Follow-up  DOCUMENTATION CODES:   Not applicable  INTERVENTION:   -D/c Nepro Shake po BID, each supplement provides 425 kcal and 19 grams protein -Ensure Enlive po BID, each supplement provides 350 kcal and 20 grams of protein  NUTRITION DIAGNOSIS:   Inadequate oral intake related to lethargy/confusion as evidenced by meal completion < 25%.  Progressing  GOAL:   Patient will meet greater than or equal to 90% of their needs  Met with meals and supplements  MONITOR:   Supplement acceptance, Diet advancement, PO intake, I & O's  REASON FOR ASSESSMENT:   Consult Enteral/tube feeding initiation and management  ASSESSMENT:   Pt with unknown PMH admitted after OOH arrest.   7/18- s/p BSE- advanced to regular diet 7/23- HD cath removed 7/26- renal signed off  Reviewed I/O's: +480 ml x 24 hours and -7.9 L since admission  Pt resting quietly in bed, watching television at time of visit.   Reviewed wt hx; wt has been stable over the past week.   Intake continues to be good. Noted meal completion 50-100%. Pt also taking supplements well. Given renal sign off, catheter removal, and labs WDL, will substitute supplement from Nepro to Ensure.   Per CSW notes, awaiting SNF placement in [place] to be closer to family.   Labs reviewed: K, Mg, and Phos WDL.   Diet Order:   Diet Order           Diet regular Room service appropriate? Yes; Fluid consistency: Thin  Diet effective now          EDUCATION NEEDS:   No education needs have been identified at this time  Skin:  Skin Assessment: Reviewed RN Assessment  Last BM:  07/24/18  Height:   Ht Readings from Last 1 Encounters:  07/05/18 _0  (1.803 m)    Weight:   Wt Readings from Last 1 Encounters:  07/26/18 190 lb 6.4 oz (86.4 kg)    Ideal Body Weight:  78.1 kg  BMI:  Body mass index is 26.56 kg/m.  Estimated Nutritional Needs:   Kcal:  2200-2400  Protein:  105-125 grams  Fluid:  2  L/day    Carren Blakley A. Jimmye Norman, RD, LDN, CDE Pager: [phone] After hours Pager: [phone]

## 2018-07-26 NOTE — Progress Notes (Signed)
PROGRESS NOTE    Georga KaufmannShelton M Waight  ZOX:[id number] DOB: 06-08-1961 DOA: 06/30/2018 PCP: Patient, No Pcp Per   Brief Narrative:  57 yo with no significant past medical history, originally from [place] admitted [facility] critical [facility] on 7/4 following a cardiac arrest.  Hospitalization complicated by AKI requiring temp HD, now recovering renal function and HD cath was removed. Also complicate by anoxic brain injury w/ encephalopathy and chorea issues. Chorea has now resolved, and memory/ retention/ orientatoin are progressively improving. ECHO showed cardiomyopathy with EF down to 25% which recovered to 55% range in 3 days on repeat echo. [facility]ist service [facility]/16.  He had a cardiac stress test which was positive by radiology read, but cardiology disagreed with rad interpretation and recommending outpatient follow up. Patient is now awaiting disposition.  The family prefers patient to be discharged to a facility [place].  Apparently, the patient's sister is from [place], and the patient has a child that lives around [place].  Will likely be appropriate for SNF placement.    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 - EF 25-30% on admission which improved to 55-60% on f/u echo, no wma - ^^ troponin on admit felt to be d/t CPR/ cardiac arrest and not ACS per cards - stress test read as + by radiology but cardiology disagreed with rad interpretation and recommended proceeding with rehab and f/u outpatient  2) Encephalopathy  anoxic brain injury  post hypoxic Chroea - seen by neurology - chorea has resolved - memory still poor, oriented to person only today - only needs OP neurology follow-up if persistent deficits after rehab per neuro notes - SLP following on dysphagia 2 diet  3) Debility-  - improved, walking 400 ft now   4) Acute respiratory failure with hypoxemia:  extubated 7/10, sp course IV abx for asp pna  5 ) Acute renal failure - AKI due to cardiac arrest/ shock, pt had CRRT for a period, temp HD cath removed 7/23 - creat down to mid 2's, may be leveling off. Last creat here 1.23 in 2017 - dc'd IVF's   5) Hematuria/enlarged prostate - seen by urology - given very large prostate recommended to avoid Foley unless residuals greater than 500 - continue Flomax   6) Transient rectal bleeding -Resolved, seen by GI, supportive care for now -Subcutaneous heparin held -> now resumed  7) Shock liver -Transaminitis improved  8) Anemia: stable.  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  Feraheme given 7/22 and 2nd dose ordered by renal for 7/25.  Renal giving epo.  9) Hypertension: amlodipine 10 mg   10) Disposition: per SW , SNF/ rehab placement will be unlikely w/ patient walking 400 ft now; some possible options at this point per SW notes today would be home w/ family vs placement at [name] [place]. They will d/w family today.      Vinson Moselleob Breyon Blass MD [facility] pager [phone]   07/26/2018, 1:24 PM     DVT prophylaxis: heparin sq Code Status: full  Family Communication: on fam here today Disposition Plan: home w/ family vs ALF in [place]  Consultants:   Renal  Cards  GI  Urology  Procedures:  R IJ HD catheter CRRT followed by IHD  07/04/18 Study Conclusions  - Left ventricle: The cavity size was normal. Wall thickness was  normal. Systolic function was normal. The estimated ejection   fraction was in the range of 55% to 60%. Wall motion was normal;   there were no regional wall motion abnormalities. - Aortic root: The aortic root was mildly dilated. - Right ventricle: The cavity size was mildly dilated.  Impressions:  - Normal LV function; mildly dilated aortic root; mild RVE.  07/01/18 Study Conclusions  - Left ventricle: The cavity size was normal. Wall thickness  was   increased in a pattern of mild LVH. Systolic function was   severely reduced. The estimated ejection fraction was in the   range of 25% to 30%. Diffuse hypokinesis. Doppler parameters are   consistent with abnormal left ventricular relaxation (grade 1   diastolic dysfunction). - Aortic root: The aortic root was mildly dilated. - Mitral valve: Calcified annulus. - Right ventricle: The cavity size was mildly dilated. Systolic   function was severely reduced. - Right atrium: The atrium was mildly dilated.  Impressions:  - Technically difficult; definity used; severe LV dysfunction; mild   LVH; mild diastolic dysfunction; enlarged right side with   moderate to severe RV dysfunction.  7/5 Final Interpretation: Right: There is no evidence of deep vein thrombosis in the lower extremity. No cystic structure found in the popliteal fossa. Left: There is no evidence of deep vein thrombosis in the lower extremity. However, portions of this examination were limited- see technologist comments above. No cystic structure found in the popliteal fossa.  Antimicrobials:  Anti-infectives (From admission, onward)   Start     Dose/Rate Route Frequency Ordered Stop   07/06/18 1200  piperacillin-tazobactam (ZOSYN) IVPB 3.375 g    Note to Pharmacy:  Please stop after today   3.375 g 100 mL/hr over 30 Minutes Intravenous Every 6 hours 07/06/18 1015 07/06/18 1932   07/02/18 1800  piperacillin-tazobactam (ZOSYN) IVPB 3.375 g  Status:  Discontinued     3.375 g 12.5 mL/hr over 240 Minutes Intravenous Every 6 hours 07/02/18 1309 07/02/18 1310   07/02/18 1800  piperacillin-tazobactam (ZOSYN) IVPB 3.375 g  Status:  Discontinued     3.375 g 100 mL/hr over 30 Minutes Intravenous Every 6 hours 07/02/18 1311 07/06/18 1015   07/02/18 1200  piperacillin-tazobactam (ZOSYN) IVPB 2.25 g  Status:  Discontinued     2.25 g 100 mL/hr over 30 Minutes Intravenous Every 6 hours 07/02/18 0728 07/02/18 1309   07/01/18  0100  piperacillin-tazobactam (ZOSYN) IVPB 3.375 g  Status:  Discontinued     3.375 g 12.5 mL/hr over 240 Minutes Intravenous Every 8 hours 06/30/18 1812 07/02/18 0728   06/30/18 1815  piperacillin-tazobactam (ZOSYN) IVPB 3.375 g     3.375 g 100 mL/hr over 30 Minutes Intravenous  Once 06/30/18 1804 06/30/18 1945      Subjective: No new complaints. Remains fully disoriented but calm and responsive.  Awaiting disposition.   Objective: Vitals:   07/25/18 1946 07/26/18 0537 07/26/18 0948 07/26/18 1116  BP: 135/90 126/84 119/77 134/89  Pulse: 88 91  95  Resp: 18 18  16   Temp: 98.8 F (37.1 C) 98.6 F (37 C)  98.1 F (36.7 C)  TempSrc: Oral Oral  Oral  SpO2: 100% 98%  100%  Weight:  86.4 kg (190 lb 6.4 oz)    Height:        Intake/Output Summary (Last 24 hours) at 07/26/2018 1324 Last data filed at 07/26/2018 0000 Gross per 24 hour  Intake 480 ml  Output -  Net 480 ml  Filed Weights   07/24/18 0941 07/25/18 0444 07/26/18 0537  Weight: 87.5 kg (192 lb 14.4 oz) 86.7 kg (191 lb 1.6 oz) 86.4 kg (190 lb 6.4 oz)    Examination:  General: No acute distress. Cardiovascular: S1-S2 Lungs: Clear to auscultation bilaterally with good air movement.  Abdomen: Soft, nontender, nondistended Neurological: Awake and alert.  Patient moves all limbs.  Encephalopathy persists.   Extremities: No leg edema.    Data Reviewed: I have personally reviewed following labs and imaging studies  CBC: Recent Labs  Lab 07/20/18 0434 07/22/18 0552 07/25/18 0629  WBC 6.4 7.0 8.6  HGB 8.8* 9.0* 9.6*  HCT 29.3* 29.9* 31.8*  MCV 72.5* 73.3* 75.2*  PLT 490* 472* 345   Basic Metabolic Panel: Recent Labs  Lab 07/21/18 0518 07/22/18 0552 07/23/18 0519 07/24/18 0710 07/25/18 0629 07/25/18 1219  NA 142 144 143 142 141 140  K 4.2 4.1 4.2 4.1 4.1 4.3  CL 111 112* 111 112* 109 109  CO2 20* 21* 20* 21* 23 23  GLUCOSE 114* 105* 106* 102* 109* 106*  BUN 46* 42* 40* 35* 29* 28*  CREATININE 5.07*  3.93* 3.60* 2.94* 2.66* 2.58*  CALCIUM 9.1 9.5 9.3 9.5 9.5 9.4  MG 1.9 1.9 2.1 1.8 1.9  --   PHOS 4.6 4.8* 3.9 3.6 4.1  --    GFR: Estimated Creatinine Clearance: 33.2 mL/min (A) (by C-G formula based on SCr of 2.58 mg/dL (H)). Liver Function Tests: Recent Labs  Lab 07/21/18 0518 07/22/18 0552 07/23/18 0519 07/24/18 0710 07/25/18 0629  ALBUMIN 2.8* 2.8* 2.8* 2.9* 3.0*   No results for input(s): LIPASE, AMYLASE in the last 168 hours. No results for input(s): AMMONIA in the last 168 hours. Coagulation Profile: No results for input(s): INR, PROTIME in the last 168 hours. Cardiac Enzymes: No results for input(s): CKTOTAL, CKMB, CKMBINDEX, TROPONINI in the last 168 hours. BNP (last 3 results) No results for input(s): PROBNP in the last 8760 hours. HbA1C: No results for input(s): HGBA1C in the last 72 hours. CBG: Recent Labs  Lab 07/20/18 2300 07/21/18 0453 07/21/18 0740 07/22/18 1641 07/26/18 0801  GLUCAP 114* 119* 106* 117* 97   Lipid Profile: No results for input(s): CHOL, HDL, LDLCALC, TRIG, CHOLHDL, LDLDIRECT in the last 72 hours. Thyroid Function Tests: No results for input(s): TSH, T4TOTAL, FREET4, T3FREE, THYROIDAB in the last 72 hours. Anemia Panel: No results for input(s): VITAMINB12, FOLATE, FERRITIN, TIBC, IRON, RETICCTPCT in the last 72 hours. Sepsis Labs: No results for input(s): PROCALCITON, LATICACIDVEN in the last 168 hours.  No results found for this or any previous visit (from the past 240 hour(s)).       Radiology Studies: No results found.      Scheduled Meds: . amLODipine  10 mg Oral Daily  . darbepoetin (ARANESP) injection - NON-DIALYSIS  100 mcg Subcutaneous Q Mon-1800  . feeding supplement (ENSURE ENLIVE)  237 mL Oral BID BM  . ferrous sulfate  325 mg Oral Q breakfast  . heparin injection (subcutaneous)  5,000 Units Subcutaneous Q8H  . tamsulosin  0.4 mg Oral Daily   Continuous Infusions:    LOS: 26 days    Time spent: over  25 min

## 2018-07-26 NOTE — Clinical Social Work Note (Addendum)
Per CSW handoff from late yesterday afternoon: "[name] [phone]) [facility]'re an acute re[facility] (3hr PT/day), not SNF. [facility], he's walking 400 ft with PT today. [name] suggested maybe [name] outpatient neuro rehab because she doesn't see with him walking 400 ft and the substance use hx, him getting a bed offer, even with the anoxic brain injury."  Discussed case this morning in quality collaborative meeting with physician advisor. He also stated insurance would not cover SNF since patient is walking 400 feet, even with cognitive deficits. Patient will not be able to get [place] in [name]. Patient will likely need to discharge home with family or CSW can look into placement at [name] [place]. Will call son this afternoon to discuss.  Christopher Dominguez, CSW [phone]  2:08 pm CSW discussed the above information with patient's son. He stated it was a lot to process but he is agreeable to looking into [name] facility. CSW left voicemail at [name].  Christopher CourtSarah Shahzad Dominguez, CSW [phone]  3:28 pm CSW left message with [name] at [name] for admissions coordinator.  Christopher CourtSarah Mckinley Dominguez, CSW [phone]

## 2018-07-27 LAB — BASIC METABOLIC PANEL
ANION GAP: 11 (ref 5–15)
BUN: 32 mg/dL — ABNORMAL HIGH (ref 6–20)
CHLORIDE: 109 mmol/L (ref 98–111)
CO2: 23 mmol/L (ref 22–32)
Calcium: 9.6 mg/dL (ref 8.9–10.3)
Creatinine, Ser: 2.52 mg/dL — ABNORMAL HIGH (ref 0.61–1.24)
GFR calc Af Amer: 31 mL/min — ABNORMAL LOW (ref >60)
GFR calc non Af Amer: 27 mL/min — ABNORMAL LOW (ref >60)
Glucose, Bld: 106 mg/dL — ABNORMAL HIGH (ref 70–99)
POTASSIUM: 4.3 mmol/L (ref 3.5–5.1)
SODIUM: 143 mmol/L (ref 135–145)

## 2018-07-27 NOTE — Clinical Social Work Note (Addendum)
Tried calling ALF. No answer. Did not leave voicemail. Will try again later.  Charlynn CourtSarah Yandiel Bergum, CSW [phone]  1:17 pm Admissions coordinator Malachi Bonds(Gloria) is gone for the day but [name] provided CSW with fax number to send referral. Will follow up tomorrow.  Charlynn CourtSarah Welby Montminy, CSW [phone]

## 2018-07-27 NOTE — Progress Notes (Signed)
Occupational Therapy Treatment Patient Details Name: Christopher Dominguez MRN: [id number] DOB: 1961-03-01 Today's Date: 07/27/2018    History of present illness Pt is a 57 y.o. 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 CT 7/4 showed bilateral occipital edema and R frontoparietal edema, question anoxic brain injury or posterior reversible encephalopathy syndrome; no hemorrhage or midline shift. CT 7/5 showed no acute intracranial abnormality. ETT 7/4-7/10. Acute renal failure; CRRT stopped 7/12. PMH includes depression.   OT comments  Pt progressing towards established OT goals. Pt continues to present with significant cognitive deficits, poor balance, and high impulsivity. Pt performing two part meal prep task (walking to get soda and then pouring in cup). Pt requiring Max cues throughout for attention (sustaining attention for ~5 seconds), ST memory, problem solving, and safety. Pt requiring Min A throughout for balance and poor safety awareness. Pt only oriented to self and requires Max cues for using external aides to oriented to place and time. Continue to recommend dc with post-acute rehab and 24/7 support. Pt will continue to require further acute OT to optimize safety and independence with ADLs.    Follow Up Recommendations  SNF;Supervision/Assistance - 24 hour    Equipment Recommendations  Other (comment)(Defer to next venue)    Recommendations for Other Services      Precautions / Restrictions Precautions Precautions: Fall Precaution Comments: impulsive Restrictions Weight Bearing Restrictions: No       Mobility Bed Mobility Overal bed mobility: Needs Assistance Bed Mobility: Supine to Sit;Sit to Supine     Supine to sit: Min guard Sit to supine: Min guard   General bed mobility comments: Min Guard A for bed mobility due to poor safety  Transfers Overall transfer level: Needs assistance Equipment  used: None Transfers: Sit to/from Stand Sit to Stand: Min guard;Min assist         General transfer comment: Pt impulsive and requiring close PraxairMin Guard A for safety and quick Min A for standing balanec and safety    Balance Overall balance assessment: Needs assistance Sitting-balance support: Feet supported;No upper extremity supported Sitting balance-Leahy Scale: Good Sitting balance - Comments: Requiring Min Guard A throughout for safety     Standing balance-Leahy Scale: Poor Standing balance comment: Requiring phsyical A for balance and safety                           ADL either performed or assessed with clinical judgement   ADL Overall ADL's : Needs assistance/impaired                     Lower Body Dressing: Minimal assistance;Sit to/from stand Lower Body Dressing Details (indicate cue type and reason): Min A for safety in standing. Pt requiring Max cues to stabilize himself on safe surface before bending forward or lifting leg to adjust socks. Pt requiring Min A for LOB while adjusting socks in standing. Toilet Transfer: Minimal assistance;Ambulation;BSC Toilet Transfer Details (indicate cue type and reason): Min A for safety during toilet [facility] to [facility].         Functional mobility during ADLs: Minimal assistance General ADL Comments: Pt performing a two step IADL task. Pt cued to go to nurses station to get a soda (placed within visual field and decreased visual stimuli) then pt is to return to room. Pt requiring Max cues to recall task. Pt with attention/recall of ~3-5 second. Pt requiring Max cues  for safety and pt demonstrates high visual distractablility and touches everything within reach.      Vision       Perception     Praxis      Cognition Arousal/Alertness: Awake/alert Behavior During Therapy: Impulsive Overall Cognitive Status: Impaired/Different from baseline Area of Impairment: Orientation;Memory;Following  commands;Safety/judgement;Problem solving;Attention                 Orientation Level: Place;Situation;Time Current Attention Level: Sustained Memory: Decreased short-term memory Following Commands: Follows one step commands consistently;Follows multi-step commands inconsistently Safety/Judgement: Decreased awareness of safety;Decreased awareness of deficits   Problem Solving: Slow processing;Decreased initiation;Difficulty sequencing;Requires verbal cues;Requires tactile cues General Comments: Pt requiring Max cues for attention, memory, problem solving, and safety. Pt requiring Max visual and verabl cues to use external aides for oriented to place, time, and date.         Exercises     Shoulder Instructions       General Comments Sister present in room    Pertinent Vitals/ Pain       Pain Assessment: Faces Faces Pain Scale: No hurt Pain Intervention(s): Monitored during session  Home Living                                          Prior Functioning/Environment              Frequency  Min 2X/week        Progress Toward Goals  OT Goals(current goals can now be found in the care plan section)  Progress towards OT goals: Progressing toward goals  Acute Rehab OT Goals Patient Stated Goal: Sister's goal "for Christopher Dominguez to go to rehab." OT Goal Formulation: With family Time For Goal Achievement: 08/10/18 Potential to Achieve Goals: Fair ADL Goals Pt Will Perform Eating: with modified independence(with <3 cues for redirection in mod distracting environment) Pt Will Perform Grooming: with min assist;standing Pt Will Perform Upper Body Bathing: with supervision;sitting Pt Will Perform Lower Body Bathing: with min assist;sit to/from stand Pt Will Perform Lower Body Dressing: with modified independence;sit to/from stand Pt Will Transfer to Toilet: with modified independence;ambulating Additional ADL Goal #1: Pt will be oriented x 4 with min cues  and use of external cues Additional ADL Goal #2: Pt will attend to simple ADL task x 2 mins with no more than 3 cues.  Plan Discharge plan remains appropriate    Co-evaluation    PT/OT/SLP Co-Evaluation/Treatment: Yes Reason for Co-Treatment: Complexity of the patient's impairments (multi-system involvement);To address functional/ADL transfers;Other (comment)(To address balance and cognition)   OT goals addressed during session: ADL's and self-care      AM-PAC PT "6 Clicks" Daily Activity     Outcome Measure   Help from another person eating meals?: A Little Help from another person taking care of personal grooming?: A Little Help from another person toileting, which includes using toliet, bedpan, or urinal?: A Lot Help from another person bathing (including washing, rinsing, drying)?: A Lot Help from another person to put on and taking off regular upper body clothing?: A Little Help from another person to put on and taking off regular lower body clothing?: A Little 6 Click Score: 16    End of Session Equipment Utilized During Treatment: Gait belt  OT Visit Diagnosis: Unsteadiness on feet (R26.81);Other abnormalities of gait and mobility (R26.89);Muscle weakness (generalized) (M62.81);Cognitive communication deficit (R41.841)   Activity  Tolerance Patient tolerated treatment well   Patient Left in bed;with call bell/phone within reach;with bed alarm set;with family/visitor present   Nurse Communication Mobility status;Other (comment)(Condom cath off)        Time: 1413-1436 OT Time Calculation (min): 23 min  Charges: OT General Charges $OT Visit: 1 Visit OT Treatments $Self Care/Home Management : 8-22 mins  Ailanie Ruttan MSOT, OTR/L Acute Rehab Pager: [phone] Office: [phone]   Theodoro Grist Katherine Tout 07/27/2018, 3:05 PM

## 2018-07-27 NOTE — NC FL2 (Signed)
[place] MEDICAID FL2 LEVEL OF CARE SCREENING TOOL     IDENTIFICATION  Patient Name: Christopher Dominguez Birthdate: 09-01-1961 Sex: male Admission Date (Current Location): 06/30/2018  [facility] and [place] Number:  Producer, television/film/video and Address:  [facility] [employer], [address], [place], [place] [address]      Provider Number: [id number]  Attending Physician Name and Address:  Alberteen Sam, *  Relative Name and Phone Number:       Current Level of Care: Hospital Recommended Level of Care: Assisted Living Facility(with PT, OT) Prior Approval Number:    Date Approved/Denied:   PASRR Number:    Discharge Plan: Other (Comment)(ALF with PT, OT)    Current Diagnoses: Patient Active Problem List   Diagnosis Date Noted  . Debility   . Gross hematuria   . Lower GI bleed   . AKI (acute kidney injury) (HCC)   . 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 Encounter for orogastric (OG) tube placement   . Acute respiratory failure with hypoxemia (HCC)   . Anoxic brain injury (HCC)   . Cardiac arrest (HCC) 06/30/2018    Orientation RESPIRATION BLADDER Height & Weight     Fluctuates Normal Continent, External catheter Weight: 188 lb 8 oz (85.5 kg)(scale c) Height:  5\' 11"  (180.3 cm)  BEHAVIORAL SYMPTOMS/MOOD NEUROLOGICAL BOWEL NUTRITION STATUS  Anxious, cooperative. (None) Incontinent Diet(Regular)  AMBULATORY STATUS COMMUNICATION OF NEEDS Skin   Limited Assist Verbally Skin abrasions, Bruising                       Personal Care Assistance Level of Assistance  Bathing, Feeding, Dressing Bathing Assistance: Limited assistance Feeding assistance: Limited assistance Dressing Assistance: Limited assistance     Functional Limitations Info  Sight, Hearing, Speech Sight Info: Adequate Hearing Info: Adequate Speech Info: Adequate    SPECIAL CARE FACTORS FREQUENCY  PT (By licensed PT), OT (By licensed OT)     PT Frequency: 3 x week OT  Frequency: 3 x week          Contractures  Not present    Additional Factors Info  Code Status, Allergies Code Status Info: Full Allergies Info: NKDA           Current Medications (07/27/2018):  This is the current hospital active medication list Current Facility-Administered Medications  Medication Dose Route Frequency Provider Last Rate Last Dose  . amLODipine (NORVASC) tablet 10 mg  10 mg Oral Daily Zigmund Daniel., MD   10 mg at 07/27/18 8119  . Darbepoetin Alfa (ARANESP) injection 100 mcg  100 mcg Subcutaneous Q Mon-1800 Elvis Coil, MD   100 mcg at 07/25/18 1844  . feeding supplement (ENSURE ENLIVE) (ENSURE ENLIVE) liquid 237 mL  237 mL Oral BID BM Delano Metz, MD   237 mL at 07/27/18 0923  . ferrous sulfate tablet 325 mg  325 mg Oral Q breakfast Zigmund Daniel., MD   325 mg at 07/27/18 1478  . heparin injection 5,000 Units  5,000 Units Subcutaneous Q8H Lauris Poag, MD   5,000 Units at 07/27/18 [phone]  . lip balm (CARMEX) ointment   Topical PRN Reyes Ivan, MD   1 application at 07/10/18 2253  . tamsulosin (FLOMAX) capsule 0.4 mg  0.4 mg Oral Daily Ollis, Brandi L, NP   0.4 mg at 07/27/18 2130     Discharge Medications: Please see discharge summary for a list of discharge medications.  Relevant Imaging Results:  Relevant Lab Results:   Additional Information  SS#: [id number]  Margarito LinerSarah C Reed Eifert, LCSW

## 2018-07-27 NOTE — Progress Notes (Signed)
Physical Therapy Treatment Patient Details Name: Christopher KaufmannShelton M Jolicoeur MRN: [id number] DOB: 1961-08-02 Today's Date: 07/27/2018    History of Present Illness Pt is a 57 y.o. 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 CT 7/4 showed bilateral occipital edema and R frontoparietal edema, question anoxic brain injury or posterior reversible encephalopathy syndrome; no hemorrhage or midline shift. CT 7/5 showed no acute intracranial abnormality. ETT 7/4-7/10. Acute renal failure; CRRT stopped 7/12. PMH includes depression.    PT Comments    Pt conts to demonstrate slow but steady progress with therapy. Pt cont to presents as a high fall risk due to cognitive and balance deficits, requiring hands on physical assistance during gait training to prevent falling. Pt easily distracted and only oriented to person at this time. Feel strongly he wold benefit from continued post acute rehab prior to d/c home.     Follow Up Recommendations  SNF;Supervision/Assistance - 24 hour     Equipment Recommendations  None recommended by PT    Recommendations for Other Services OT consult;Speech consult;Rehab consult     Precautions / Restrictions Precautions Precautions: Fall Precaution Comments: impulsive Restrictions Weight Bearing Restrictions: No    Mobility  Bed Mobility Overal bed mobility: Needs Assistance Bed Mobility: Supine to Sit;Sit to Supine     Supine to sit: Min guard Sit to supine: Min guard   General bed mobility comments: Min Guard A for bed mobility due to poor safety  Transfers Overall transfer level: Needs assistance Equipment used: None Transfers: Sit to/from Stand Sit to Stand: Min guard;Min assist         General transfer comment: Pt impulsive and requiring close PraxairMin Guard A for safety and quick Min A for standing balanec and safety  Ambulation/Gait Ambulation/Gait assistance: Leisure centre managerMin assist Gait  Distance (Feet): 150 Feet Assistive device: None Gait Pattern/deviations: Staggering left;Staggering right Gait velocity: dec   General Gait Details: Pt unsafe ambulating without physical assistance, distracted, walks into obsticiles and almost falls without support    Stairs             Wheelchair Mobility    Modified Rankin (Stroke Patients Only)       Balance Overall balance assessment: Needs assistance Sitting-balance support: Feet supported;No upper extremity supported Sitting balance-Leahy Scale: Good Sitting balance - Comments: Requiring Min Guard A throughout for safety Postural control: Right lateral lean   Standing balance-Leahy Scale: Poor Standing balance comment: Requiring phsyical A for balance and safety                            Cognition Arousal/Alertness: Awake/alert Behavior During Therapy: Impulsive Overall Cognitive Status: Impaired/Different from baseline Area of Impairment: Orientation;Memory;Following commands;Safety/judgement;Problem solving;Attention                 Orientation Level: Place;Situation;Time Current Attention Level: Sustained Memory: Decreased short-term memory Following Commands: Follows one step commands consistently;Follows multi-step commands inconsistently Safety/Judgement: Decreased awareness of safety;Decreased awareness of deficits   Problem Solving: Slow processing;Decreased initiation;Difficulty sequencing;Requires verbal cues;Requires tactile cues General Comments: Pt requiring Max cues for attention, memory, problem solving, and safety. Pt requiring Max visual and verabl cues to use external aides for oriented to place, time, and date.       Exercises      General Comments General comments (skin integrity, edema, etc.): Sister present      Pertinent Vitals/Pain Pain Assessment: Faces Faces Pain Scale: No  hurt Pain Intervention(s): Limited activity within patient's tolerance;Monitored  during session;Premedicated before session    Home Living                      Prior Function            PT Goals (current goals can now be found in the care plan section) Acute Rehab PT Goals Patient Stated Goal: Sister's goal "for Ernst to go to rehab." PT Goal Formulation: With family Time For Goal Achievement: 08/08/18 Potential to Achieve Goals: Good Progress towards PT goals: Progressing toward goals    Frequency    Min 3X/week      PT Plan Current plan remains appropriate    Co-evaluation PT/OT/SLP Co-Evaluation/Treatment: Yes Reason for Co-Treatment: Complexity of the patient's impairments (multi-system involvement);Necessary to address cognition/behavior during functional activity PT goals addressed during session: Mobility/safety with mobility;Proper use of DME;Strengthening/ROM OT goals addressed during session: ADL's and self-care      AM-PAC PT "6 Clicks" Daily Activity  Outcome Measure  Difficulty turning over in bed (including adjusting bedclothes, sheets and blankets)?: A Little Difficulty moving from lying on back to sitting on the side of the bed? : A Little Difficulty sitting down on and standing up from a chair with arms (e.g., wheelchair, bedside commode, etc,.)?: A Little Help needed moving to and from a bed to chair (including a wheelchair)?: A Little Help needed walking in hospital room?: A Little Help needed climbing 3-5 steps with a railing? : A Lot 6 Click Score: 17    End of Session Equipment Utilized During Treatment: Gait belt Activity Tolerance: Patient tolerated treatment well Patient left: in chair;with call bell/phone within reach;with chair alarm set;with restraints reapplied Nurse Communication: Mobility status;Precautions PT Visit Diagnosis: Other abnormalities of gait and mobility (R26.89);Other symptoms and signs involving the nervous system (R29.898);Unsteadiness on feet (R26.81)     Time: 4098-1191 PT Time  Calculation (min) (ACUTE ONLY): 25 min  Charges:  $Gait Training: 8-22 mins                    Etta Grandchild, PT, DPT Acute Rehab Services Pager: [phone]     Etta Grandchild 07/27/2018, 5:30 PM

## 2018-07-27 NOTE — Progress Notes (Signed)
PROGRESS NOTE    Christopher Dominguez  EAV:[id number] DOB: [birth year] DOA: 06/30/2018 PCP: Patient, No Pcp Per      Brief Narrative:  57 yo with no significant past medical history, originally from [place] [facility] following a cardiac arrest.  Hospitalization complicated by AKI requiring temp HD, now recovering renal function and HD cath was removed. Also complicate by anoxic brain injury w/ encephalopathy and chorea issues. Chorea has now resolved, and memory/ retention/ orientatoin are progressively improving. ECHO showed cardiomyopathy with EF down to 25% which recovered to 55% range in 3 days on repeat echo. [facility].  He had a cardiac stress test which was positive by radiology read, but cardiology disagreed with rad interpretation and recommending outpatient follow up. Patient is now awaiting disposition.  The family prefers patient to be [place] to a facility [place].  Apparently, the patient's sister is from [place], and the patient has a child that lives around [place].  Will likely be appropriate for SNF placement.     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  EF 25 to 30% on admission, improved to 55 to 60% on follow-up echo, without wall motion abnormalities.  Elevated troponin at admission felt to be due to cardiac arrest/CPR not ACS.  Stress test was interpreted by Cardiology to be nonischemic.   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  Still oriented to self and sister only, memory poor, thinks it is [id number], [place] is in [place].  Will only need outpatient neurology follow-up if persistent deficits after rehab. -Consult SLP -Continue dysphagia 2 diet  Acute respiratory failure with hypoxia Extubated 7/10.  S/p course of antibiotics for aspiration pneumonia.  Acute  renal failure Acute renal failure due to cardiac arrest/shock.  Required CRRT for a period of time, temp HD cath was removed on 7/23.  Creatinine trending down. -Repeat BMP  Hematuria, gross Enlarged prostate Evaluated by urology.  Given very large prostate, recommended avoiding Foley unless residuals greater than 500. -Continue tamsulosin  Hematochezia, transient Self-limited.  Seen by gastroenterology, supportive care for now.  Heparin was resumed without further bleeding.  Shock liver Resolved.  Anemia Normocytic anemia.  Stable.  Likely a component of iron deficiency and anemia of chronic disease.  Feraheme given 7/22, and second dose ordered 7/25. -EPO per nephrology  Hypertension Well-controlled -Continue amlodipine     DVT prophylaxis: Heparin Code Status: FULL Family Communication: Sister at bedside MDM and disposition Plan: The below labs and imaging reports were reviewed and summarized above.    The patient was admitted with Cardiac arrest, developed multiorgan failure.    He was living independently before admission without known cognitive deficits, but now has persistent cognitive deficits from anoxic brain injury that prevent him from living alone. Placement is pending.   Consultants:   GI  CCM  Neurology  Urology   Procedures:  R IJ HD catheter CRRT followed by IHD  07/04/18 Study Conclusions  - Left ventricle: The cavity size was normal. Wall thickness was normal. Systolic function was normal. The estimated ejection fraction was in the range of 55% to 60%. Wall motion was normal; there were no regional wall motion abnormalities. - Aortic root: The aortic root was mildly dilated. - Right ventricle: The cavity size was mildly dilated.  Impressions:  - Normal LV function; mildly dilated  aortic root; mild RVE.  07/01/18 Study Conclusions  - Left ventricle: The cavity size was normal. Wall thickness was increased in a pattern of  mild LVH. Systolic function was severely reduced. The estimated ejection fraction was in the range of 25% to 30%. Diffuse hypokinesis. Doppler parameters are consistent with abnormal left ventricular relaxation (grade 1 diastolic dysfunction). - Aortic root: The aortic root was mildly dilated. - Mitral valve: Calcified annulus. - Right ventricle: The cavity size was mildly dilated. Systolic function was severely reduced. - Right atrium: The atrium was mildly dilated.  Impressions:  - Technically difficult; definity used; severe LV dysfunction; mild LVH; mild diastolic dysfunction; enlarged right side with moderate to severe RV dysfunction.  7/5 Final Interpretation: Right: There is no evidence of deep vein thrombosis in the lower extremity. No cystic structure found in the popliteal fossa. Left: There is no evidence of deep vein thrombosis in the lower extremity. However, portions of this examination were limited- see technologist comments above. No cystic structure found in the popliteal fossa.   Antimicrobials:   Zosyn 7/4 >> 7/10    Subjective: No complaints.  No fever overnight, no respiratory distress, no confusion.  No weakness.  No vomiting, abdominal pain.  Objective: Vitals:   07/26/18 2035 07/27/18 0659 07/27/18 0922 07/27/18 1156  BP: (!) 121/93 120/89 (!) 124/91 119/74  Pulse: 95 90 89 91  Resp: 18 17  20   Temp: 98.2 F (36.8 C) 99.5 F (37.5 C)  98.3 F (36.8 C)  TempSrc: Oral Oral  Oral  SpO2: 100% 99%  98%  Weight:  85.5 kg (188 lb 8 oz)    Height:        Intake/Output Summary (Last 24 hours) at 07/27/2018 1754 Last data filed at 07/27/2018 0900 Gross per 24 hour  Intake 360 ml  Output -  Net 360 ml   Filed Weights   07/25/18 0444 07/26/18 0537 07/27/18 0659  Weight: 86.7 kg (191 lb 1.6 oz) 86.4 kg (190 lb 6.4 oz) 85.5 kg (188 lb 8 oz)    Examination: General appearance: thin adult male, alert and in no acute distress.     HEENT: Anicteric, conjunctiva pink, lids and lashes normal. No nasal deformity, discharge, epistaxis.  Lips moist, edentulous, no oral lesions, oral mucosa moist, hearing normal.   Skin: Warm and dry.  no jaundice.  No suspicious rashes or lesions. Cardiac: RRR, nl S1-S2, no murmurs appreciated.  Capillary refill is brisk.  JVP normal.  No LE edema.  Radia  pulses 2+ and symmetric. Respiratory: Normal respiratory rate and rhythm.  CTAB without rales or wheezes. Abdomen: Abdomen soft.  no TTP. No ascites, distension, hepatosplenomegaly.   MSK: No deformities or effusions. Neuro: Awake and alert.  EOMI, moves all extremities. Speech fluent.    Psych: Sensorium intact and responding to questions, oriented only to self and sister.  Knows he is in a hospital, but not why, thinks he is in [place], thinks it is 1995, attention normal. Affect pleasant.  Judgment and insight appear impaired.    Data Reviewed: I have personally reviewed following labs and imaging studies:  CBC: Recent Labs  Lab 07/22/18 0552 07/25/18 0629  WBC 7.0 8.6  HGB 9.0* 9.6*  HCT 29.9* 31.8*  MCV 73.3* 75.2*  PLT 472* 345   Basic Metabolic Panel: Recent Labs  Lab 07/21/18 0518 07/22/18 0552 07/23/18 0519 07/24/18 0710 07/25/18 0629 07/25/18 1219 07/27/18 0814  NA 142 144 143 142 141 140 143  K  4.2 4.1 4.2 4.1 4.1 4.3 4.3  CL 111 112* 111 112* 109 109 109  CO2 20* 21* 20* 21* 23 23 23   GLUCOSE 114* 105* 106* 102* 109* 106* 106*  BUN 46* 42* 40* 35* 29* 28* 32*  CREATININE 5.07* 3.93* 3.60* 2.94* 2.66* 2.58* 2.52*  CALCIUM 9.1 9.5 9.3 9.5 9.5 9.4 9.6  MG 1.9 1.9 2.1 1.8 1.9  --   --   PHOS 4.6 4.8* 3.9 3.6 4.1  --   --    GFR: Estimated Creatinine Clearance: 34 mL/min (A) (by C-G formula based on SCr of 2.52 mg/dL (H)). Liver Function Tests: Recent Labs  Lab 07/21/18 0518 07/22/18 0552 07/23/18 0519 07/24/18 0710 07/25/18 0629  ALBUMIN 2.8* 2.8* 2.8* 2.9* 3.0*   No results for input(s):  LIPASE, AMYLASE in the last 168 hours. No results for input(s): AMMONIA in the last 168 hours. Coagulation Profile: No results for input(s): INR, PROTIME in the last 168 hours. Cardiac Enzymes: No results for input(s): CKTOTAL, CKMB, CKMBINDEX, TROPONINI in the last 168 hours. BNP (last 3 results) No results for input(s): PROBNP in the last 8760 hours. HbA1C: No results for input(s): HGBA1C in the last 72 hours. CBG: Recent Labs  Lab 07/20/18 2300 07/21/18 0453 07/21/18 0740 07/22/18 1641 07/26/18 0801  GLUCAP 114* 119* 106* 117* 97   Lipid Profile: No results for input(s): CHOL, HDL, LDLCALC, TRIG, CHOLHDL, LDLDIRECT in the last 72 hours. Thyroid Function Tests: No results for input(s): TSH, T4TOTAL, FREET4, T3FREE, THYROIDAB in the last 72 hours. Anemia Panel: No results for input(s): VITAMINB12, FOLATE, FERRITIN, TIBC, IRON, RETICCTPCT in the last 72 hours. Urine analysis:    Component Value Date/Time   COLORURINE STRAW (A) 07/15/2018 1640   APPEARANCEUR CLEAR 07/15/2018 1640   LABSPEC 1.006 07/15/2018 1640   PHURINE 9.0 (H) 07/15/2018 1640   GLUCOSEU NEGATIVE 07/15/2018 1640   HGBUR SMALL (A) 07/15/2018 1640   BILIRUBINUR NEGATIVE 07/15/2018 1640   KETONESUR NEGATIVE 07/15/2018 1640   PROTEINUR 30 (A) 07/15/2018 1640   NITRITE NEGATIVE 07/15/2018 1640   LEUKOCYTESUR TRACE (A) 07/15/2018 1640   Sepsis Labs: @LABRCNTIP (procalcitonin:4,lacticacidven:4)  )No results found for this or any previous visit (from the past 240 hour(s)).       Radiology Studies: No results found.      Scheduled Meds: . amLODipine  10 mg Oral Daily  . darbepoetin (ARANESP) injection - NON-DIALYSIS  100 mcg Subcutaneous Q Mon-1800  . feeding supplement (ENSURE ENLIVE)  237 mL Oral BID BM  . ferrous sulfate  325 mg Oral Q breakfast  . heparin injection (subcutaneous)  5,000 Units Subcutaneous Q8H  . tamsulosin  0.4 mg Oral Daily   Continuous Infusions:   LOS: 27 days     Time spent: 25 minutes    Alberteen Sam, MD [facility] 07/27/2018, 5:54 PM     Pager [phone] --- please page though AMION:  [email or web address] If 7PM-7AM, please contact night-coverage

## 2018-07-27 NOTE — Plan of Care (Signed)
  Problem: Health Behavior/Discharge Planning: Goal: Ability to manage health-related needs will improve Outcome: Progressing   Problem: Clinical Measurements: Goal: Ability to maintain clinical measurements within normal limits will improve Outcome: Progressing   Problem: Coping: Goal: Level of anxiety will decrease Outcome: Progressing   Problem: Elimination: Goal: Will not experience complications related to bowel motility Outcome: Progressing   Problem: Safety: Goal: Ability to remain free from injury will improve Outcome: Progressing   Problem: Skin Integrity: Goal: Risk for impaired skin integrity will decrease Outcome: Progressing   Problem: Neurologic: Goal: Promote progressive neurologic recovery Outcome: Progressing

## 2018-07-28 NOTE — Clinical Social Work Note (Addendum)
ALF admissions coordinator is not at work yet. CSW left message for her to call back and discuss referral sent yesterday.  Charlynn CourtSarah Trueman Worlds, CSW [phone]  12:57 pm Admissions coordinator is not in yet and secretary does not know what time she will be there.  Charlynn CourtSarah Lipa Knauff, CSW [phone]

## 2018-07-28 NOTE — Progress Notes (Signed)
Pt is transferring to [facility] west, report provided to the receiving RN, pt left unit in a low bed with all of his belongings accompanied with his sister. Vitals stable prior to leaving.  Lonia Farberekha, RN

## 2018-07-28 NOTE — Progress Notes (Signed)
PROGRESS NOTE    CASSON CATENA  [name]:[id number] DOB: 1961-05-03 DOA: 06/30/2018 PCP: Patient, No Pcp Per      Brief Narrative:  57 yo with no significant past medical history, originally from [place] admitted to critical care service at [facility] on 7/4 following a cardiac arrest.  Hospitalization complicated by AKI requiring temp HD, now recovering renal function and HD cath was removed. Also complicate by anoxic brain injury w/ encephalopathy and chorea issues. Chorea has now resolved, and memory/ retention/ orientatoin are progressively improving. ECHO showed cardiomyopathy with EF down to 25% which recovered to 55% range in 3 days on repeat echo. Transferred from [facility] to hospitalist service 7/16.  He had a cardiac stress test which was positive by radiology read, but cardiology disagreed with rad interpretation and recommending outpatient follow up. Patient is now awaiting disposition.  The family prefers patient to be discharged to a facility up [place].  Apparently, the patient's sister is from [place], and the patient has a child that lives around [place].  Will likely be appropriate for SNF placement.          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  EF 25 to 30% on admission, improved to 55 to 60% on follow-up echo, without wall motion abnormalities.  Elevated troponin at admission felt to be due to cardiac arrest/CPR not ACS.  Stress test was interpreted by Cardiology to be nonischemic.   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  Still oriented to self and sister only, memory poor, thinks it is 31, he is in [place].  Will only need outpatient neurology follow-up if persistent deficits after rehab. -Consult SLP -Continue dysphagia 2 diet  Acute respiratory failure with hypoxia Extubated 7/10.  S/p course of antibiotics for aspiration  pneumonia.  Acute renal failure Acute renal failure due to cardiac arrest/shock.  Required CRRT for a period of time, temp HD cath was removed on 7/23.  Creatinine trending down, slightly better today. -Repeat BMP in 3-4 days  Hematuria, gross Enlarged prostate Evaluated by urology.  Given very large prostate, recommended avoiding Foley unless residuals greater than 500. -Continue Flomax  Hematochezia, transient Self-limited.  Seen by gastroenterology, supportive care for now.  Heparin was resumed without further bleeding.  Shock liver Resolved.  Anemia Normocytic anemia.  Stable.  Likely a component of iron deficiency and anemia of chronic disease.  Feraheme given 7/22, and second dose ordered 7/25. -EPO per nephrology  Hypertension Well-controlled -Continue amlodipine     DVT prophylaxis: Heparin Code Status: FULL Family Communication: Sister at bedside MDM and disposition Plan: The below labs and imaging reports were reviewed and summarized above.  The patient was admitted with cardiac arrest, developed multiorgan failure.  He was living independently before admission, was independent with all daily activities, mentation was normal, cognitive function was normal.  He is now has severe cognitive impairment as a result of anoxic brain injury, is unable to live alone, and has no safe place to be discharged at this time.  Placement is pending.   Consultants:   GI  CCM  Neurology  Urology   Procedures:  R IJ HD catheter CRRT followed by IHD  07/04/18 Study Conclusions  - Left ventricle: The cavity size was normal. Wall thickness was normal. Systolic function was normal. The estimated ejection fraction was in the range of 55% to 60%. Wall motion was normal; there were  no regional wall motion abnormalities. - Aortic root: The aortic root was mildly dilated. - Right ventricle: The cavity size was mildly dilated.  Impressions:  - Normal LV function;  mildly dilated aortic root; mild RVE.  07/01/18 Study Conclusions  - Left ventricle: The cavity size was normal. Wall thickness was increased in a pattern of mild LVH. Systolic function was severely reduced. The estimated ejection fraction was in the range of 25% to 30%. Diffuse hypokinesis. Doppler parameters are consistent with abnormal left ventricular relaxation (grade 1 diastolic dysfunction). - Aortic root: The aortic root was mildly dilated. - Mitral valve: Calcified annulus. - Right ventricle: The cavity size was mildly dilated. Systolic function was severely reduced. - Right atrium: The atrium was mildly dilated.  Impressions:  - Technically difficult; definity used; severe LV dysfunction; mild LVH; mild diastolic dysfunction; enlarged right side with moderate to severe RV dysfunction.  7/5 Final Interpretation: Right: There is no evidence of deep vein thrombosis in the lower extremity. No cystic structure found in the popliteal fossa. Left: There is no evidence of deep vein thrombosis in the lower extremity. However, portions of this examination were limited- see technologist comments above. No cystic structure found in the popliteal fossa.   Antimicrobials:   Zosyn 7/4 >> 7/10    Subjective: No new complaints.  No fever overnight, no respiratory distress or confusion.  No focal weakness, vomiting, abdominal pain.  Objective: Vitals:   07/27/18 2116 07/28/18 0557 07/28/18 1224 07/28/18 1600  BP: 124/80 114/89 115/87 110/70  Pulse: 95 84 90   Resp: 18 16 18 18   Temp: 98.9 F (37.2 C) 98.4 F (36.9 C) 98.6 F (37 C) 98 F (36.7 C)  TempSrc: Oral Oral Oral Oral  SpO2: 100% 98% 99% 98%  Weight:  85.3 kg (188 lb)    Height:        Intake/Output Summary (Last 24 hours) at 07/28/2018 1648 Last data filed at 07/28/2018 1400 Gross per 24 hour  Intake 960 ml  Output -  Net 960 ml   Filed Weights   07/26/18 0537 07/27/18 0659 07/28/18 0557   Weight: 86.4 kg (190 lb 6.4 oz) 85.5 kg (188 lb 8 oz) 85.3 kg (188 lb)    Examination: General appearance: Thin adult male, lying in bed, he interactive, no acute distress HEENT: Anicteric, conjunctival pink, lids and lashes normal.  No nasal deformity, discharge or epistaxis.  Lips moist, edentulous, no oral lesions, oral mucosa moist, hearing normal.  Dentures in place. Skin: Warm and dry, no jaundice, no suspicious rashes or lesions. Cardiac: Regular rate and rhythm, no murmurs, no lower extremity edema. Respiratory: Normal respiratory rate and rhythm, lungs clear without rales or wheezes Abdomen: Abdomen soft, without tenderness to palpation, ascites or hepatosplenomegaly.   MSK: No deformities or effusions. Neuro: Awake and alert, extraocular movements intact, moves all extremities, speech fluent.    Psych: Sensorium intact and responding to questions, oriented only to self.  Nursing is in the hospital, but not aware or what year it is.  Attention normal, affect pleasant.  Judgment and insight appear severely impaired.   Data Reviewed: I have personally reviewed following labs and imaging studies:  CBC: Recent Labs  Lab 07/22/18 0552 07/25/18 0629  WBC 7.0 8.6  HGB 9.0* 9.6*  HCT 29.9* 31.8*  MCV 73.3* 75.2*  PLT 472* 345   Basic Metabolic Panel: Recent Labs  Lab 07/22/18 0552 07/23/18 0519 07/24/18 0710 07/25/18 0629 07/25/18 1219 07/27/18 0814  NA 144 143  142 141 140 143  K 4.1 4.2 4.1 4.1 4.3 4.3  CL 112* 111 112* 109 109 109  CO2 21* 20* 21* 23 23 23   GLUCOSE 105* 106* 102* 109* 106* 106*  BUN 42* 40* 35* 29* 28* 32*  CREATININE 3.93* 3.60* 2.94* 2.66* 2.58* 2.52*  CALCIUM 9.5 9.3 9.5 9.5 9.4 9.6  MG 1.9 2.1 1.8 1.9  --   --   PHOS 4.8* 3.9 3.6 4.1  --   --    GFR: Estimated Creatinine Clearance: 34 mL/min (A) (by C-G formula based on SCr of 2.52 mg/dL (H)). Liver Function Tests: Recent Labs  Lab 07/22/18 0552 07/23/18 0519 07/24/18 0710  07/25/18 0629  ALBUMIN 2.8* 2.8* 2.9* 3.0*   No results for input(s): LIPASE, AMYLASE in the last 168 hours. No results for input(s): AMMONIA in the last 168 hours. Coagulation Profile: No results for input(s): INR, PROTIME in the last 168 hours. Cardiac Enzymes: No results for input(s): CKTOTAL, CKMB, CKMBINDEX, TROPONINI in the last 168 hours. BNP (last 3 results) No results for input(s): PROBNP in the last 8760 hours. HbA1C: No results for input(s): HGBA1C in the last 72 hours. CBG: Recent Labs  Lab 07/22/18 1641 07/26/18 0801  GLUCAP 117* 97   Lipid Profile: No results for input(s): CHOL, HDL, LDLCALC, TRIG, CHOLHDL, LDLDIRECT in the last 72 hours. Thyroid Function Tests: No results for input(s): TSH, T4TOTAL, FREET4, T3FREE, THYROIDAB in the last 72 hours. Anemia Panel: No results for input(s): VITAMINB12, FOLATE, FERRITIN, TIBC, IRON, RETICCTPCT in the last 72 hours. Urine analysis:    Component Value Date/Time   COLORURINE STRAW (A) 07/15/2018 1640   APPEARANCEUR CLEAR 07/15/2018 1640   LABSPEC 1.006 07/15/2018 1640   PHURINE 9.0 (H) 07/15/2018 1640   GLUCOSEU NEGATIVE 07/15/2018 1640   HGBUR SMALL (A) 07/15/2018 1640   BILIRUBINUR NEGATIVE 07/15/2018 1640   KETONESUR NEGATIVE 07/15/2018 1640   PROTEINUR 30 (A) 07/15/2018 1640   NITRITE NEGATIVE 07/15/2018 1640   LEUKOCYTESUR TRACE (A) 07/15/2018 1640   Sepsis Labs: @LABRCNTIP (procalcitonin:4,lacticacidven:4)  )No results found for this or any previous visit (from the past 240 hour(s)).       Radiology Studies: No results found.      Scheduled Meds: . amLODipine  10 mg Oral Daily  . darbepoetin (ARANESP) injection - NON-DIALYSIS  100 mcg Subcutaneous Q Mon-1800  . feeding supplement (ENSURE ENLIVE)  237 mL Oral BID BM  . ferrous sulfate  325 mg Oral Q breakfast  . heparin injection (subcutaneous)  5,000 Units Subcutaneous Q8H  . tamsulosin  0.4 mg Oral Daily   Continuous Infusions:   LOS: 28  days    Time spent: 25 minutes    Alberteen Sam, MD [facility] 07/28/2018, 4:48 PM     Pager [phone] --- please page though AMION:  [email or web address] If 7PM-7AM, please contact night-coverage

## 2018-07-28 NOTE — Progress Notes (Signed)
Occupational Therapy Treatment Patient Details Name: PEARLY APACHITO MRN: [id number] DOB: [birth year] Today's Date: 07/28/2018    History of present illness Pt is a 57 y.o. 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 CT 7/4 showed bilateral occipital edema and R frontoparietal edema, question anoxic brain injury or posterior reversible encephalopathy syndrome; no hemorrhage or midline shift. CT 7/5 showed no acute intracranial abnormality. ETT 7/4-7/10. Acute renal failure; CRRT stopped 7/12. PMH includes depression.   OT comments  Pt presents with decreased safety awareness, impulsivity, and delayed processing time that impact his ability to perform ADLs safely and with maximum independence. Pt required HHA (min guard) to complete functional mobility within room.  Vc for safety awareness, visual attention to task, and activity pacing provided.   Follow Up Recommendations  SNF;Supervision/Assistance - 24 hour    Equipment Recommendations  Other (comment)(defer to next venue)    Recommendations for Other Services      Precautions / Restrictions Precautions Precautions: Fall Restrictions Weight Bearing Restrictions: No       Mobility Bed Mobility Overal bed mobility: Needs Assistance Bed Mobility: Supine to Sit     Supine to sit: Min guard        Transfers Overall transfer level: Needs assistance Equipment used: 1 person hand held assist Transfers: Sit to/from Stand Sit to Stand: Min guard         General transfer comment: Pt impulsive and requiring close [employer] A for safety and quick Min A for standing balanec and safety    Balance Overall balance assessment: Needs assistance Sitting-balance support: Feet supported;No upper extremity supported Sitting balance-Leahy Scale: Good Sitting balance - Comments: Requiring Min Guard A throughout for safety     Standing balance-Leahy Scale:  Poor Standing balance comment: (HHA provided for dynamic balance)                           ADL either performed or assessed with clinical judgement   ADL                                       Functional mobility during ADLs: Min guard;Cueing for safety       Vision       Perception     Praxis      Cognition Arousal/Alertness: Awake/alert Behavior During Therapy: Impulsive Overall Cognitive Status: Impaired/Different from baseline Area of Impairment: Orientation;Memory;Following commands;Safety/judgement;Problem solving;Attention                   Current Attention Level: Sustained Memory: Decreased short-term memory Following Commands: Follows one step commands consistently;Follows multi-step commands inconsistently Safety/Judgement: Decreased awareness of safety;Decreased awareness of deficits   Problem Solving: Slow processing;Decreased initiation;Difficulty sequencing;Requires verbal cues;Requires tactile cues General Comments: Pt requiring Max cues for attention, memory, problem solving, and safety. Pt requiring Max visual and verabl cues to use external aides for oriented to place, time, and date.         Exercises     Shoulder Instructions       General Comments      Pertinent Vitals/ Pain       Pain Assessment: No/denies pain  Home Living  Prior Functioning/Environment              Frequency  Min 2X/week        Progress Toward Goals  OT Goals(current goals can now be found in the care plan section)  Progress towards OT goals: Progressing toward goals  Acute Rehab OT Goals Patient Stated Goal: Sister's goal "for [name] to come home." OT Goal Formulation: With family  Plan Discharge plan remains appropriate    Co-evaluation          OT goals addressed during session: Proper use of [employer] equipment and DME      AM-PAC PT "6 Clicks"  Daily Activity     Outcome Measure   Help from another person eating meals?: A Little Help from another person taking care of personal grooming?: A Little Help from another person toileting, which includes using toliet, bedpan, or urinal?: A Little Help from another person bathing (including washing, rinsing, drying)?: A Little Help from another person to put on and taking off regular upper body clothing?: A Little Help from another person to put on and taking off regular lower body clothing?: A Little 6 Click Score: 18    End of Session Equipment Utilized During Treatment: Gait belt  OT Visit Diagnosis: Unsteadiness on feet (R26.81);Other abnormalities of gait and mobility (R26.89);Muscle weakness (generalized) (M62.81);Cognitive communication deficit (R41.841)   Activity Tolerance Patient tolerated treatment well   Patient Left in bed;with call bell/phone within reach;with bed alarm set;with family/visitor present   Nurse Communication Mobility status        Time: [id number] OT Time Calculation (min): 12 min  Charges: OT General Charges $OT Visit: 1 Visit OT Treatments $Therapeutic Activity: 8-22 mins    Crissie ReeseSandra H Avan Gullett OTR/L 07/28/2018, 11:28 AM

## 2018-07-28 NOTE — Progress Notes (Signed)
Received report on pt.

## 2018-07-28 NOTE — Progress Notes (Signed)
  Speech Language Pathology Treatment: Cognitive-Linquistic  Patient Details Name: Christopher Dominguez MRN: [id number] DOB: 1961/09/12 Today's Date: 07/28/2018 Time: [id number] SLP Time Calculation (min) (ACUTE ONLY): 23 min  Assessment / Plan / Recommendation Clinical Impression  Pt seen for cognitive intervention with sister at bedside. Sister expressed understanding of prior education with SLP and reported ways she attempts to stimulate his cognition and environment including spatial/situational/temporal orientation each day. Upon arrival pt up in chair looking at his phone therefore SLP utilized activities with cell phone to practice problem solving, visual scanning, attention and verbal expression/communication. He needed mod-max cues to initiate tasks requested of him and problem solve how to locate information, manipulate buttons on phone. He perseverates on previous ideas and needs assist for topic maintenance. Verbalizations mixed with appropriate context interspersed with irrelevant information with decreased meaning. Continues to need assist for basic cognitive activities.   HPI HPI: 57 y/o male had a cardiac arrest out of hospital and received 7 rounds of epinephrine to regain a pulse. Tox positive for cocaine. Intubated from 7/4-7/10. Puree diet initated with upgrade to dysphagia 2 on 7/15      SLP Plan  Continue with current plan of care       Recommendations                   Oral Care Recommendations: Oral care BID Follow up Recommendations: Skilled Nursing facility SLP Visit Diagnosis: Cognitive communication deficit (U72.536(R41.841) Plan: Continue with current plan of care       GO                Royce MacadamiaLitaker, Christopher Dominguez 07/28/2018, 1:52 PM  Breck CoonsLisa Dominguez Lonell FaceLitaker M.Ed [employer]-SLP Pager [phone]

## 2018-07-29 DIAGNOSIS — I10 Essential (primary) hypertension: Secondary | ICD-10-CM

## 2018-07-29 MED ORDER — POLYETHYLENE GLYCOL 3350 17 G PO PACK
17.0000 g | PACK | Freq: Every day | ORAL | Status: DC | PRN
  Administered 2018-07-29 – 2018-08-04 (×2): 17 g via ORAL
  Filled 2018-07-29 (×3): qty 1

## 2018-07-29 NOTE — Progress Notes (Signed)
PROGRESS NOTE    Christopher Dominguez  ZOX:[id number] DOB: April 17, 1961 DOA: 06/30/2018 PCP: Patient, No Pcp Per      Brief Narrative:  57 yo with no significant past medical history, originally from [place] admitted to critical care service at [facility] on 7/4 following a cardiac arrest.  Hospitalization complicated by AKI requiring temp HD, now recovering renal function and HD cath was removed. Also complicate by anoxic brain injury w/ encephalopathy and chorea issues. Chorea has now resolved, and memory/ retention/ orientatoin are progressively improving. ECHO showed cardiomyopathy with EF down to 25% which recovered to 55% range in 3 days on repeat echo. Transferred from [facility] to hospitalist service 7/16.  He had a cardiac stress test which was positive by radiology read, but cardiology disagreed with rad interpretation and recommending outpatient follow up. Patient is now awaiting disposition.  The family prefers patient to be discharged to a facility up [place].  Apparently, the patient's sister is from [place], and the patient has a child that lives around [place].  Will likely be appropriate for SNF placement.          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  EF 25 to 30% on admission, improved to 55 to 60% on follow-up echo, without wall motion abnormalities.  Elevated troponin at admission felt to be due to cardiac arrest/CPR not ACS.  Stress test was interpreted by Cardiology to be nonischemic.   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  Still oriented to self and sister only, memory poor, thinks it is 30, he is in [place].  Will only need outpatient neurology follow-up if persistent deficits after rehab. -Consult SLP -Continue dysphagia 2 diet  Acute respiratory failure with hypoxia Extubated 7/10.  S/p course of antibiotics for aspiration  pneumonia.  Acute renal failure Acute renal failure due to cardiac arrest/shock.  Required CRRT for a period of time, temp HD cath was removed on 7/23.  Creatinine trending down, slightly better today. -Repeat BMP in 3-4 days  Hematuria, gross Enlarged prostate Evaluated by urology.  Given very large prostate, recommended avoiding Foley unless residuals greater than 500. -Continue Flomax  Hematochezia, transient Self-limited.  Seen by gastroenterology, supportive care for now.  Heparin was resumed without further bleeding.  Shock liver Resolved.  Anemia Normocytic anemia.  Stable.  Likely a component of iron deficiency and anemia of chronic disease.  Feraheme given 7/22, and second dose ordered 7/25. -EPO per nephrology  Hypertension Well-controlled -Continue amlodipine     DVT prophylaxis: Heparin Code Status: FULL Family Communication: Sister at bedside MDM and disposition Plan: The below labs and imaging reports were reviewed and summarized above.  Medication management as above.  The patient was admitted with cardiac arrest, developed multiorgan failure.  He was living independently before admission, was independent with all daily activities, mentation was normal, cognitive function was normal.  He is now has severe cognitive impairment as a result of anoxic brain injury, is unable to live alone, and has no safe place to be discharged at this time.  Placement is pending.   Consultants:   GI  CCM  Neurology  Urology   Procedures:  R IJ HD catheter CRRT followed by IHD  07/04/18 Study Conclusions  - Left ventricle: The cavity size was normal. Wall thickness was normal. Systolic function was normal. The estimated ejection fraction was in the range of 55% to 60%. Wall  motion was normal; there were no regional wall motion abnormalities. - Aortic root: The aortic root was mildly dilated. - Right ventricle: The cavity size was mildly  dilated.  Impressions:  - Normal LV function; mildly dilated aortic root; mild RVE.  07/01/18 Study Conclusions  - Left ventricle: The cavity size was normal. Wall thickness was increased in a pattern of mild LVH. Systolic function was severely reduced. The estimated ejection fraction was in the range of 25% to 30%. Diffuse hypokinesis. Doppler parameters are consistent with abnormal left ventricular relaxation (grade 1 diastolic dysfunction). - Aortic root: The aortic root was mildly dilated. - Mitral valve: Calcified annulus. - Right ventricle: The cavity size was mildly dilated. Systolic function was severely reduced. - Right atrium: The atrium was mildly dilated.  Impressions:  - Technically difficult; definity used; severe LV dysfunction; mild LVH; mild diastolic dysfunction; enlarged right side with moderate to severe RV dysfunction.  7/5 Final Interpretation: Right: There is no evidence of deep vein thrombosis in the lower extremity. No cystic structure found in the popliteal fossa. Left: There is no evidence of deep vein thrombosis in the lower extremity. However, portions of this examination were limited- see technologist comments above. No cystic structure found in the popliteal fossa.   Antimicrobials:   Zosyn 7/4 >> 7/10    Subjective: He is doing well overnight, no new fever, respiratory distress or confusion.  No focal weakness, vomiting, abdominal pain.  He remains confused, mostly unable to participate in conversation.  Objective: Vitals:   07/28/18 2147 07/29/18 0526 07/29/18 1011 07/29/18 1514  BP: 126/90 124/89 (!) 123/93 126/87  Pulse:  85  100  Resp:  18  18  Temp:  98.6 F (37 C)  98.6 F (37 C)  TempSrc:  Oral  Oral  SpO2:  96%  98%  Weight:  81.2 kg (179 lb)    Height:        Intake/Output Summary (Last 24 hours) at 07/29/2018 1936 Last data filed at 07/29/2018 1400 Gross per 24 hour  Intake 840 ml  Output 300 ml   Net 540 ml   Filed Weights   07/28/18 0557 07/28/18 1649 07/29/18 0526  Weight: 85.3 kg (188 lb) 85.4 kg (188 lb 4.8 oz) 81.2 kg (179 lb)    Examination: General appearance: Male, sitting in a chair, interactive, no acute distress HEENT: Anicteric, conjunctival pink, lids and lashes normal.  No nasal deformity, discharge or epistaxis.  Lips moist, edentulous, no oral lesions, oral mucosa moist, hearing normal.  Dentures in place. Skin: Warm and dry, no jaundice, no suspicious rashes or lesions. Cardiac: Regular rate and rhythm, no murmurs, no lower extremity edema Respiratory: Normal respiratory rate and rhythm, lungs clear without rales or wheezes Abdomen: Abdomen soft, without tenderness to palpation, ascites or hepatosplenomegaly.   MSK: No deformities or effusions. Neuro: Awake and alert, extraocular movements intact, moves all extremities, speech fluent.    Psych: Sensorium intact and responding to questions, conversational.  Oriented to self and sister.  Attention normal, affect pleasant, however when he tries to initiate any sentence, question, or thought, it is nonsensical word salad.   Data Reviewed: I have personally reviewed following labs and imaging studies:  CBC: Recent Labs  Lab 07/25/18 0629  WBC 8.6  HGB 9.6*  HCT 31.8*  MCV 75.2*  PLT 345   Basic Metabolic Panel: Recent Labs  Lab 07/23/18 0519 07/24/18 0710 07/25/18 0629 07/25/18 1219 07/27/18 0814  NA 143 142 141 140 143  K 4.2 4.1 4.1 4.3 4.3  CL 111 112* 109 109 109  CO2 20* 21* 23 23 23   GLUCOSE 106* 102* 109* 106* 106*  BUN 40* 35* 29* 28* 32*  CREATININE 3.60* 2.94* 2.66* 2.58* 2.52*  CALCIUM 9.3 9.5 9.5 9.4 9.6  MG 2.1 1.8 1.9  --   --   PHOS 3.9 3.6 4.1  --   --    GFR: Estimated Creatinine Clearance: 36.7 mL/min (A) (by C-G formula based on SCr of 2.52 mg/dL (H)). Liver Function Tests: Recent Labs  Lab 07/23/18 0519 07/24/18 0710 07/25/18 0629  ALBUMIN 2.8* 2.9* 3.0*   No  results for input(s): LIPASE, AMYLASE in the last 168 hours. No results for input(s): AMMONIA in the last 168 hours. Coagulation Profile: No results for input(s): INR, PROTIME in the last 168 hours. Cardiac Enzymes: No results for input(s): CKTOTAL, CKMB, CKMBINDEX, TROPONINI in the last 168 hours. BNP (last 3 results) No results for input(s): PROBNP in the last 8760 hours. HbA1C: No results for input(s): HGBA1C in the last 72 hours. CBG: Recent Labs  Lab 07/26/18 0801  GLUCAP 97   Lipid Profile: No results for input(s): CHOL, HDL, LDLCALC, TRIG, CHOLHDL, LDLDIRECT in the last 72 hours. Thyroid Function Tests: No results for input(s): TSH, T4TOTAL, FREET4, T3FREE, THYROIDAB in the last 72 hours. Anemia Panel: No results for input(s): VITAMINB12, FOLATE, FERRITIN, TIBC, IRON, RETICCTPCT in the last 72 hours. Urine analysis:    Component Value Date/Time   COLORURINE STRAW (A) 07/15/2018 1640   APPEARANCEUR CLEAR 07/15/2018 1640   LABSPEC 1.006 07/15/2018 1640   PHURINE 9.0 (H) 07/15/2018 1640   GLUCOSEU NEGATIVE 07/15/2018 1640   HGBUR SMALL (A) 07/15/2018 1640   BILIRUBINUR NEGATIVE 07/15/2018 1640   KETONESUR NEGATIVE 07/15/2018 1640   PROTEINUR 30 (A) 07/15/2018 1640   NITRITE NEGATIVE 07/15/2018 1640   LEUKOCYTESUR TRACE (A) 07/15/2018 1640   Sepsis Labs: @LABRCNTIP (procalcitonin:4,lacticacidven:4)  )No results found for this or any previous visit (from the past 240 hour(s)).       Radiology Studies: No results found.      Scheduled Meds: . amLODipine  10 mg Oral Daily  . darbepoetin (ARANESP) injection - NON-DIALYSIS  100 mcg Subcutaneous Q Mon-1800  . feeding supplement (ENSURE ENLIVE)  237 mL Oral BID BM  . ferrous sulfate  325 mg Oral Q breakfast  . heparin injection (subcutaneous)  5,000 Units Subcutaneous Q8H  . tamsulosin  0.4 mg Oral Daily   Continuous Infusions:   LOS: 29 days    Time spent: 25 minutes    Alberteen Samhristopher P Fardeen Steinberger,  MD [facility] 07/29/2018, 7:36 PM     Pager [phone] --- please page though AMION:  [email or web address] Password [email or web address] If 7PM-7AM, please contact night-coverage

## 2018-07-29 NOTE — Progress Notes (Addendum)
  Speech Language Pathology Treatment: Cognitive-Linquistic  Patient Details Name: Christopher KaufmannShelton M Dominguez MRN: [id number] DOB: 1961-12-10 Today's Date: 07/29/2018 Time: 0925-0950 SLP Time Calculation (min) (ACUTE ONLY): 25 min  Assessment / Plan / Recommendation Clinical Impression  Pt eating breakfast tray when SLP entered room, so swallowing strategies utilized with intake for smaller bites/sips d/t decreased sustained attention during intake with pt responding with min verbal cues to SLP redirection/education re: swallowing precautions; pt oriented to self/place and able to recall date with environmental cues from SLP and glasses provided; simple functional problem solving tasks with min-mod verbal cues provided by SLP yielded 50% accuracy with pt perseverating on irrelevant information and required mod verbal cueing d/t decreased sustained attention with all tasks.  Pt noted to pour water in cup with lid on with SLP utilizing min-mod verbal cues to redirect pt; he attempted one other time with SLP intervening and providing visual cues to redirect perseverative behavior; speech is 50-75% accurate during conversational tasks with increased rate and decreased breath support/tangential in nature which contributes to decreased intelligibility.  ST will continue to f/u for cognitive rehab while in acute setting; recommend CIR vs SNF if pt qualifies as a candidate.   HPI HPI: 57 y/o male had a cardiac arrest out of hospital and received 7 rounds of epinephrine to regain a pulse. Tox positive for cocaine. Intubated from 7/4-7/10. Puree diet initated with upgrade to dysphagia 2 on 7/15      SLP Plan  Continue with current plan of care       Recommendations  Liquids provided via: Cup;Straw Compensations: Slow rate;Small sips/bites                General recommendations: Rehab consult Oral Care Recommendations: Oral care BID Follow up Recommendations: Skilled Nursing facility SLP Visit Diagnosis:  Cognitive communication deficit (W09.811(R41.841) Plan: Continue with current plan of care                       Christopher StalkerPat Dominguez, M.S., CCC-SLP 07/29/2018, 10:00 AM

## 2018-07-29 NOTE — Clinical Social Work Note (Addendum)
CSW left voicemail at [facility], emphasizing that I had been trying to reach someone at the facility to discuss referral since Tuesday.  Charlynn CourtSarah Kaleb Sek, CSW [phone]  1:02 pm ALF director has stepped out for lunch but staff member has referral and will see if she has made a decision when she returns.  Charlynn CourtSarah Drevon Plog, CSW [phone]

## 2018-07-29 NOTE — Progress Notes (Signed)
Physical Therapy Treatment Patient Details Name: Christopher KaufmannShelton M Karn MRN: [id number] DOB: 06/16/1961 Today's Date: 07/29/2018    History of Present Illness Pt is a 57 y.o. 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 CT 7/4 showed bilateral occipital edema and R frontoparietal edema, question anoxic brain injury or posterior reversible encephalopathy syndrome; no hemorrhage or midline shift. CT 7/5 showed no acute intracranial abnormality. ETT 7/4-7/10. Acute renal failure; CRRT stopped 7/12. PMH includes depression.    PT Comments    Patient cont to demonstrate slow but steady progress with therapy. Ambulating without AD in hallway however unsafe to walk without assistance due to balance and cognition deficits. Intermittent use of railing for support, min A to stabilize at times with staggering. Feel strongly pt would benefit from sub acute rehab to maximize functional mobility.     Follow Up Recommendations  SNF;Supervision/Assistance - 24 hour     Equipment Recommendations  None recommended by PT    Recommendations for Other Services OT consult;Speech consult;Rehab consult     Precautions / Restrictions Precautions Precautions: Fall Precaution Comments: impulsive Restrictions Weight Bearing Restrictions: No    Mobility  Bed Mobility Overal bed mobility: Needs Assistance Bed Mobility: Supine to Sit     Supine to sit: Min guard Sit to supine: Min guard      Transfers Overall transfer level: Needs assistance Equipment used: 1 person hand held assist Transfers: Sit to/from Stand Sit to Stand: Min guard         General transfer comment: Pt impulsive and requiring close PraxairMin Guard A for safety and quick Min A for standing balanec and safety  Ambulation/Gait Ambulation/Gait assistance: Min Chemical engineerassist Gait Distance (Feet): 150 Feet Assistive device: None Gait Pattern/deviations: Staggering  left;Staggering right Gait velocity: decreasd   General Gait Details: Pt cont to be unsafe walking without assistance, very distracted, several LOB requiring min A to stabilize, intermittent use of railing for support.    Stairs             Wheelchair Mobility    Modified Rankin (Stroke Patients Only)       Balance Overall balance assessment: Needs assistance Sitting-balance support: Feet supported;No upper extremity supported Sitting balance-Leahy Scale: Fair       Standing balance-Leahy Scale: Poor Standing balance comment: Requiring phsyical A for balance and safety                            Cognition Arousal/Alertness: Awake/alert Behavior During Therapy: Impulsive Overall Cognitive Status: Impaired/Different from baseline Area of Impairment: Orientation;Memory;Following commands;Safety/judgement;Problem solving;Attention                 Orientation Level: Place;Situation;Time Current Attention Level: Sustained Memory: Decreased short-term memory Following Commands: Follows one step commands consistently;Follows multi-step commands inconsistently Safety/Judgement: Decreased awareness of safety;Decreased awareness of deficits   Problem Solving: Slow processing;Decreased initiation;Difficulty sequencing;Requires verbal cues;Requires tactile cues General Comments: Mod cues for attention this visit. demonstrating signs of problem solving looking for calander when asked dated, following room numbers back to his room etc      Exercises      General Comments        Pertinent Vitals/Pain Pain Assessment: Faces Faces Pain Scale: No hurt    Home Living                      Prior Function  PT Goals (current goals can now be found in the care plan section) Acute Rehab PT Goals Patient Stated Goal: Sister's goal "for Halford to come home." PT Goal Formulation: With family Time For Goal Achievement: 08/08/18 Potential  to Achieve Goals: Good Progress towards PT goals: Progressing toward goals    Frequency    Min 3X/week      PT Plan Current plan remains appropriate    Co-evaluation              AM-PAC PT "6 Clicks" Daily Activity  Outcome Measure  Difficulty turning over in bed (including adjusting bedclothes, sheets and blankets)?: A Little Difficulty moving from lying on back to sitting on the side of the bed? : A Little Difficulty sitting down on and standing up from a chair with arms (e.g., wheelchair, bedside commode, etc,.)?: A Little Help needed moving to and from a bed to chair (including a wheelchair)?: A Little Help needed walking in hospital room?: A Little Help needed climbing 3-5 steps with a railing? : A Lot 6 Click Score: 17    End of Session Equipment Utilized During Treatment: Gait belt Activity Tolerance: Patient tolerated treatment well Patient left: in chair;with call bell/phone within reach;with chair alarm set;with restraints reapplied Nurse Communication: Mobility status;Precautions PT Visit Diagnosis: Other abnormalities of gait and mobility (R26.89);Other symptoms and signs involving the nervous system (R29.898);Unsteadiness on feet (R26.81)     Time: 1610-9604 PT Time Calculation (min) (ACUTE ONLY): 18 min  Charges:  $Gait Training: 8-22 mins                    Etta Grandchild, PT, DPT Acute Rehab Services Pager: [phone]    Etta Grandchild 07/29/2018, 3:43 PM

## 2018-07-30 NOTE — Progress Notes (Signed)
PROGRESS NOTE    Christopher Dominguez  ZOX:[id number] DOB: [birth year] DOA: 06/30/2018 PCP: Patient, No Pcp Per      Brief Narrative:  57 yo with no significant past medical history, originally from [place] admitted to critical care service at [facility] on 7/4 following a cardiac arrest.  Hospitalization complicated by AKI requiring temp HD, now recovering renal function and HD cath was removed. Also complicate by anoxic brain injury w/ encephalopathy and chorea issues. Chorea has now resolved, and memory/ retention/ orientatoin are progressively improving. ECHO showed cardiomyopathy with EF down to 25% which recovered to 55% range in 3 days on repeat echo. Transferred from [facility] to hospitalist service 7/16.  He had a cardiac stress test which was positive by radiology read, but cardiology disagreed with rad interpretation and recommending outpatient follow up. Patient is now awaiting disposition.  The family prefers patient to be discharged to a facility up [place].  Apparently, the patient's sister is from [place], and the patient has a child that lives around [place].  Will likely be appropriate for SNF placement.          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  EF 25 to 30% on admission, improved to 55 to 60% on follow-up echo, without wall motion abnormalities.  Elevated troponin at admission felt to be due to cardiac arrest/CPR not ACS.  Stress test was interpreted by Cardiology to be nonischemic.   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  Still oriented to self and sister only, memory poor, thinks it is 12, he is in [place].  Will only need outpatient neurology follow-up if persistent deficits after rehab. -Consult SLP -Continue dysphagia 2 diet  Acute respiratory failure with hypoxia Extubated 7/10.  S/p course of antibiotics for aspiration  pneumonia.  Acute renal failure Acute renal failure due to cardiac arrest/shock.  Required CRRT for a period of time, temp HD cath was removed on 7/23.  Trending trending down. -Repeat BMP tomorrow  Hematuria, gross Enlarged prostate Evaluated by urology.  Given very large prostate, recommended avoiding Foley unless residuals greater than 500. -Continue Flomax  Hematochezia, transient Self-limited.  Seen by gastroenterology, supportive care for now.  Heparin was resumed without further bleeding.  Shock liver Resolved.  Anemia Normocytic anemia.  Stable.  Likely a component of iron deficiency and anemia of chronic disease.  Feraheme given 7/22, and second dose ordered 7/25. -EPO per nephrology  Hypertension Well-controlled -Continue amlodipine     DVT prophylaxis: Heparin Code Status: FULL Family Communication:, Niece, sister at the bedside MDM and disposition Plan: Medication management as above.  The patient was admitted with cardiac arrest, developed multiorgan failure.  He was living independently before admission, was independent with all daily activities, mentation was normal, cognitive function was normal.  He is now has severe cognitive impairment as a result of anoxic brain injury, is unable to live alone, and has no safe place to be discharged at this time.  Placement is pending.   Consultants:   GI  CCM  Neurology  Urology   Procedures:  R IJ HD catheter CRRT followed by IHD  07/04/18 Study Conclusions  - Left ventricle: The cavity size was normal. Wall thickness was normal. Systolic function was normal. The estimated ejection fraction was in the range of 55% to 60%. Wall motion was normal; there were no regional wall motion abnormalities. - Aortic root: The aortic  root was mildly dilated. - Right ventricle: The cavity size was mildly dilated.  Impressions:  - Normal LV function; mildly dilated aortic root; mild RVE.  07/01/18 Study  Conclusions  - Left ventricle: The cavity size was normal. Wall thickness was increased in a pattern of mild LVH. Systolic function was severely reduced. The estimated ejection fraction was in the range of 25% to 30%. Diffuse hypokinesis. Doppler parameters are consistent with abnormal left ventricular relaxation (grade 1 diastolic dysfunction). - Aortic root: The aortic root was mildly dilated. - Mitral valve: Calcified annulus. - Right ventricle: The cavity size was mildly dilated. Systolic function was severely reduced. - Right atrium: The atrium was mildly dilated.  Impressions:  - Technically difficult; definity used; severe LV dysfunction; mild LVH; mild diastolic dysfunction; enlarged right side with moderate to severe RV dysfunction.  7/5 Final Interpretation: Right: There is no evidence of deep vein thrombosis in the lower extremity. No cystic structure found in the popliteal fossa. Left: There is no evidence of deep vein thrombosis in the lower extremity. However, portions of this examination were limited- see technologist comments above. No cystic structure found in the popliteal fossa.   Antimicrobials:   Zosyn 7/4 >> 7/10    Subjective: I discussed the case with nursing.  No new fever, respiratory distress or confusion.  Patient has no complaints.  Objective: Vitals:   07/29/18 2203 07/30/18 0528 07/30/18 0930 07/30/18 1402  BP: 118/83 (!) 119/91 133/90 126/89  Pulse: 94 93  94  Resp: 18 18  20   Temp: 98.1 F (36.7 C) 98.6 F (37 C)  98.3 F (36.8 C)  TempSrc: Oral Oral  Oral  SpO2: 99% 99%  99%  Weight:      Height:        Intake/Output Summary (Last 24 hours) at 07/30/2018 1529 Last data filed at 07/30/2018 0900 Gross per 24 hour  Intake 240 ml  Output -  Net 240 ml   Filed Weights   07/28/18 0557 07/28/18 1649 07/29/18 0526  Weight: 85.3 kg (188 lb) 85.4 kg (188 lb 4.8 oz) 81.2 kg (179 lb)    Examination: General  appearance: Adult male, sitting up in a chair, interactive, no acute distress HEENT: Anicteric, conjunctival pink, lids and lashes normal.  No nasal deformity, discharge or epistaxis.  Lips moist, edentulous, no oral lesions, oral mucosa moist, hearing normal.  Dentures in place. Skin: Warm and dry, no jaundice, no suspicious rashes or lesions. Cardiac: Regular rate and rhythm, no murmurs, no lower extremity edema Respiratory: Aspiratory rate and rhythm, lungs clear without rales or wheezes Abdomen: Abdomen soft, without tenderness to palpation, ascites or hepatosplenomegaly.   MSK: No deformities or effusions. Neuro: Awake and alert, extraocular movements intact, moves all extremities, speech fluent.    Psych: Sensorium intact and responding to questions, conversational.  Oriented to self and sister.  Attention normal, affect pleasant, however when he tries to initiate any sentence, question, or thought, it is nonsensical word salad.   Data Reviewed: I have personally reviewed following labs and imaging studies:  CBC: Recent Labs  Lab 07/25/18 0629  WBC 8.6  HGB 9.6*  HCT 31.8*  MCV 75.2*  PLT 345   Basic Metabolic Panel: Recent Labs  Lab 07/24/18 0710 07/25/18 0629 07/25/18 1219 07/27/18 0814  NA 142 141 140 143  K 4.1 4.1 4.3 4.3  CL 112* 109 109 109  CO2 21* 23 23 23   GLUCOSE 102* 109* 106* 106*  BUN 35* 29* 28*  32*  CREATININE 2.94* 2.66* 2.58* 2.52*  CALCIUM 9.5 9.5 9.4 9.6  MG 1.8 1.9  --   --   PHOS 3.6 4.1  --   --    GFR: Estimated Creatinine Clearance: 36.7 mL/min (A) (by C-G formula based on SCr of 2.52 mg/dL (H)). Liver Function Tests: Recent Labs  Lab 07/24/18 0710 07/25/18 0629  ALBUMIN 2.9* 3.0*   No results for input(s): LIPASE, AMYLASE in the last 168 hours. No results for input(s): AMMONIA in the last 168 hours. Coagulation Profile: No results for input(s): INR, PROTIME in the last 168 hours. Cardiac Enzymes: No results for input(s): CKTOTAL,  CKMB, CKMBINDEX, TROPONINI in the last 168 hours. BNP (last 3 results) No results for input(s): PROBNP in the last 8760 hours. HbA1C: No results for input(s): HGBA1C in the last 72 hours. CBG: Recent Labs  Lab 07/26/18 0801  GLUCAP 97   Lipid Profile: No results for input(s): CHOL, HDL, LDLCALC, TRIG, CHOLHDL, LDLDIRECT in the last 72 hours. Thyroid Function Tests: No results for input(s): TSH, T4TOTAL, FREET4, T3FREE, THYROIDAB in the last 72 hours. Anemia Panel: No results for input(s): VITAMINB12, FOLATE, FERRITIN, TIBC, IRON, RETICCTPCT in the last 72 hours. Urine analysis:    Component Value Date/Time   COLORURINE STRAW (A) 07/15/2018 1640   APPEARANCEUR CLEAR 07/15/2018 1640   LABSPEC 1.006 07/15/2018 1640   PHURINE 9.0 (H) 07/15/2018 1640   GLUCOSEU NEGATIVE 07/15/2018 1640   HGBUR SMALL (A) 07/15/2018 1640   BILIRUBINUR NEGATIVE 07/15/2018 1640   KETONESUR NEGATIVE 07/15/2018 1640   PROTEINUR 30 (A) 07/15/2018 1640   NITRITE NEGATIVE 07/15/2018 1640   LEUKOCYTESUR TRACE (A) 07/15/2018 1640   Sepsis Labs: @LABRCNTIP (procalcitonin:4,lacticacidven:4)  )No results found for this or any previous visit (from the past 240 hour(s)).       Radiology Studies: No results found.      Scheduled Meds: . amLODipine  10 mg Oral Daily  . darbepoetin (ARANESP) injection - NON-DIALYSIS  100 mcg Subcutaneous Q Mon-1800  . feeding supplement (ENSURE ENLIVE)  237 mL Oral BID BM  . ferrous sulfate  325 mg Oral Q breakfast  . heparin injection (subcutaneous)  5,000 Units Subcutaneous Q8H  . tamsulosin  0.4 mg Oral Daily   Continuous Infusions:   LOS: 30 days    Time spent: 15 minutes    Alberteen Sam, MD [facility] 07/30/2018, 3:29 PM     Pager [phone] --- please page though AMION:  [email or web address] Password [email or web address] If 7PM-7AM, please contact night-coverage

## 2018-07-30 NOTE — Progress Notes (Signed)
CSW attempted to follow up with possibly admission of pt to [name] [place].  Left voice message for Malachi BondsGloria to contact CSW with information concerning admission for pt.  CSW will continue to follow.  Budd Palmerara Miku Udall LCSWA [phone]

## 2018-07-31 LAB — CBC
HCT: 36.4 % — ABNORMAL LOW (ref 39.0–52.0)
Hemoglobin: 10.7 g/dL — ABNORMAL LOW (ref 13.0–17.0)
MCH: 22.2 pg — ABNORMAL LOW (ref 26.0–34.0)
MCHC: 29.4 g/dL — ABNORMAL LOW (ref 30.0–36.0)
MCV: 75.5 fL — AB (ref 78.0–100.0)
PLATELETS: 232 10*3/uL (ref 150–400)
RBC: 4.82 MIL/uL (ref 4.22–5.81)
RDW: 19.9 % — AB (ref 11.5–15.5)
WBC: 7 10*3/uL (ref 4.0–10.5)

## 2018-07-31 LAB — BASIC METABOLIC PANEL
Anion gap: 9 (ref 5–15)
BUN: 25 mg/dL — AB (ref 6–20)
CALCIUM: 9.4 mg/dL (ref 8.9–10.3)
CO2: 23 mmol/L (ref 22–32)
CREATININE: 2.01 mg/dL — AB (ref 0.61–1.24)
Chloride: 106 mmol/L (ref 98–111)
GFR calc non Af Amer: 35 mL/min — ABNORMAL LOW (ref >60)
GFR, EST AFRICAN AMERICAN: 40 mL/min — AB (ref >60)
Glucose, Bld: 106 mg/dL — ABNORMAL HIGH (ref 70–99)
Potassium: 4 mmol/L (ref 3.5–5.1)
SODIUM: 138 mmol/L (ref 135–145)

## 2018-07-31 NOTE — Progress Notes (Signed)
Patient does not have an IV and was refusing a new one. A page was sent.

## 2018-07-31 NOTE — Progress Notes (Signed)
PROGRESS NOTE    Christopher KLIEBERT  Dominguez:[id number] DOB: 1961-05-16 DOA: 06/30/2018 PCP: [place], No Pcp Per      Brief Narrative:  57 yo with no significant past medical history, originally from [place] admitted to critical care service at [facility] on 7/4 following a cardiac arrest.  Hospitalization complicated by AKI requiring temp HD, now recovering renal function and HD cath was removed. Also complicate by anoxic brain injury w/ encephalopathy and chorea issues. Chorea has now resolved, and memory/ retention/ orientatoin are progressively improving. ECHO showed cardiomyopathy with EF down to 25% which recovered to 55% range in 3 days on repeat echo. Transferred from [place] to hospitalist service 7/16.  He had a cardiac stress test which was positive by radiology read, but cardiology disagreed with rad interpretation and recommending outpatient follow up. [place] Dominguez is now awaiting disposition.  The family prefers [place] to be discharged to a facility up [place].  Apparently, the Christopher Dominguez's sister is from [place], and the [place] Dominguez has a child that lives around [place].  Will likely be appropriate for SNF placement.          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  EF 25 to 30% on admission, improved to 55 to 60% on follow-up echo, without wall motion abnormalities.  Elevated troponin at admission felt to be due to cardiac arrest/CPR not ACS.  Stress test was interpreted by Cardiology to be nonischemic.   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  Still oriented to self and sister only, memory poor, thinks it is 50, he is in [place].  Will only need outpatient neurology follow-up if persistent deficits after rehab. -Consult SLP -Continue dysphagia 2 diet  Acute respiratory failure with hypoxia Extubated 7/10.  S/p course of antibiotics for aspiration  pneumonia.  Acute renal failure Acute renal failure due to cardiac arrest/shock.  Required CRRT for a period of time, temp HD cath was removed on 7/23.  Creatinine continues to trend down.  Today 2.0 mg/dL.  Baseline in 2017 was 1.2.   -Trend Cr weekly  Hematuria, gross Enlarged prostate Evaluated by urology.  Given very large prostate, recommended avoiding Foley unless residuals greater than 500. -Continue Flomax  Hematochezia, transient Self-limited.  Seen by gastroenterology, supportive care for now.  Heparin was resumed without further bleeding.  Shock liver Resolved.  Anemia Normocytic anemia.  Stable.  Likely a component of iron deficiency and anemia of chronic disease.  Feraheme given 7/22, and second dose ordered 7/25.  Hgb improved today. -CBC repeat in 4-8 weeks  Hypertension Well-controlled -Continue amlodipine     DVT prophylaxis: Heparin Code Status: FULL Family Communication:, None present MDM and disposition Plan: Low labs were personally reviewed and summarized above.  Medication management as above.  The [place] Dominguez was admitted with cardiac arrest, developed multiorgan failure.  He was living independently before admission, was independent with all daily activities, mentation was normal, cognitive function was normal.  He is now has severe cognitive impairment as a result of anoxic brain injury, is unable to live alone, and has no safe place to be discharged at this time.  Placement is pending.   Consultants:   GI  CCM  Neurology  Urology   Procedures:  R IJ HD catheter CRRT followed by IHD  07/04/18 Study Conclusions  - Left ventricle: The cavity size was normal. Wall thickness was normal. Systolic function was normal. The estimated  ejection fraction was in the range of 55% to 60%. Wall motion was normal; there were no regional wall motion abnormalities. - Aortic root: The aortic root was mildly dilated. - Right ventricle: The cavity  size was mildly dilated.  Impressions:  - Normal LV function; mildly dilated aortic root; mild RVE.  07/01/18 Study Conclusions  - Left ventricle: The cavity size was normal. Wall thickness was increased in a pattern of mild LVH. Systolic function was severely reduced. The estimated ejection fraction was in the range of 25% to 30%. Diffuse hypokinesis. Doppler parameters are consistent with abnormal left ventricular relaxation (grade 1 diastolic dysfunction). - Aortic root: The aortic root was mildly dilated. - Mitral valve: Calcified annulus. - Right ventricle: The cavity size was mildly dilated. Systolic function was severely reduced. - Right atrium: The atrium was mildly dilated.  Impressions:  - Technically difficult; definity used; severe LV dysfunction; mild LVH; mild diastolic dysfunction; enlarged right side with moderate to severe RV dysfunction.  7/5 Final Interpretation: Right: There is no evidence of deep vein thrombosis in the lower extremity. No cystic structure found in the popliteal fossa. Left: There is no evidence of deep vein thrombosis in the lower extremity. However, portions of this examination were limited- see technologist comments above. No cystic structure found in the popliteal fossa.   Antimicrobials:   Zosyn 7/4 >> 7/10    Subjective: Discussed the case with nursing.  No Christopher fever, respiratory distress, confusion.  Christopher Dominguez has no complaints.  Objective: Vitals:   07/30/18 1402 07/31/18 0520 07/31/18 0901 07/31/18 1427  BP: 126/89 114/83 116/84 124/86  Pulse: 94 98  90  Resp: 20 20  20   Temp: 98.3 F (36.8 C) 98.5 F (36.9 C)  98.1 F (36.7 C)  TempSrc: Oral Oral  Oral  SpO2: 99% 98%  100%  Weight:      Height:       No intake or output data in the 24 hours ending 07/31/18 1622 Filed Weights   07/28/18 0557 07/28/18 1649 07/29/18 0526  Weight: 85.3 kg (188 lb) 85.4 kg (188 lb 4.8 oz) 81.2 kg (179 lb)     Examination: General appearance: Adult male, sitting up in a chair, interactive, no acute distress HEENT: Anicteric, conjunctival pink, lids and lashes normal.  No nasal deformity, discharge or epistaxis.  Lips moist, edentulous, no oral lesions, oral mucosa moist, hearing normal.  Dentures in place. Skin: Warm and dry, no jaundice, no suspicious rashes or lesions Cardiac: Regular rate and rhythm, no murmurs, no lower extremity edema Respiratory: Normal respiratory rate and rhythm, lungs clear without rales or wheezes Abdomen: Soft without tenderness to palpation, ascites, or hepatospleno megaly MSK: No deformities or effusions. Neuro: Awake and alert, extraocular movements intact, moves all extremities, speech fluent.    Psych: Sensorium intact and responding to questions, conversational, but actually cannot make any sense.  Unable to answer simple questions.  Not able to state where he is, but able to recognize family.   Data Reviewed: I have personally reviewed following labs and imaging studies:  CBC: Recent Labs  Lab 07/25/18 0629 07/31/18 0700  WBC 8.6 7.0  HGB 9.6* 10.7*  HCT 31.8* 36.4*  MCV 75.2* 75.5*  PLT 345 232   Basic Metabolic Panel: Recent Labs  Lab 07/25/18 0629 07/25/18 1219 07/27/18 0814 07/31/18 0700  NA 141 140 143 138  K 4.1 4.3 4.3 4.0  CL 109 109 109 106  CO2 23 23 23 23   GLUCOSE 109* 106*  106* 106*  BUN 29* 28* 32* 25*  CREATININE 2.66* 2.58* 2.52* 2.01*  CALCIUM 9.5 9.4 9.6 9.4  MG 1.9  --   --   --   PHOS 4.1  --   --   --    GFR: Estimated Creatinine Clearance: 46 mL/min (A) (by C-G formula based on SCr of 2.01 mg/dL (H)). Liver Function Tests: Recent Labs  Lab 07/25/18 0629  ALBUMIN 3.0*   No results for input(s): LIPASE, AMYLASE in the last 168 hours. No results for input(s): AMMONIA in the last 168 hours. Coagulation Profile: No results for input(s): INR, PROTIME in the last 168 hours. Cardiac Enzymes: No results for  input(s): CKTOTAL, CKMB, CKMBINDEX, TROPONINI in the last 168 hours. BNP (last 3 results) No results for input(s): PROBNP in the last 8760 hours. HbA1C: No results for input(s): HGBA1C in the last 72 hours. CBG: Recent Labs  Lab 07/26/18 0801  GLUCAP 97   Lipid Profile: No results for input(s): CHOL, HDL, LDLCALC, TRIG, CHOLHDL, LDLDIRECT in the last 72 hours. Thyroid Function Tests: No results for input(s): TSH, T4TOTAL, FREET4, T3FREE, THYROIDAB in the last 72 hours. Anemia Panel: No results for input(s): VITAMINB12, FOLATE, FERRITIN, TIBC, IRON, RETICCTPCT in the last 72 hours. Urine analysis:    Component Value Date/Time   COLORURINE STRAW (A) 07/15/2018 1640   APPEARANCEUR CLEAR 07/15/2018 1640   LABSPEC 1.006 07/15/2018 1640   PHURINE 9.0 (H) 07/15/2018 1640   GLUCOSEU NEGATIVE 07/15/2018 1640   HGBUR SMALL (A) 07/15/2018 1640   BILIRUBINUR NEGATIVE 07/15/2018 1640   KETONESUR NEGATIVE 07/15/2018 1640   PROTEINUR 30 (A) 07/15/2018 1640   NITRITE NEGATIVE 07/15/2018 1640   LEUKOCYTESUR TRACE (A) 07/15/2018 1640   Sepsis Labs: @LABRCNTIP (procalcitonin:4,lacticacidven:4)  )No results found for this or any previous visit (from the past 240 hour(s)).       Radiology Studies: No results found.      Scheduled Meds: . amLODipine  10 mg Oral Daily  . darbepoetin (ARANESP) injection - NON-DIALYSIS  100 mcg Subcutaneous Q Mon-1800  . feeding supplement (ENSURE ENLIVE)  237 mL Oral BID BM  . ferrous sulfate  325 mg Oral Q breakfast  . heparin injection (subcutaneous)  5,000 Units Subcutaneous Q8H  . tamsulosin  0.4 mg Oral Daily   Continuous Infusions:   LOS: 31 days    Time spent: 25 minutes   Alberteen Samhristopher P Jaaziel Peatross, MD [facility] 07/31/2018, 4:22 PM     Pager [phone] --- please page though AMION:  [email or web address] Password [email or web address] If 7PM-7AM, please contact night-coverage

## 2018-08-01 MED ORDER — ENOXAPARIN SODIUM 40 MG/0.4ML ~~LOC~~ SOLN
40.0000 mg | SUBCUTANEOUS | Status: DC
  Administered 2018-08-01 – 2018-08-11 (×11): 40 mg via SUBCUTANEOUS
  Filled 2018-08-01 (×10): qty 0.4

## 2018-08-01 NOTE — Clinical Social Work Note (Addendum)
Discussed case with MD this morning. CSW called [facility] in [place] again this morning to discuss how substance abuse will not be an issue due to anoxic brain injury. Intake coordinator stated that they are still unable to offer a bed because typically individuals with anoxic brain injuries have behavior issues that require one-on-one care and they are unable to provide that. CSW received voicemail from patient's niece, Charlean SanfilippoFantasia Mazor [phone]) asking to call and discuss placement in [place]. Patient's son had wanted him placed in [name] close to his home. CSW attempted calling son to get permission to call her back. No answer and voicemail not set up. Will try again later.  Charlynn CourtSarah Mirabelle Cyphers, CSW [phone]  12:08 pm Received call from patient's son. He plans to tour a SNF in [name] at 1:30 today with his sister, Elmarie Shileyiffany. He put TIffany on the phone. Answered questions about insurance coverage, switched [place] to [place], and transport to the facility. CSW did not advise plane ride back to [place] due to orientation status. Patient's son will call back around 2:00 today to provide facility name if he approved of it and fax number so CSW can send referral. CSW notified son's sister that patient's niece had left voicemail. They do not want CSW to call back and stated all discharge related information should be relayed only to son to avoid confusion.  Charlynn CourtSarah Berania Peedin, CSW [phone]  2:32 pm Received call from patient's son. [facility] to [facility], [facility] coordinator at [facility] [place], [place].  Charlynn CourtSarah Cleven Jansma, CSW [phone]

## 2018-08-01 NOTE — Progress Notes (Signed)
Late entry; Patient's sister came to nurses station to request we call security to have the patients ex- girlfriend escorted out. Nurse attempted to make contact with patients son who is next of kin as patient is not oriented, no answer. Associate ProfessorAssistant director notified security of situation, visitor asked to leave without incident.

## 2018-08-01 NOTE — Progress Notes (Signed)
Physical Therapy Treatment Patient Details Name: Christopher Dominguez MRN: [id number] DOB: 1961-10-13 Today's Date: 08/01/2018    History of Present Illness Pt is a 58 y.o. 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 CT 7/4 showed bilateral occipital edema and R frontoparietal edema, question anoxic brain injury or posterior reversible encephalopathy syndrome; no hemorrhage or midline shift. CT 7/5 showed no acute intracranial abnormality. ETT 7/4-7/10. Acute renal failure; CRRT stopped 7/12. PMH includes depression.    PT Comments    Pt tolerated increased ambulation distance and required less assist this session. He continues to demonstrate cognitive deficits and decreased safety awareness. Pt with wide BOS and poor balance during ambulation. No overt LOB noted. He would continue to benefit from SNF level therapies to maximize functional independence and safety with mobility.    Follow Up Recommendations  SNF;Supervision/Assistance - 24 hour     Equipment Recommendations  None recommended by PT    Recommendations for Other Services OT consult;Speech consult;Rehab consult     Precautions / Restrictions Precautions Precautions: Fall Precaution Comments: impulsive Restrictions Weight Bearing Restrictions: No    Mobility  Bed Mobility Overal bed mobility: Needs Assistance Bed Mobility: Supine to Sit     Supine to sit: Supervision Sit to supine: Supervision   General bed mobility comments: Supervision for safety. No cues required.  Transfers Overall transfer level: Needs assistance Equipment used: None Transfers: Sit to/from Stand Sit to Stand: Min guard;Min assist         General transfer comment: sit<>stand performed 10x. Cues required for each movement. First sit>stand required min A to steady  Ambulation/Gait Ambulation/Gait assistance: Min guard   Assistive device: None Gait  Pattern/deviations: Staggering left;Wide base of support Gait velocity: decreasd   General Gait Details: Pt with very wide BOS and staggering L during ambulation. Easily distracted and looking into other pt rooms requiring ques to stay on task. No overt LOB noted. Close min guard for safety.   Stairs             Wheelchair Mobility    Modified Rankin (Stroke Patients Only)       Balance Overall balance assessment: Needs assistance Sitting-balance support: Feet supported;No upper extremity supported Sitting balance-Leahy Scale: Fair       Standing balance-Leahy Scale: Fair Standing balance comment: able to ambulate with min guard for safety, no UE support required.                            Cognition Arousal/Alertness: Awake/alert Behavior During Therapy: Impulsive Overall Cognitive Status: Impaired/Different from baseline Area of Impairment: Orientation;Memory;Following commands;Safety/judgement;Problem solving;Attention                 Orientation Level: Place;Situation;Time Current Attention Level: Sustained Memory: Decreased short-term memory Following Commands: Follows one step commands consistently;Follows multi-step commands inconsistently Safety/Judgement: Decreased awareness of safety;Decreased awareness of deficits   Problem Solving: Slow processing;Decreased initiation;Difficulty sequencing;Requires verbal cues;Requires tactile cues General Comments: Pt requires step by step commands, unable to follow multistep commands at this time. A&O x1, believed it was november 2018 and he was in a correctional facility.       Exercises      General Comments        Pertinent Vitals/Pain Pain Assessment: No/denies pain    Home Living  Prior Function            PT Goals (current goals can now be found in the care plan section) Acute Rehab PT Goals Patient Stated Goal: Sister's goal "for [name] to come  home." PT Goal Formulation: With family Time For Goal Achievement: 08/08/18 Potential to Achieve Goals: Good Progress towards PT goals: Progressing toward goals    Frequency    Min 3X/week      PT Plan Current plan remains appropriate    Co-evaluation              AM-PAC PT "6 Clicks" Daily Activity  Outcome Measure  Difficulty turning over in bed (including adjusting bedclothes, sheets and blankets)?: A Little Difficulty moving from lying on back to sitting on the side of the bed? : A Little Difficulty sitting down on and standing up from a chair with arms (e.g., wheelchair, bedside commode, etc,.)?: A Little Help needed moving to and from a bed to chair (including a wheelchair)?: A Little Help needed walking in hospital room?: A Little Help needed climbing 3-5 steps with a railing? : A Lot 6 Click Score: 17    End of Session Equipment Utilized During Treatment: Gait belt Activity Tolerance: Patient tolerated treatment well Patient left: with call bell/phone within reach;in bed;with bed alarm set Nurse Communication: Mobility status PT Visit Diagnosis: Other abnormalities of gait and mobility (R26.89);Other symptoms and signs involving the nervous system (R29.898);Unsteadiness on feet (R26.81)     Time: 1442-1500 PT Time Calculation (min) (ACUTE ONLY): 18 min  Charges:  $Gait Training: 8-22 mins                     Kallie LocksHannah Jenascia Bumpass, [place]A Pager [phone] Acute Rehab  Sheral ApleyHannah E Smantha Boakye 08/01/2018, 3:10 PM

## 2018-08-01 NOTE — Progress Notes (Signed)
PROGRESS NOTE    Christopher Dominguez  UJW:[id number] DOB: 07-Apr-1961 DOA: 06/30/2018 PCP: Patient, No Pcp Per      Brief Narrative:  57 yo with no significant past medical history, originally from [place] admitted to critical care service at [facility] on 7/4 following a cardiac arrest.  Hospitalization complicated by AKI requiring temp HD, now recovering renal function and HD cath was removed. Also complicate by anoxic brain injury w/ encephalopathy and chorea issues. Chorea has now resolved, and memory/ retention/ orientatoin are progressively improving. ECHO showed cardiomyopathy with EF down to 25% which recovered to 55% range in 3 days on repeat echo. Transferred from [facility] to hospitalist service 7/16.  He had a cardiac stress test which was positive by radiology read, but cardiology disagreed with rad interpretation and recommending outpatient follow up. Patient is now awaiting disposition.  The family prefers patient to be discharged to a facility up [place].  Apparently, the patient's sister is from [place], and the patient has a child that lives around [place].  Will likely be appropriate for SNF placement.          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  EF 25 to 30% on admission, improved to 55 to 60% on follow-up echo, without wall motion abnormalities.  Elevated troponin at admission felt to be due to cardiac arrest/CPR not ACS.  He was evaluated by Cardiology and they felt his stress test was nonischemic.   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  Unclear if this is improving, or represents permanent cognitive impairment from anoxic brain injury.  He had a post-arrest chorea that has resolved.  Still oriented to self and sister only, memory poor, thinks it is 9, he is in [place].  He was evaluated by neurology, will only need outpatient  neurology follow-up if persistent deficits after rehab. -Consult SLP  Acute respiratory failure with hypoxia Extubated 7/10.  S/p course of antibiotics for aspiration pneumonia.  Acute renal failure Acute renal failure due to cardiac arrest/shock.  Required CRRT for a period of time, temp HD cath was removed on 7/23.  Creatinine continues to trend down.  Most recently 2.0 mg/dL.  Baseline in 2017 was 1.2.   -Trend Cr weekly  Hematuria, gross Enlarged prostate Evaluated by urology.  Given very large prostate, recommended avoiding Foley unless residuals greater than 500. -Check PVR daily -Continue Flomax  Hematochezia, transient Self-limited.  Seen by gastroenterology, supportive care for now.  Heparin was resumed without further bleeding.  Shock liver Resolved.  Anemia Normocytic anemia.  Stable.  Likely a component of iron deficiency and anemia of chronic disease.  Feraheme given 7/22, and second dose ordered 7/25.  Hgb improved today. -CBC repeat in 4-8 weeks  Hypertension Well-controlled -Continue amlodipine        DVT prophylaxis: Lovenox Code Status: FULL Family Communication:, Sister at bedisde MDM and disposition Plan: The below labs and imaging were personally reviewed and summarized above.  Medication management as above.  The patient was admitted with cardiac arrest, developed multiorgan failure.  He was living independently before admission, was independent with all daily activities, mentation was normal, cognitive function was normal.  He is now has severe cognitive impairment as a result of anoxic brain injury, is unable to live alone, and has no safe place to be discharged at this time.  Placement is pending.   Consultants:   GI  CCM  Neurology  Urology   Procedures:  R IJ HD catheter CRRT followed by IHD  07/04/18 Study Conclusions  - Left ventricle: The cavity size was normal. Wall thickness was normal. Systolic function was normal. The  estimated ejection fraction was in the range of 55% to 60%. Wall motion was normal; there were no regional wall motion abnormalities. - Aortic root: The aortic root was mildly dilated. - Right ventricle: The cavity size was mildly dilated.  Impressions:  - Normal LV function; mildly dilated aortic root; mild RVE.  07/01/18 Study Conclusions  - Left ventricle: The cavity size was normal. Wall thickness was increased in a pattern of mild LVH. Systolic function was severely reduced. The estimated ejection fraction was in the range of 25% to 30%. Diffuse hypokinesis. Doppler parameters are consistent with abnormal left ventricular relaxation (grade 1 diastolic dysfunction). - Aortic root: The aortic root was mildly dilated. - Mitral valve: Calcified annulus. - Right ventricle: The cavity size was mildly dilated. Systolic function was severely reduced. - Right atrium: The atrium was mildly dilated.  Impressions:  - Technically difficult; definity used; severe LV dysfunction; mild LVH; mild diastolic dysfunction; enlarged right side with moderate to severe RV dysfunction.  7/5 Final Interpretation: Right: There is no evidence of deep vein thrombosis in the lower extremity. No cystic structure found in the popliteal fossa. Left: There is no evidence of deep vein thrombosis in the lower extremity. However, portions of this examination were limited- see technologist comments above. No cystic structure found in the popliteal fossa.   Antimicrobials:   Zosyn 7/4 >> 7/10    Subjective: Discussed case with nursing, no new fever, respiratory distress, confusion, diarrhea, bladder complaints.  He has no complaints.  Objective: Vitals:   07/31/18 1427 07/31/18 2037 08/01/18 0509 08/01/18 1031  BP: 124/86 119/85 121/84 119/90  Pulse: 90 83 80 89  Resp: 20 17 17    Temp: 98.1 F (36.7 C) 98.2 F (36.8 C) 98.3 F (36.8 C)   TempSrc: Oral Oral Oral   SpO2:  100% 98% 97% 98%  Weight:      Height:        Intake/Output Summary (Last 24 hours) at 08/01/2018 1345 Last data filed at 08/01/2018 1100 Gross per 24 hour  Intake 220 ml  Output -  Net 220 ml   Filed Weights   07/28/18 0557 07/28/18 1649 07/29/18 0526  Weight: 85.3 kg (188 lb) 85.4 kg (188 lb 4.8 oz) 81.2 kg (179 lb)    Examination: General appearance: Pleasant adult male, sitting in a chair, interactive, no acute distress HEENT: Anicteric, conjunctival pink, lids and lashes normal.  No nasal deformity, discharge or epistaxis.  Lips moist, edentulous, no oral lesions, oral mucosa moist, hearing normal.  Dentures in place. Skin: Warm and dry, no jaundice, no suspicious rashes or lesions Cardiac: RRR, no murmurs, no lower extremity edema Respiratory: Normal respiratory rate and rhythm, lungs clear without rales or wheezes Abdomen: Soft without tenderness to palpation, ascites, or hepatospleno megaly MSK: No deformities or effusions. Neuro: Awake and alert, extraocular movements intact, moves all extremities, speech fluent.    Psych: Sensorium intact and responding to questions, conversational, but actually cannot make any sense.  Unable to answer simple questions.  Not able to state where he is, but able to recognize family.   Data Reviewed: I have personally reviewed following labs and imaging studies:  CBC: Recent Labs  Lab 07/31/18 0700  WBC 7.0  HGB 10.7*  HCT 36.4*  MCV 75.5*  PLT 232   Basic Metabolic Panel: Recent Labs  Lab 07/27/18 0814 07/31/18 0700  NA 143 138  K 4.3 4.0  CL 109 106  CO2 23 23  GLUCOSE 106* 106*  BUN 32* 25*  CREATININE 2.52* 2.01*  CALCIUM 9.6 9.4   GFR: Estimated Creatinine Clearance: 46 mL/min (A) (by C-G formula based on SCr of 2.01 mg/dL (H)). Liver Function Tests: No results for input(s): AST, ALT, ALKPHOS, BILITOT, PROT, ALBUMIN in the last 168 hours. No results for input(s): LIPASE, AMYLASE in the last 168 hours. No results for  input(s): AMMONIA in the last 168 hours. Coagulation Profile: No results for input(s): INR, PROTIME in the last 168 hours. Cardiac Enzymes: No results for input(s): CKTOTAL, CKMB, CKMBINDEX, TROPONINI in the last 168 hours. BNP (last 3 results) No results for input(s): PROBNP in the last 8760 hours. HbA1C: No results for input(s): HGBA1C in the last 72 hours. CBG: Recent Labs  Lab 07/26/18 0801  GLUCAP 97   Lipid Profile: No results for input(s): CHOL, HDL, LDLCALC, TRIG, CHOLHDL, LDLDIRECT in the last 72 hours. Thyroid Function Tests: No results for input(s): TSH, T4TOTAL, FREET4, T3FREE, THYROIDAB in the last 72 hours. Anemia Panel: No results for input(s): VITAMINB12, FOLATE, FERRITIN, TIBC, IRON, RETICCTPCT in the last 72 hours. Urine analysis:    Component Value Date/Time   COLORURINE STRAW (A) 07/15/2018 1640   APPEARANCEUR CLEAR 07/15/2018 1640   LABSPEC 1.006 07/15/2018 1640   PHURINE 9.0 (H) 07/15/2018 1640   GLUCOSEU NEGATIVE 07/15/2018 1640   HGBUR SMALL (A) 07/15/2018 1640   BILIRUBINUR NEGATIVE 07/15/2018 1640   KETONESUR NEGATIVE 07/15/2018 1640   PROTEINUR 30 (A) 07/15/2018 1640   NITRITE NEGATIVE 07/15/2018 1640   LEUKOCYTESUR TRACE (A) 07/15/2018 1640   Sepsis Labs: @LABRCNTIP (procalcitonin:4,lacticacidven:4)  )No results found for this or any previous visit (from the past 240 hour(s)).       Radiology Studies: No results found.      Scheduled Meds: . amLODipine  10 mg Oral Daily  . darbepoetin (ARANESP) injection - NON-DIALYSIS  100 mcg Subcutaneous Q Mon-1800  . feeding supplement (ENSURE ENLIVE)  237 mL Oral BID BM  . ferrous sulfate  325 mg Oral Q breakfast  . heparin injection (subcutaneous)  5,000 Units Subcutaneous Q8H  . tamsulosin  0.4 mg Oral Daily   Continuous Infusions:   LOS: 32 days    Time spent: 15 minutes   Alberteen Sam, MD [facility] 08/01/2018, 1:45 PM     Pager [phone] --- please page  though AMION:  [email or web address] Password [email or web address] If 7PM-7AM, please contact night-coverage

## 2018-08-01 NOTE — NC FL2 (Addendum)
[place] MEDICAID FL2 LEVEL OF CARE SCREENING TOOL     IDENTIFICATION  Patient Name: Christopher KaufmannShelton M Pennino Birthdate: [birth year] Sex: male Admission Date (Current Location): 06/30/2018  [facility] [place]   Facility and Address:  [facility], [address], [place], [place] [address]      Provider Number: [id number]  Attending Physician Name and Address:  Alberteen Samanford, Christopher P, *  Relative Name and Phone Number:       Current Level of Care: Hospital Recommended Level of Care: Skilled Nursing Facility Prior Approval Number:    Date Approved/Denied:   PASRR Number:    Discharge Plan: SNF    Current Diagnoses: Patient Active Problem List   Diagnosis Date Noted  . Debility   . Gross hematuria   . Lower GI bleed   . AKI (acute kidney injury) (HCC)   . 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 Encounter for orogastric (OG) tube placement   . Acute respiratory failure with hypoxemia (HCC)   . Anoxic brain injury (HCC)   . Cardiac arrest (HCC) 06/30/2018    Orientation RESPIRATION BLADDER Height & Weight     Self  Normal Continent Weight: 179 lb (81.2 kg) Height:  6\' 2"  (188 cm)  BEHAVIORAL SYMPTOMS/MOOD NEUROLOGICAL BOWEL NUTRITION STATUS  (None) (None) Incontinent Diet(Regular)  AMBULATORY STATUS COMMUNICATION OF NEEDS Skin   Limited Assist Verbally Skin abrasions, Bruising                       Personal Care Assistance Level of Assistance  Bathing, Feeding, Dressing Bathing Assistance: Limited assistance Feeding assistance: Limited assistance Dressing Assistance: Limited assistance     Functional Limitations Info  Sight, Hearing, Speech Sight Info: Adequate Hearing Info: Adequate Speech Info: Adequate    SPECIAL CARE FACTORS FREQUENCY  PT (By licensed PT), OT (By licensed OT)     PT Frequency: 5 x week OT Frequency: 5 x week    Contractures Contractures Info: Not present    Additional Factors  Info  Code Status, Allergies Code Status Info: Full Code Allergies Info: NKDA           Current Medications (08/01/2018):  This is the current hospital active medication list Current Facility-Administered Medications  Medication Dose Route Frequency Provider Last Rate Last Dose  . amLODipine (NORVASC) tablet 10 mg  10 mg Oral Daily Zigmund DanielPowell, A Caldwell Jr., MD   10 mg at 08/01/18 1036  . Darbepoetin Alfa (ARANESP) injection 100 mcg  100 mcg Subcutaneous Q Mon-1800 Elvis CoilWebb, Martin, MD   100 mcg at 07/25/18 1844  . enoxaparin (LOVENOX) injection 40 mg  40 mg Subcutaneous Q24H Danford, Christopher P, MD      . feeding supplement (ENSURE ENLIVE) (ENSURE ENLIVE) liquid 237 mL  237 mL Oral BID BM Delano MetzSchertz, Robert, MD   237 mL at 08/01/18 1325  . ferrous sulfate tablet 325 mg  325 mg Oral Q breakfast Zigmund DanielPowell, A Caldwell Jr., MD   325 mg at 08/01/18 0603  . lip balm (CARMEX) ointment   Topical PRN Reyes IvanAljishi, Wael Z, MD   1 application at 07/10/18 2253  . polyethylene glycol (MIRALAX / GLYCOLAX) packet 17 g  17 g Oral Daily PRN Alberteen Samanford, Christopher P, MD   17 g at 07/29/18 1102  . tamsulosin (FLOMAX) capsule 0.4 mg  0.4 mg Oral Daily Ollis, Brandi L, NP   0.4 mg at 08/01/18 1036     Discharge Medications:  Please see discharge summary for a list of discharge medications.  Relevant Imaging Results:  Relevant Lab Results:   Additional Information SS#: [id number]  Margarito Liner, LCSW

## 2018-08-02 NOTE — Progress Notes (Signed)
Nutrition Follow-up  DOCUMENTATION CODES:   Not applicable  INTERVENTION:  Continue Ensure Enlive po BID, each supplement provides 350 kcal and 20 grams of protein  Continue Double Protein Portions  NUTRITION DIAGNOSIS:   Inadequate oral intake related to lethargy/confusion as evidenced by meal completion < 25%. -resolved  GOAL:   Patient will meet greater than or equal to 90% of their needs -progressing  MONITOR:   Supplement acceptance, Diet advancement, PO intake, I & O's  ASSESSMENT:   Pt with unknown PMH admitted after OOH arrest.   Ate 100% of lunch, receiving double protein portions. Also drinking Ensure BID.    Medications reviewed and include:  Iron Labs reviewed  Diet Order:   Diet Order           Diet regular Room service appropriate? Yes; Fluid consistency: Thin  Diet effective now          EDUCATION NEEDS:   No education needs have been identified at this time  Skin:  Skin Assessment: Reviewed RN Assessment  Last BM:  07/31/2018  Height:   Ht Readings from Last 1 Encounters:  07/28/18 6\' 2"  (1.88 m)    Weight:   Wt Readings from Last 1 Encounters:  07/29/18 179 lb (81.2 kg)    Ideal Body Weight:  78.1 kg  BMI:  Body mass index is 22.98 kg/m.  Estimated Nutritional Needs:   Kcal:  2200-2400  Protein:  105-125 grams  Fluid:  2 L/day    Christopher AnoWilliam M. Clovis Warwick, MS, RD LDN Inpatient Clinical Dietitian Pager [phone]

## 2018-08-02 NOTE — Clinical Social Work Note (Addendum)
[facility]ic[facility].  Charlynn CourtSarah Avabella Wailes, CSW [phone]  3:37 pm Left another voicemail for SNF admissions coordinator.  Charlynn CourtSarah Greogry Goodwyn, CSW [phone]

## 2018-08-02 NOTE — Progress Notes (Signed)
Occupational Therapy Treatment Patient Details Name: Christopher KaufmannShelton M Dominguez MRN: [id number] DOB: 07-Mar-1961 Today's Date: 08/02/2018    History of present illness Pt is a 57 y.o. 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 CT 7/4 showed bilateral occipital edema and R frontoparietal edema, question anoxic brain injury or posterior reversible encephalopathy syndrome; no hemorrhage or midline shift. CT 7/5 showed no acute intracranial abnormality. ETT 7/4-7/10. Acute renal failure; CRRT stopped 7/12. PMH includes depression.   OT comments  Pt still needs min assist for functional mobility as well as max instructional cueing for short term and working memory. He was not oriented to place, time, or situation.  He also demonstrated long term memory deficits, not remembering that two of his sisters and his grandmother are deceased.  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  Still recommend long term cognitive and physical rehab at SNF level.  Will continue to follow in acute care.    Follow Up Recommendations  SNF;Supervision/Assistance - 24 hour    Equipment Recommendations  Other (comment)(defer to next venue)       Precautions / Restrictions Precautions Precautions: Fall Precaution Comments: impulsive Restrictions Weight Bearing Restrictions: No       Mobility Bed Mobility Overal bed mobility: Needs Assistance Bed Mobility: Supine to Sit     Supine to sit: Supervision        Transfers Overall transfer level: Needs assistance Equipment used: None Transfers: Sit to/from Stand Sit to Stand: Min assist              Balance Overall balance assessment: Needs assistance Sitting-balance support: Feet supported;No  upper extremity supported Sitting balance-Leahy Scale: Good       Standing balance-Leahy Scale: Fair Standing balance comment: Pt with occasional LOB with mobility requiring min assist to regain.                           ADL either performed or assessed with clinical judgement   ADL                                       Functional mobility during ADLs: Minimal assistance General ADL Comments: Pt still needing  min assist for balance with functional mobility in the hallway.  He also continues to be disoriented with time, place, and situation.  When asked about his family he does not remember that his grandmother is deceased or that he has two sisters that are deceased as well.  [name] only living sister Christopher ShinerGrace was present for session as well.  Discussed progress and pt's current limitations in short and long term memory.  Placed two sheets in his room for memory, one with the current place and the other stating his reason for being here.  Will need SNF for follow-up rehab with pt's sister planning to move in with him at discharge from rehab.                 Cognition Arousal/Alertness: Awake/alert   Overall Cognitive Status: Impaired/Different from baseline Area of Impairment: Orientation;Memory;Problem solving;Awareness  Orientation Level: Place;Time;Situation;Disoriented to Current Attention Level: Sustained Memory: Decreased short-term memory Following Commands: Follows one step commands consistently Safety/Judgement: Decreased awareness of deficits;Decreased awareness of safety   Problem Solving: Slow processing;Difficulty sequencing General Comments: Pt stated it was November and that he was in [place].  Pt unable to state any of three word recall initially and only 1/3 on second attempt.  When asked earlier who he went outside with, he stated a guy up the hall that clean the floors, even though it was his sister that he went out  with.  He did not remember.  At times he goes off topic when questioned.                     Pertinent Vitals/ Pain       Pain Assessment: No/denies pain         Frequency  Min 2X/week        Progress Toward Goals  OT Goals(current goals can now be found in the care plan section)  Progress towards OT goals: Progressing toward goals     Plan Discharge plan remains appropriate       AM-PAC PT "6 Clicks" Daily Activity     Outcome Measure     Help from another person taking care of personal grooming?: A Little Help from another person toileting, which includes using toliet, bedpan, or urinal?: A Little Help from another person bathing (including washing, rinsing, drying)?: A Little Help from another person to put on and taking off regular upper body clothing?: A Little Help from another person to put on and taking off regular lower body clothing?: A Little 6 Click Score: 15    End of Session Equipment Utilized During Treatment: Gait belt  OT Visit Diagnosis: Unsteadiness on feet (R26.81);Other abnormalities of gait and mobility (R26.89);Muscle weakness (generalized) (M62.81);Cognitive communication deficit (R41.841)   Activity Tolerance Patient tolerated treatment well   Patient Left with call bell/phone within reach;with family/visitor present;in chair           Time: 1536-1600 OT Time Calculation (min): 24 min  Charges: OT General Charges $OT Visit: 1 Visit OT Treatments $Cognitive Skills Development: 23-37 mins   Christopher Dominguez OTR/L 08/02/2018, 4:34 PM

## 2018-08-02 NOTE — Plan of Care (Signed)
Patient left the floor with sister escorting him wheelchair.

## 2018-08-02 NOTE — Progress Notes (Signed)
PROGRESS NOTE    DE JAWORSKI  YQM:[id number] DOB: 1961-07-30 DOA: 06/30/2018 PCP: Patient, No Pcp Per      Brief Narrative:  57 yo with no significant past medical history, originally from [place] admitted to critical care service at [facility] on 7/4 following a cardiac arrest.  Hospitalization complicated by AKI requiring temp HD, now recovering renal function and HD cath was removed. Also complicate by anoxic brain injury w/ encephalopathy and chorea issues. Chorea has now resolved, and memory/ retention/ orientatoin are progressively improving. ECHO showed cardiomyopathy with EF down to 25% which recovered to 55% range in 3 days on repeat echo. Transferred from [facility] to hospitalist service 7/16.  He had a cardiac stress test which was positive by radiology read, but cardiology disagreed with rad interpretation and recommending outpatient follow up. Patient is now awaiting disposition.  The family prefers patient to be discharged to a facility up [place].  Apparently, the patient's sister is from [place], and the patient has a child that lives around [place].  Will likely be appropriate for SNF placement.          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  EF 25 to 30% on admission, improved to 55 to 60% on follow-up echo, without wall motion abnormalities.  Elevated troponin at admission felt to be due to cardiac arrest/CPR not ACS.  He was evaluated by Cardiology and they felt his stress test was nonischemic.   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  Unclear if this is improving, or represents permanent cognitive impairment from anoxic brain injury.  He had a post-arrest chorea that has resolved.  Still oriented to self and sister only, memory poor, thinks it is 12, he is in [place].  He was evaluated by neurology, will only need outpatient  neurology follow-up if persistent deficits after rehab. -Consult SLP  Acute respiratory failure with hypoxia Extubated 7/10.  S/p course of antibiotics for aspiration pneumonia.  Acute renal failure Acute renal failure due to cardiac arrest/shock.  Required CRRT for a period of time, temp HD cath was removed on 7/23.  Creatinine continues to trend down.  Most recently 2.0 mg/dL.  Baseline in 2017 was 1.2.   -Trend Cr weekly until new baseline, next on Aug 12  Hematuria, gross Enlarged prostate Evaluated by urology.  Given very large prostate, recommended avoiding Foley unless residuals greater than 500. -Continue Flomax  Hematochezia, transient Self-limited.  Seen by gastroenterology, supportive care for now.  Heparin was resumed without further bleeding.  Shock liver Resolved.  Anemia Normocytic anemia.  Stable.  Likely a component of iron deficiency and anemia of chronic disease.  Feraheme given 7/22, and second dose ordered 7/25.  Hgb improved today. -CBC repeat in 4-8 weeks  Hypertension Well-controlled -Continue amlodipine        DVT prophylaxis: Lovenox Code Status: FULL Family Communication:, Sister at the bedside MDM and disposition Plan: Medication management as above  The patient was admitted with cardiac arrest, developed multiorgan failure.  He was living independently before admission, was independent with all daily activities, mentation was normal, cognitive function was normal.  He is now has severe cognitive impairment as a result of anoxic brain injury, is unable to live alone, and has no safe place to be discharged at this time.  Placement is pending.  With regard to drug use, the patient is speculated to have used at  least once before admission (evidenced by UDS), but at present, given his cognitive state, he is unable to obtain or use, and has minimal risk of relapse.  He is pleasant of demeanor, without any evidence of withdrawal, agitation or  aggressive behaviors of any kind.       Consultants:   GI  CCM  Neurology  Urology   Procedures:  R IJ HD catheter CRRT followed by IHD  07/04/18 Study Conclusions  - Left ventricle: The cavity size was normal. Wall thickness was normal. Systolic function was normal. The estimated ejection fraction was in the range of 55% to 60%. Wall motion was normal; there were no regional wall motion abnormalities. - Aortic root: The aortic root was mildly dilated. - Right ventricle: The cavity size was mildly dilated.  Impressions:  - Normal LV function; mildly dilated aortic root; mild RVE.  07/01/18 Study Conclusions  - Left ventricle: The cavity size was normal. Wall thickness was increased in a pattern of mild LVH. Systolic function was severely reduced. The estimated ejection fraction was in the range of 25% to 30%. Diffuse hypokinesis. Doppler parameters are consistent with abnormal left ventricular relaxation (grade 1 diastolic dysfunction). - Aortic root: The aortic root was mildly dilated. - Mitral valve: Calcified annulus. - Right ventricle: The cavity size was mildly dilated. Systolic function was severely reduced. - Right atrium: The atrium was mildly dilated.  Impressions:  - Technically difficult; definity used; severe LV dysfunction; mild LVH; mild diastolic dysfunction; enlarged right side with moderate to severe RV dysfunction.  7/5 Final Interpretation: Right: There is no evidence of deep vein thrombosis in the lower extremity. No cystic structure found in the popliteal fossa. Left: There is no evidence of deep vein thrombosis in the lower extremity. However, portions of this examination were limited- see technologist comments above. No cystic structure found in the popliteal fossa.   Antimicrobials:   Zosyn 7/4 >> 7/10    Subjective: Discussed case with nursing.  No new fever, respiratory distress, confusion,  diarrhea, bladder complaints, fall.  Patient himself has no complaints.  He is pleasant.  Objective: Vitals:   08/02/18 0517 08/02/18 0943 08/02/18 1004 08/02/18 1615  BP: 108/87 96/75 116/84 105/67  Pulse: 77 83 78 99  Resp: 18 18  18   Temp: 97.6 F (36.4 C)   98.7 F (37.1 C)  TempSrc: Oral     SpO2: 93% 95%  97%  Weight:      Height:        Intake/Output Summary (Last 24 hours) at 08/02/2018 1643 Last data filed at 08/02/2018 1600 Gross per 24 hour  Intake 480 ml  Output -  Net 480 ml   Filed Weights   07/28/18 0557 07/28/18 1649 07/29/18 0526  Weight: 85.3 kg (188 lb) 85.4 kg (188 lb 4.8 oz) 81.2 kg (179 lb)    Examination: General appearance: Pleasant adult male, watching television, interactive, no acute distress HEENT: Anicteric, conjunctival pink, lids and lashes normal.  No nasal deformity, discharge or epistaxis.  Lips moist, edentulous, no oral lesions, oral mucosa moist, hearing normal.  Dentures in place. Skin: Warm and dry, no jaundice, no suspicious rashes or lesions Cardiac: RRR, no murmurs, no lower extremity edema  respiratory: Torrey rate and rhythm, lungs clear without rales or wheezes Abdomen: Soft without tenderness to palpation, ascites, or hepatospleno megaly MSK: No deformities or effusions. Neuro: Awake and alert, extraocular movements intact, moves all extremities, speech fluent.    Psych: Sensorium intact and responding  to questions, conversational, but actually cannot make any sense.  Unable to answer simple questions.  Not able to state where he is, but able to recognize family.   Data Reviewed: I have personally reviewed following labs and imaging studies:  CBC: Recent Labs  Lab 07/31/18 0700  WBC 7.0  HGB 10.7*  HCT 36.4*  MCV 75.5*  PLT 232   Basic Metabolic Panel: Recent Labs  Lab 07/27/18 0814 07/31/18 0700  NA 143 138  K 4.3 4.0  CL 109 106  CO2 23 23  GLUCOSE 106* 106*  BUN 32* 25*  CREATININE 2.52* 2.01*  CALCIUM 9.6  9.4   GFR: Estimated Creatinine Clearance: 46 mL/min (A) (by C-G formula based on SCr of 2.01 mg/dL (H)). Liver Function Tests: No results for input(s): AST, ALT, ALKPHOS, BILITOT, PROT, ALBUMIN in the last 168 hours. No results for input(s): LIPASE, AMYLASE in the last 168 hours. No results for input(s): AMMONIA in the last 168 hours. Coagulation Profile: No results for input(s): INR, PROTIME in the last 168 hours. Cardiac Enzymes: No results for input(s): CKTOTAL, CKMB, CKMBINDEX, TROPONINI in the last 168 hours. BNP (last 3 results) No results for input(s): PROBNP in the last 8760 hours. HbA1C: No results for input(s): HGBA1C in the last 72 hours. CBG: No results for input(s): GLUCAP in the last 168 hours. Lipid Profile: No results for input(s): CHOL, HDL, LDLCALC, TRIG, CHOLHDL, LDLDIRECT in the last 72 hours. Thyroid Function Tests: No results for input(s): TSH, T4TOTAL, FREET4, T3FREE, THYROIDAB in the last 72 hours. Anemia Panel: No results for input(s): VITAMINB12, FOLATE, FERRITIN, TIBC, IRON, RETICCTPCT in the last 72 hours. Urine analysis:    Component Value Date/Time   COLORURINE STRAW (A) 07/15/2018 1640   APPEARANCEUR CLEAR 07/15/2018 1640   LABSPEC 1.006 07/15/2018 1640   PHURINE 9.0 (H) 07/15/2018 1640   GLUCOSEU NEGATIVE 07/15/2018 1640   HGBUR SMALL (A) 07/15/2018 1640   BILIRUBINUR NEGATIVE 07/15/2018 1640   KETONESUR NEGATIVE 07/15/2018 1640   PROTEINUR 30 (A) 07/15/2018 1640   NITRITE NEGATIVE 07/15/2018 1640   LEUKOCYTESUR TRACE (A) 07/15/2018 1640   Sepsis Labs: @LABRCNTIP (procalcitonin:4,lacticacidven:4)  )No results found for this or any previous visit (from the past 240 hour(s)).       Radiology Studies: No results found.      Scheduled Meds: . amLODipine  10 mg Oral Daily  . darbepoetin (ARANESP) injection - NON-DIALYSIS  100 mcg Subcutaneous Q Mon-1800  . enoxaparin (LOVENOX) injection  40 mg Subcutaneous Q24H  . feeding  supplement (ENSURE ENLIVE)  237 mL Oral BID BM  . ferrous sulfate  325 mg Oral Q breakfast  . tamsulosin  0.4 mg Oral Daily   Continuous Infusions:   LOS: 33 days    Time spent: 15 minutes   Alberteen Sam, MD [facility] 08/02/2018, 4:43 PM     Pager [phone] --- please page though AMION:  [email or web address] Password [email or web address] If 7PM-7AM, please contact night-coverage

## 2018-08-02 NOTE — Progress Notes (Signed)
SLP Cancellation Note  Patient Details Name: Christopher KaufmannShelton M Dominguez MRN: [id number] DOB: 11/25/1961   Cancelled treatment:       Reason Eval/Treat Not Completed: Other (comment)   New orders received and acknowledged however pt is already on ST caseload and is receiving services. SLP attempted treatment this day, however pt was out of room. Will attempt at next available time.    Rissie Sculley 08/02/2018, 2:04 PM

## 2018-08-03 LAB — CBC
HEMATOCRIT: 37 % — AB (ref 39.0–52.0)
Hemoglobin: 11 g/dL — ABNORMAL LOW (ref 13.0–17.0)
MCH: 22.5 pg — ABNORMAL LOW (ref 26.0–34.0)
MCHC: 29.7 g/dL — AB (ref 30.0–36.0)
MCV: 75.7 fL — AB (ref 78.0–100.0)
PLATELETS: 178 10*3/uL (ref 150–400)
RBC: 4.89 MIL/uL (ref 4.22–5.81)
RDW: 19.2 % — AB (ref 11.5–15.5)
WBC: 5.7 10*3/uL (ref 4.0–10.5)

## 2018-08-03 NOTE — Progress Notes (Signed)
[facility]  PROGRESS NOTE  Christopher Dominguez ZOX:[id number] DOB: April 05, 1961 DOA: 06/30/2018 PCP: Patient, No Pcp Per   Brief HPI:   57 year old male with   no significant medical history but originally from [place] admitted to critical care service at [facility] on 06/30/2018 following a cardiac arrest.  Hospitalization complicated by AKI requiring temporary hemodialysis now recovering renal function and HD cath was removed.  Also complicated by anoxic brain injury with encephalopathy and coria.  Could you resolved.  With memory/attention/orientation slowly improving.  Echo showed cardiomyopathy with EF down to 25% which recovered to.  55% range in 3 days on repeat echo. He was transferred from [facility] to hospitalist service on 07/12/2018.  Patient had a cardiac stress test which was read positive by radiology but cardiology disagreed with radiology interpretation and recommended outpatient follow-up.  Patient is awaiting disposition to skilled facility in [place].    Subjective   *Patient seen and examined, working with speech therapy for cognition.   Assessment/Plan:     1. 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  Echo showed EF 25 to 30% on admission which improved to 35 to 60% on follow-up echo without any wall motion abnormalities.  Troponin elevated at time of admission likely from cardiac arrest/CPR but not acute coronary syndrome.  Cardiology saw the patient and recommended that stress test result was nonischemic.  Recommended outpatient follow-up. 2. 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  Speech therapy working with patient for cognitive impairment.  Postarrest coria has resolved.  He was seen by neurology and recommended outpatient neurology follow-up if persistent deficits after rehab. 3. Acute respiratory failure with hypoxia-extubated 07/06/2018.  Status post course of  antibiotics for aspiration pneumonia. 4. Acute kidney injury-due to cardiac arrest/shock.  Required CRRT for of time.  Temporary HD cath removed on 7/23.  Creatinine continues to improve.  Today creatinine is 2.01 5. Enlarged prostate/gross hematuria-seen by urology.  Given large prostate recommended avoiding Foley unless residuals greater than 500.  Continue Flomax. 6. Transient hematochezia-self-limited, seen by GI.  Supportive care for now.  Heparin was resumed without further bleeding.   7. Shock liver- resolved. 8. Normocytic anemia-stable likely component of iron deficiency and anemia of chronic disease.  Feraheme given on 07/18/2018.  Second dose ordered on 07/21/2018.  Hemoglobin was 11.0 yesterday.  Check CBC periodically.     DVT prophylaxis: Lovenox  Code Status: Full code  Family Communication: No family at bedside  Disposition Plan: likely home when medically ready for discharge   Consultants:  GI  CCM  Neurology  Urology  Procedures:  Right IJ hemodialysis catheter  CRRT  07/04/18 Echo Study Conclusions   Impressions:  - Normal LV function; mildly dilated aortic root; mild RVE.  07/01/18 Echo Study Conclusions    Impressions:  - Technically difficult; definity used; severe LV dysfunction; mild LVH; mild diastolic dysfunction; enlarged right side with moderate to severe RV dysfunction.  7/5 Final Interpretation: Right: There is no evidence of deep vein thrombosis in the lower extremity. No cystic structure found in the popliteal fossa. Left: There is no evidence of deep vein thrombosis in the lower extremity. However, portions of this examination were limited- see technologist comments above. No cystic structure found in the popliteal fossa.    Antibiotics:   Anti-infectives (From admission, onward)   Start     Dose/Rate Route Frequency Ordered Stop   07/06/18 1200  piperacillin-tazobactam (ZOSYN) IVPB 3.375 g  Note to Pharmacy:   Please stop after today   3.375 g 100 mL/hr over 30 Minutes Intravenous Every 6 hours 07/06/18 1015 07/06/18 1932   07/02/18 1800  piperacillin-tazobactam (ZOSYN) IVPB 3.375 g  Status:  Discontinued     3.375 g 12.5 mL/hr over 240 Minutes Intravenous Every 6 hours 07/02/18 1309 07/02/18 1310   07/02/18 1800  piperacillin-tazobactam (ZOSYN) IVPB 3.375 g  Status:  Discontinued     3.375 g 100 mL/hr over 30 Minutes Intravenous Every 6 hours 07/02/18 1311 07/06/18 1015   07/02/18 1200  piperacillin-tazobactam (ZOSYN) IVPB 2.25 g  Status:  Discontinued     2.25 g 100 mL/hr over 30 Minutes Intravenous Every 6 hours 07/02/18 0728 07/02/18 1309   07/01/18 0100  piperacillin-tazobactam (ZOSYN) IVPB 3.375 g  Status:  Discontinued     3.375 g 12.5 mL/hr over 240 Minutes Intravenous Every 8 hours 06/30/18 1812 07/02/18 0728   06/30/18 1815  piperacillin-tazobactam (ZOSYN) IVPB 3.375 g     3.375 g 100 mL/hr over 30 Minutes Intravenous  Once 06/30/18 1804 06/30/18 1945       Objective   Vitals:   08/02/18 2144 08/03/18 0510 08/03/18 0959 08/03/18 1523  BP: 103/73 108/84 113/80 109/78  Pulse: 89 79  93  Resp: 18 18  18   Temp: 98.3 F (36.8 C) 98 F (36.7 C)  97.6 F (36.4 C)  TempSrc: Oral Oral  Oral  SpO2: 96% 96%  98%  Weight:      Height:       No intake or output data in the 24 hours ending 08/03/18 1656 Filed Weights   07/28/18 0557 07/28/18 1649 07/29/18 0526  Weight: 85.3 kg (188 lb) 85.4 kg (188 lb 4.8 oz) 81.2 kg (179 lb)     Physical Examination:    General: Appears in no acute distress  Cardiovascular: S1-S2, regular  Respiratory: Clear to auscultation bilaterally  Abdomen: Soft, nontender, no organomegaly  Extremities: No edema in the lower extremities  Neurologic: Alert oriented x2     Data Reviewed: I have personally reviewed following labs and imaging studies  CBG: No results for input(s): GLUCAP in the last 168 hours.  CBC: Recent Labs  Lab  07/31/18 0700 08/03/18 0504  WBC 7.0 5.7  HGB 10.7* 11.0*  HCT 36.4* 37.0*  MCV 75.5* 75.7*  PLT 232 178    Basic Metabolic Panel: Recent Labs  Lab 07/31/18 0700  NA 138  K 4.0  CL 106  CO2 23  GLUCOSE 106*  BUN 25*  CREATININE 2.01*  CALCIUM 9.4    No results found for this or any previous visit (from the past 240 hour(s)).   Liver Function Tests: No results for input(s): AST, ALT, ALKPHOS, BILITOT, PROT, ALBUMIN in the last 168 hours. No results for input(s): LIPASE, AMYLASE in the last 168 hours. No results for input(s): AMMONIA in the last 168 hours.  Cardiac Enzymes: No results for input(s): CKTOTAL, CKMB, CKMBINDEX, TROPONINI in the last 168 hours. BNP (last 3 results) No results for input(s): BNP in the last 8760 hours.  ProBNP (last 3 results) No results for input(s): PROBNP in the last 8760 hours.    Studies: No results found.  Scheduled Meds: . amLODipine  10 mg Oral Daily  . darbepoetin (ARANESP) injection - NON-DIALYSIS  100 mcg Subcutaneous Q Mon-1800  . enoxaparin (LOVENOX) injection  40 mg Subcutaneous Q24H  . feeding supplement (ENSURE ENLIVE)  237 mL Oral BID BM  . ferrous sulfate  325 mg Oral Q breakfast  . tamsulosin  0.4 mg Oral Daily      Time spent: 20 min  Meredeth Ide   [facility] Pager [phone]. If 7PM-7AM, please contact night-coverage at [email or web address], Office  [phone]  password [email or web address]  08/03/2018, 4:56 PM  LOS: 34 days

## 2018-08-03 NOTE — Progress Notes (Signed)
Physical Therapy Treatment Patient Details Name: Christopher Dominguez MRN: [id number] DOB: Aug 27, 1961 Today's Date: 08/03/2018    History of Present Illness Pt is a 57 y.o. 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 CT 7/4 showed bilateral occipital edema and R frontoparietal edema, question anoxic brain injury or posterior reversible encephalopathy syndrome; no hemorrhage or midline shift. CT 7/5 showed no acute intracranial abnormality. ETT 7/4-7/10. Acute renal failure; CRRT stopped 7/12. PMH includes depression.    PT Comments    Patient progressing with standing endurance, balance and working on problem solving/strategy game.  Continue to feel he will benefit from SNF level rehab at d/c.  PT to follow acutely.   Follow Up Recommendations  SNF;Supervision/Assistance - 24 hour     Equipment Recommendations  None recommended by PT    Recommendations for Other Services       Precautions / Restrictions Precautions Precautions: Fall Precaution Comments: impulsive    Mobility  Bed Mobility           Sit to supine: Supervision      Transfers Overall transfer level: Needs assistance Equipment used: None Transfers: Sit to/from Stand Sit to Stand: Min assist         General transfer comment: initially min A ue to not having on shoes, and standing to don pants despite cues to sit for safety; once shoes on several times sit to and from stand with S for safety  Ambulation/Gait Ambulation/Gait assistance: Min guard Gait Distance (Feet): 150 Feet Assistive device: None Gait Pattern/deviations: Step-through pattern;Wide base of support     General Gait Details: wide BOS and moving impulsively throughout ambulation to dayroom working on wayfinding in elevator to 4th floor with cues and occasional assist for safety   Stairs             Wheelchair Mobility    Modified Rankin (Stroke Patients  Only)       Balance Overall balance assessment: Needs assistance   Sitting balance-Leahy Scale: Good Sitting balance - Comments: leaning over to tie shoes for extended time and bending down to pick up paper from floor     Standing balance-Leahy Scale: Good Standing balance comment: standing intermittently for playing checkers with limited endurance reaching to lean on table and sit when able despite cues to remain standing                            Cognition Arousal/Alertness: Awake/alert Behavior During Therapy: Impulsive Overall Cognitive Status: Impaired/Different from baseline Area of Impairment: Orientation;Memory;Problem solving;Awareness                 Orientation Level: Place;Time;Situation;Disoriented to Current Attention Level: Sustained Memory: Decreased short-term memory Following Commands: Follows one step commands consistently Safety/Judgement: Decreased awareness of deficits;Decreased awareness of safety     General Comments: playing game of checkers with good strategy and frequent reminders of his color.  Denied he had been here except since yesterday he came for a class; able to recall he needed to return to the fifth floor to return to his room, but could not recall numbner and found different room      Exercises      General Comments General comments (skin integrity, edema, etc.): sister present; standing intermittently to play checkers with occasional cues to remind him of his color, good strategy and adherence to rules of the game otherwise  Pertinent Vitals/Pain Pain Assessment: No/denies pain    Home Living                      Prior Function            PT Goals (current goals can now be found in the care plan section) Progress towards PT goals: Progressing toward goals    Frequency    Min 3X/week      PT Plan Current plan remains appropriate    Co-evaluation              AM-PAC PT "6 Clicks"  Daily Activity  Outcome Measure  Difficulty turning over in bed (including adjusting bedclothes, sheets and blankets)?: A Little Difficulty moving from lying on back to sitting on the side of the bed? : A Little Difficulty sitting down on and standing up from a chair with arms (e.g., wheelchair, bedside commode, etc,.)?: A Little Help needed moving to and from a bed to chair (including a wheelchair)?: A Little Help needed walking in hospital room?: A Little Help needed climbing 3-5 steps with a railing? : A Lot 6 Click Score: 17    End of Session   Activity Tolerance: Patient tolerated treatment well Patient left: in bed;with call bell/phone within reach;with family/visitor present   PT Visit Diagnosis: Other abnormalities of gait and mobility (R26.89);Other symptoms and signs involving the nervous system (R29.898);Unsteadiness on feet (R26.81)     Time: [id number] PT Time Calculation (min) (ACUTE ONLY): 31 min  Charges:  $Gait Training: 8-22 mins $Therapeutic Activity: 8-22 mins                     Cienega Springsyndi Elizabethann Lackey, [place] [phone] 08/03/2018    Elray Mcgregorynthia Haileigh Pitz 08/03/2018, 5:15 PM

## 2018-08-03 NOTE — Progress Notes (Signed)
  Speech Language Pathology Treatment: Cognitive-Linquistic  Patient Details Name: Christopher KaufmannShelton M Dominguez MRN: [id number] DOB: August 22, 1961 Today's Date: 08/03/2018 Time: [id number] SLP Time Calculation (min) (ACUTE ONLY): 17 min  Assessment / Plan / Recommendation Clinical Impression  Pt was seen for skilled ST targeting cognitive goals.  Upon arrival, pt was asleep in bed but awakened easily to voice and turning lights on.  Pt was oriented to self only and exhibited language of confusion.  Pt needed max to total assist to reorient to place, date, and situation despite multiple environmental aids being posted around his room.  SLP posted a calendar on pt's wall to facilitate improved orientation to date, signs already posted for orientation to place and situation.  SLP facilitated the session with a basic card game to address attention, problem solving and memory.  Pt needed between mod and max assist to complete task in a minimally distracting environment due to distractibility as well as decreased working memory of task rules and procedures.  Pt was left in bed with call bell within reach, asking about a cat he had earlier in the day.  Pt adamant that he had a cat with him since being admitted to the hospital despite therapist's redirection.  Continue per current plan of care.    HPI HPI: 57 y/o male had a cardiac arrest out of hospital and received 7 rounds of epinephrine to regain a pulse. Tox positive for cocaine. Intubated from 7/4-7/10. Puree diet initated with upgrade to dysphagia 2 on 7/15      SLP Plan  Continue with current plan of care       Recommendations                   Follow up Recommendations: Skilled Nursing facility SLP Visit Diagnosis: Cognitive communication deficit (Z60.109(R41.841) Plan: Continue with current plan of care       GO                Christopher Dominguez, Melanee Spryicole L 08/03/2018, 11:15 AM

## 2018-08-03 NOTE — Clinical Social Work Note (Addendum)
CSW spoke with Tobi BastosAnna, admissions coordinator at [facility] in [place], [place]. She stated they currently do not have any bed availability but are having sister facility ([facility] in [place], [place] is able to take him until they can transfer him to their facility. She stated the facility is reviewing the referral and we should have an update in the next couple of hours.   Charlynn CourtSarah Matt Delpizzo, CSW [phone]  12:57 pm CSW left voicemail for Tobi Bastosnna.  Charlynn CourtSarah Kannon Baum, CSW [phone]

## 2018-08-04 MED ORDER — TUBERCULIN PPD 5 UNIT/0.1ML ID SOLN
5.0000 [IU] | Freq: Once | INTRADERMAL | Status: AC
  Administered 2018-08-04: 5 [IU] via INTRADERMAL
  Filled 2018-08-04: qty 0.1

## 2018-08-04 NOTE — Progress Notes (Addendum)
4:30pm-Patient' son returned CSW 's call. He reported understanding that we need to continue to search for a facility in [place]. CSW requested TB test for patient from MD (requirement of Assisted Living Facilties).   2:43pm-Anna returned CSW's call. She stated that they are unable to [name]. CSW attempted to alert son that we would continue to find placement in [place], but no voicemail available. CSW also sent son a [employer] text. CSW contacted [facility] but they do not have male beds. Shanda BumpsJessica from [facility] will review referral and visit patient.   1:41pm-CSW left message for admissions coordinator, Tobi Bastosnna, at [facility] to check on status of referral .  Cristobal Goldmannadia Sequoyah Ramone LCSW [phone]

## 2018-08-04 NOTE — Progress Notes (Signed)
[facility]  PROGRESS NOTE  Christopher KaufmannShelton M Dominguez EXB:[id number] DOB: [birth year] DOA: 06/30/2018 PCP: Patient, No Pcp Per   Brief HPI:   57 year old male with   no significant medical history but [place] from [place] admitted to critical care service [facility] 06/30/2018 following a cardiac arrest.  Hospitalization complicated by AKI requiring temporary hemodialysis now recovering renal function and HD cath was removed.  Also complicated by anoxic brain injury with encephalopathy and coria.  Could you resolved.  With memory/attention/orientation slowly improving.  Echo showed cardiomyopathy with EF down to 25% which recovered to.  55% range in 3 days on repeat echo. [facility] service on 07/[facility].  Patient had a cardiac stress test which was read positive by radiology but cardiology disagreed with radiology interpretation and recommended outpatient follow-up.  Patient is awaiting disposition to skilled facility in [name].    Subjective   Patient seen and examined, denies any complaints.  Cognition slowly improving.   Assessment/Plan:     1. 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  Echo showed EF 25 to 30% on admission which improved to 35 to 60% on follow-up echo without any wall motion abnormalities.  Troponin elevated at time of admission likely from cardiac arrest/CPR but not acute coronary syndrome.  Cardiology saw the patient and recommended that stress test result was nonischemic.  Recommended outpatient follow-up. 2. 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  Speech therapy working with patient for cognitive impairment.  Postarrest coria has resolved.  He was seen by neurology and recommended outpatient neurology follow-up if persistent deficits after rehab. 3. Acute respiratory failure with hypoxia-extubated 07/06/2018.  Status post course of  antibiotics for aspiration pneumonia. 4. Acute kidney injury-due to cardiac arrest/shock.  Required CRRT for of time.  Temporary HD cath removed on 7/23.  Creatinine continues to improve.  Today creatinine is 2.01 5. Enlarged prostate/gross hematuria-seen by urology.  Given large prostate recommended avoiding Foley unless residuals greater than 500.  Continue Flomax. 6. Transient hematochezia-self-limited, seen by GI.  Supportive care for now.  Heparin was resumed without further bleeding.   7. Shock liver- resolved. 8. Normocytic anemia-stable likely component of iron deficiency and anemia of chronic disease.  Feraheme given on 07/18/2018.  Second dose ordered on 07/21/2018.  Hemoglobin was 11.0 yesterday.  Check CBC periodically.     DVT prophylaxis: Lovenox  Code Status: Full code  Family Communication: No family at bedside  Disposition Plan: likely home when medically ready for discharge   Consultants:  GI  CCM  Neurology  Urology  Procedures:  Right IJ hemodialysis catheter  CRRT  07/04/18 Echo Study Conclusions   Impressions:  - Normal LV function; mildly dilated aortic root; mild RVE.  07/01/18 Echo Study Conclusions    Impressions:  - Technically difficult; definity used; severe LV dysfunction; mild LVH; mild diastolic dysfunction; enlarged right side with moderate to severe RV dysfunction.  7/5 Final Interpretation: Right: There is no evidence of deep vein thrombosis in the lower extremity. No cystic structure found in the popliteal fossa. Left: There is no evidence of deep vein thrombosis in the lower extremity. However, portions of this examination were limited- see technologist comments above. No cystic structure found in the popliteal fossa.    Antibiotics:   Anti-infectives (From admission, onward)   Start     Dose/Rate Route Frequency Ordered Stop   07/06/18 1200  piperacillin-tazobactam (ZOSYN) IVPB 3.375 g  Note to Pharmacy:   Please stop after today   3.375 g 100 mL/hr over 30 Minutes Intravenous Every 6 hours 07/06/18 1015 07/06/18 1932   07/02/18 1800  piperacillin-tazobactam (ZOSYN) IVPB 3.375 g  Status:  Discontinued     3.375 g 12.5 mL/hr over 240 Minutes Intravenous Every 6 hours 07/02/18 1309 07/02/18 1310   07/02/18 1800  piperacillin-tazobactam (ZOSYN) IVPB 3.375 g  Status:  Discontinued     3.375 g 100 mL/hr over 30 Minutes Intravenous Every 6 hours 07/02/18 1311 07/06/18 1015   07/02/18 1200  piperacillin-tazobactam (ZOSYN) IVPB 2.25 g  Status:  Discontinued     2.25 g 100 mL/hr over 30 Minutes Intravenous Every 6 hours 07/02/18 0728 07/02/18 1309   07/01/18 0100  piperacillin-tazobactam (ZOSYN) IVPB 3.375 g  Status:  Discontinued     3.375 g 12.5 mL/hr over 240 Minutes Intravenous Every 8 hours 06/30/18 1812 07/02/18 0728   06/30/18 1815  piperacillin-tazobactam (ZOSYN) IVPB 3.375 g     3.375 g 100 mL/hr over 30 Minutes Intravenous  Once 06/30/18 1804 06/30/18 1945       Objective   Vitals:   08/03/18 1523 08/03/18 2025 08/04/18 0432 08/04/18 1359  BP: 109/78 90/62 103/70 113/80  Pulse: 93 88 (!) 48 79  Resp: 18  16 18   Temp: 97.6 F (36.4 C) 98.4 F (36.9 C) 99.4 F (37.4 C) 98.5 F (36.9 C)  TempSrc: Oral Oral Oral Oral  SpO2: 98% 100% 94% 98%  Weight:      Height:        Intake/Output Summary (Last 24 hours) at 08/04/2018 1427 Last data filed at 08/04/2018 0900 Gross per 24 hour  Intake 480 ml  Output -  Net 480 ml   Filed Weights   07/28/18 0557 07/28/18 1649 07/29/18 0526  Weight: 85.3 kg 85.4 kg 81.2 kg     Physical Examination:   Physical Exam: Eyes: No icterus, extraocular muscles intact  Mouth: Oral mucosa is moist, no lesions on palate,  Neck: Supple, no deformities, masses, or tenderness Lungs: Normal respiratory effort, bilateral clear to auscultation, no crackles or wheezes.  Heart: Regular rate and rhythm, S1 and S2 normal, no murmurs, rubs  auscultated Abdomen: BS normoactive,soft,nondistended,non-tender to palpation,no organomegaly Extremities: No pretibial edema, no erythema, no cyanosis, no clubbing Neuro : Alert and oriented to place and person, No focal deficits Skin: No rashes seen on exam    Data Reviewed: I have personally reviewed following labs and imaging studies  CBG: No results for input(s): GLUCAP in the last 168 hours.  CBC: Recent Labs  Lab 07/31/18 0700 08/03/18 0504  WBC 7.0 5.7  HGB 10.7* 11.0*  HCT 36.4* 37.0*  MCV 75.5* 75.7*  PLT 232 178    Basic Metabolic Panel: Recent Labs  Lab 07/31/18 0700  NA 138  K 4.0  CL 106  CO2 23  GLUCOSE 106*  BUN 25*  CREATININE 2.01*  CALCIUM 9.4    No results found for this or any previous visit (from the past 240 hour(s)).   Liver Function Tests: No results for input(s): AST, ALT, ALKPHOS, BILITOT, PROT, ALBUMIN in the last 168 hours. No results for input(s): LIPASE, AMYLASE in the last 168 hours. No results for input(s): AMMONIA in the last 168 hours.  Cardiac Enzymes: No results for input(s): CKTOTAL, CKMB, CKMBINDEX, TROPONINI in the last 168 hours. BNP (last 3 results) No results for input(s): BNP in the last 8760 hours.  ProBNP (last 3 results) No  results for input(s): PROBNP in the last 8760 hours.    Studies: No results found.  Scheduled Meds: . amLODipine  10 mg Oral Daily  . darbepoetin (ARANESP) injection - NON-DIALYSIS  100 mcg Subcutaneous Q Mon-1800  . enoxaparin (LOVENOX) injection  40 mg Subcutaneous Q24H  . feeding supplement (ENSURE ENLIVE)  237 mL Oral BID BM  . ferrous sulfate  325 mg Oral Q breakfast  . tamsulosin  0.4 mg Oral Daily      Time spent: 20 min  Meredeth Ide   [facility] Pager [phone]. If 7PM-7AM, please contact night-coverage at [email or web address], Office  [phone]  password [email or web address]  08/04/2018, 2:27 PM  LOS: 35 days

## 2018-08-04 NOTE — Progress Notes (Signed)
PPD was injected into left medial forearm and area was circled.

## 2018-08-05 NOTE — Progress Notes (Signed)
Occupational Therapy Treatment Patient Details Name: Christopher Dominguez MRN: [id number] DOB: 1961/02/24 Today's Date: 08/05/2018    History of present illness Pt is a 57 y.o. 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 CT 7/4 showed bilateral occipital edema and R frontoparietal edema, question anoxic brain injury or posterior reversible encephalopathy syndrome; no hemorrhage or midline shift. CT 7/5 showed no acute intracranial abnormality. ETT 7/4-7/10. Acute renal failure; CRRT stopped 7/12. PMH includes depression.   OT comments  Pt progressing towards acute OT goals. Focus of session was UB/LB bathing tasks, functional mobility, and cognitive strategies for reorienting to time and situation. Min A for balance during LB ADLs and functional mobility. Pt found to be incontinent of urine in bed upon OT arrival. Sister present and discussing with OT and nurse limiting pt's fluids past a certain time in the evening to try to decrease overnight incontinence. D/c plan remains appropriate.    Follow Up Recommendations  SNF;Supervision/Assistance - 24 hour    Equipment Recommendations  Other (comment)(Defer to next venue)    Recommendations for Other Services      Precautions / Restrictions Precautions Precautions: Fall Precaution Comments: impulsive Restrictions Weight Bearing Restrictions: No       Mobility Bed Mobility Overal bed mobility: Needs Assistance Bed Mobility: Supine to Sit     Supine to sit: Supervision     General bed mobility comments: Supervision for safety. No cues required.  Transfers Overall transfer level: Needs assistance Equipment used: None Transfers: Sit to/from Stand Sit to Stand: Min assist         General transfer comment: steadying assist and safety. Impulsive    Balance Overall balance assessment: Needs assistance Sitting-balance support: Feet supported;No upper extremity  supported Sitting balance-Leahy Scale: Good Sitting balance - Comments: light sponge bath sitting EOB     Standing balance-Leahy Scale: Good Standing balance comment: fair-good balance during mobility                           ADL either performed or assessed with clinical judgement   ADL Overall ADL's : Needs assistance/impaired         Upper Body Bathing: Set up;Minimal assistance;Sitting;Supervision/ safety Upper Body Bathing Details (indicate cue type and reason): able to complete light cleanup with setup and supervision, min A for throughness Lower Body Bathing: Min guard;Moderate assistance;Sitting/lateral leans;Sit to/from stand Lower Body Bathing Details (indicate cue type and reason): Pt completed partial LB bathing in sitting/lateral leans, mod A for throughness and balance in standing position.                     Functional mobility during ADLs: Minimal assistance General ADL Comments: required mod multimodal cues for redirection to task     Vision       Perception     Praxis      Cognition Arousal/Alertness: Awake/alert Behavior During Therapy: Flat affect;Restless Overall Cognitive Status: Impaired/Different from baseline Area of Impairment: Orientation;Memory;Problem solving;Awareness;Safety/judgement                 Orientation Level: Time;Situation Current Attention Level: Sustained Memory: Decreased short-term memory Following Commands: Follows one step commands consistently Safety/Judgement: Decreased awareness of deficits;Decreased awareness of safety   Problem Solving: Slow processing;Difficulty sequencing General Comments: Noted to try to use compensatory strategies for memory deficits for example looking around room when asked what year it is. Initially stating "  1979." With cueing able to identify 2019 as current year. Correctly stated place on first attempt. Initially incorrectly stating birth month as "January" but when  asked at a later time correctly responded with full birthdate. Incorrectly stating that one of his sisters is "staying down that hall by herself."        Exercises     Shoulder Instructions       General Comments Sister present at start of session    Pertinent Vitals/ Pain       Pain Assessment: No/denies pain  Home Living                                          Prior Functioning/Environment              Frequency  Min 2X/week        Progress Toward Goals  OT Goals(current goals can now be found in the care plan section)  Progress towards OT goals: Progressing toward goals  Acute Rehab OT Goals Patient Stated Goal: Sister's goal "for Christopher Dominguez to come home." OT Goal Formulation: With family Time For Goal Achievement: 08/10/18 Potential to Achieve Goals: Fair ADL Goals Pt Will Perform Eating: with modified independence Pt Will Perform Grooming: with min assist;standing Pt Will Perform Upper Body Bathing: with supervision;sitting Pt Will Perform Lower Body Bathing: with min assist;sit to/from stand Pt Will Perform Lower Body Dressing: with modified independence;sit to/from stand Pt Will Transfer to Toilet: with modified independence;ambulating Additional ADL Goal #1: Pt will be oriented x 4 with min cues and use of external cues Additional ADL Goal #2: Pt will attend to simple ADL task x 2 mins with no more than 3 cues.  Plan Discharge plan remains appropriate    Co-evaluation                 AM-PAC PT "6 Clicks" Daily Activity     Outcome Measure   Help from another person eating meals?: A Little Help from another person taking care of personal grooming?: A Little Help from another person toileting, which includes using toliet, bedpan, or urinal?: A Little Help from another person bathing (including washing, rinsing, drying)?: A Little Help from another person to put on and taking off regular upper body clothing?: A Little Help  from another person to put on and taking off regular lower body clothing?: A Little 6 Click Score: 18    End of Session    OT Visit Diagnosis: Unsteadiness on feet (R26.81);Other abnormalities of gait and mobility (R26.89);Muscle weakness (generalized) (M62.81);Cognitive communication deficit (R41.841)   Activity Tolerance Patient tolerated treatment well   Patient Left in chair;Other (comment)(sitting by sink with PT present)   Nurse Communication          Time: [phone] OT Time Calculation (min): 14 min  Charges: OT General Charges $OT Visit: 1 Visit OT Treatments $Self Care/Home Management : 8-22 mins     Pilar GrammesMathews, Tywana Robotham H 08/05/2018, 11:59 AM

## 2018-08-05 NOTE — Progress Notes (Signed)
  Speech Language Pathology Treatment: Cognitive-Linquistic  Patient Details Name: Christopher KaufmannShelton M Dominguez MRN: [id number] DOB: Feb 27, 1961 Today's Date: 08/05/2018 Time: [id number] SLP Time Calculation (min) (ACUTE ONLY): 16 min  Assessment / Plan / Recommendation Clinical Impression  Pt has language of confusion and word finding difficulties, needing Max cues to maintain conversation topic. Although he is only oriented to person, he reorients himself with Mod cues for use of external aids and simple verbal problem solving. Min cues and extra time were provided for functional problem solving to use remote to reposition himself in bed. He needs Total A for intellectual awareness of acute cognitive and physical changes. Max cues were given for recall of daily events, as pt remembers small pieces of information, but then intermingles information and confabulates to fill in the gaps. Pt will continue to need SLP f/u and 24/7 supervision upon discharge.   HPI HPI: 57 y/o male had a cardiac arrest out of hospital and received 7 rounds of epinephrine to regain a pulse. Tox positive for cocaine. Intubated from 7/4-7/10. Puree diet initated with upgrade to dysphagia 2 on 7/15      SLP Plan  Continue with current plan of care       Recommendations                   Follow up Recommendations: Skilled Nursing facility SLP Visit Diagnosis: Cognitive communication deficit (Y78.295(R41.841) Plan: Continue with current plan of care       GO                Christopher Dominguez, Christopher Dominguez 08/05/2018, 5:04 PM  Christopher Dominguez, M.A. CCC-SLP [phone]

## 2018-08-05 NOTE — Progress Notes (Signed)
Physical Therapy Treatment Patient Details Name: Christopher KaufmannShelton M Dominguez MRN: [id number] DOB: 1961/05/10 Today's Date: 08/05/2018    History of Present Illness Pt is a 57 y.o. 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 CT 7/4 showed bilateral occipital edema and R frontoparietal edema, question anoxic brain injury or posterior reversible encephalopathy syndrome; no hemorrhage or midline shift. CT 7/5 showed no acute intracranial abnormality. ETT 7/4-7/10. Acute renal failure; CRRT stopped 7/12. PMH includes depression.    PT Comments    Pt was seen to attempt a Berg balance test since he had just walked with OT.  He is unable to follow all instructions without getting distracted, and noted he had 22/32 points for the portions of test pt did follow.  Will progress his balance skills and awareness of safety as his condition permits.  Follow acutely for same.   Follow Up Recommendations  SNF     Equipment Recommendations  None recommended by PT    Recommendations for Other Services OT consult;Speech consult;Rehab consult     Precautions / Restrictions Precautions Precautions: Fall Precaution Comments: impulsive Restrictions Weight Bearing Restrictions: No    Mobility  Bed Mobility               General bed mobility comments: up when PT arrived  Transfers Overall transfer level: Needs assistance Equipment used: None Transfers: Sit to/from Stand Sit to Stand: Min guard         General transfer comment: cued hand placement  Ambulation/Gait Ambulation/Gait assistance: Min guard Gait Distance (Feet): 45 Feet(just returned from hallway walk with OT) Assistive device: None Gait Pattern/deviations: Step-to pattern;Step-through pattern;Decreased stride length;Wide base of support;Trunk flexed Gait velocity: decreasd Gait velocity interpretation: <1.31 ft/sec, indicative of household ambulator General Gait  Details: Pt is cued for mobility as he performs a standing balance test with some success   Stairs             Wheelchair Mobility    Modified Rankin (Stroke Patients Only)       Balance Overall balance assessment: Needs assistance Sitting-balance support: Feet supported Sitting balance-Leahy Scale: Good   Postural control: Right lateral lean Standing balance support: Bilateral upper extremity supported;During functional activity Standing balance-Leahy Scale: Good Standing balance comment: fair standing balance                            Cognition Arousal/Alertness: Awake/alert Behavior During Therapy: Impulsive;Flat affect Overall Cognitive Status: Impaired/Different from baseline Area of Impairment: Orientation;Memory;Following commands;Safety/judgement;Awareness;Problem solving                 Orientation Level: Situation Current Attention Level: Selective Memory: Decreased recall of precautions;Decreased short-term memory Following Commands: Follows one step commands inconsistently;Follows one step commands with increased time Safety/Judgement: Decreased awareness of safety;Decreased awareness of deficits Awareness: Intellectual Problem Solving: Slow processing;Difficulty sequencing;Requires verbal cues;Requires tactile cues        Exercises      General Comments General comments (skin integrity, edema, etc.): sister arrived during session and is struggling with how pt is set up in his room, frequent interruptions to balance testing.      Pertinent Vitals/Pain Pain Assessment: No/denies pain    Home Living                      Prior Function            PT Goals (current  goals can now be found in the care plan section) Acute Rehab PT Goals PT Goal Formulation: With patient/family Time For Goal Achievement: 08/19/18 Potential to Achieve Goals: Good Progress towards PT goals: Progressing toward goals    Frequency     Min 3X/week      PT Plan Current plan remains appropriate    Co-evaluation              AM-PAC PT "6 Clicks" Daily Activity  Outcome Measure  Difficulty turning over in bed (including adjusting bedclothes, sheets and blankets)?: A Little Difficulty moving from lying on back to sitting on the side of the bed? : A Little Difficulty sitting down on and standing up from a chair with arms (e.g., wheelchair, bedside commode, etc,.)?: Unable Help needed moving to and from a bed to chair (including a wheelchair)?: A Little Help needed walking in hospital room?: A Little Help needed climbing 3-5 steps with a railing? : A Little 6 Click Score: 16    End of Session Equipment Utilized During Treatment: Gait belt Activity Tolerance: Patient tolerated treatment well Patient left: in bed;with call bell/phone within reach Nurse Communication: Mobility status PT Visit Diagnosis: Unsteadiness on feet (R26.81);Other abnormalities of gait and mobility (R26.89);Difficulty in walking, not elsewhere classified (R26.2)     Time: 0912-0940 PT Time Calculation (min) (ACUTE ONLY): 28 min  Charges:  $Therapeutic Activity: 8-22 mins $Neuromuscular Re-education: 8-22 mins                     Ivar Drape 08/05/2018, 6:51 PM   Samul Dada, PT MS Acute Rehab Dept. Number: [facility] [id number] and [facility] [phone]

## 2018-08-05 NOTE — Progress Notes (Signed)
[facility]  PROGRESS NOTE  Christopher Dominguez ZOX:[id number] DOB: January 03, 1961 DOA: 06/30/2018 PCP: Patient, No Pcp Per   Brief HPI:   57 year old male with   no significant medical history but originally from [place] admitted to critical care service at [facility] on 06/30/2018 following a cardiac arrest.  Hospitalization complicated by AKI requiring temporary hemodialysis now recovering renal function and HD cath was removed.  Also complicated by anoxic brain injury with encephalopathy and coria.  Could you resolved.  With memory/attention/orientation slowly improving.  Echo showed cardiomyopathy with EF down to 25% which recovered to.  55% range in 3 days on repeat echo. He was transferred from [facility] to hospitalist service on 07/12/2018.  Patient had a cardiac stress test which was read positive by radiology but cardiology disagreed with radiology interpretation and recommended outpatient follow-up.  Patient is awaiting disposition to skilled facility in [place].    Subjective   Patient seen and examined, no new complaints.  Awaiting skilled nursing facility placement.   Assessment/Plan:     1. 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  Echo showed EF 25 to 30% on admission which improved to 35 to 60% on follow-up echo without any wall motion abnormalities.  Troponin elevated at time of admission likely from cardiac arrest/CPR but not acute coronary syndrome.  Cardiology saw the patient and recommended that stress test result was nonischemic.  Recommended outpatient follow-up. 2. 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  Speech therapy working with patient for cognitive impairment.  Postarrest coria has resolved.  He was seen by neurology and recommended outpatient neurology follow-up if persistent deficits after rehab. 3. Acute respiratory failure with hypoxia-extubated 07/06/2018.  Status post  course of antibiotics for aspiration pneumonia. 4. Acute kidney injury-due to cardiac arrest/shock.  Required CRRT for of time.  Temporary HD cath removed on 7/23.  Creatinine continues to improve.  Today creatinine is 2.01 5. Enlarged prostate/gross hematuria-seen by urology.  Given large prostate recommended avoiding Foley unless residuals greater than 500.  Continue Flomax. 6. Transient hematochezia-self-limited, seen by GI.  Supportive care for now.  Heparin was resumed without further bleeding.   7. Shock liver- resolved. 8. Normocytic anemia-stable likely component of iron deficiency and anemia of chronic disease.  Feraheme given on 07/18/2018.  Second dose ordered on 07/21/2018.  Hemoglobin was 11.0 yesterday.  Check CBC periodically.     DVT prophylaxis: Lovenox  Code Status: Full code  Family Communication: No family at bedside  Disposition Plan: likely home when medically ready for discharge   Consultants:  GI  CCM  Neurology  Urology  Procedures:  Right IJ hemodialysis catheter  CRRT  07/04/18 Echo Study Conclusions   Impressions:  - Normal LV function; mildly dilated aortic root; mild RVE.  07/01/18 Echo Study Conclusions    Impressions:  - Technically difficult; definity used; severe LV dysfunction; mild LVH; mild diastolic dysfunction; enlarged right side with moderate to severe RV dysfunction.  7/5 Final Interpretation: Right: There is no evidence of deep vein thrombosis in the lower extremity. No cystic structure found in the popliteal fossa. Left: There is no evidence of deep vein thrombosis in the lower extremity. However, portions of this examination were limited- see technologist comments above. No cystic structure found in the popliteal fossa.    Antibiotics:   Anti-infectives (From admission, onward)   Start     Dose/Rate Route Frequency Ordered Stop   07/06/18 1200  piperacillin-tazobactam (ZOSYN) IVPB  3.375 g    Note to  Pharmacy:  Please stop after today   3.375 g 100 mL/hr over 30 Minutes Intravenous Every 6 hours 07/06/18 1015 07/06/18 1932   07/02/18 1800  piperacillin-tazobactam (ZOSYN) IVPB 3.375 g  Status:  Discontinued     3.375 g 12.5 mL/hr over 240 Minutes Intravenous Every 6 hours 07/02/18 1309 07/02/18 1310   07/02/18 1800  piperacillin-tazobactam (ZOSYN) IVPB 3.375 g  Status:  Discontinued     3.375 g 100 mL/hr over 30 Minutes Intravenous Every 6 hours 07/02/18 1311 07/06/18 1015   07/02/18 1200  piperacillin-tazobactam (ZOSYN) IVPB 2.25 g  Status:  Discontinued     2.25 g 100 mL/hr over 30 Minutes Intravenous Every 6 hours 07/02/18 0728 07/02/18 1309   07/01/18 0100  piperacillin-tazobactam (ZOSYN) IVPB 3.375 g  Status:  Discontinued     3.375 g 12.5 mL/hr over 240 Minutes Intravenous Every 8 hours 06/30/18 1812 07/02/18 0728   06/30/18 1815  piperacillin-tazobactam (ZOSYN) IVPB 3.375 g     3.375 g 100 mL/hr over 30 Minutes Intravenous  Once 06/30/18 1804 06/30/18 1945       Objective   Vitals:   08/04/18 1359 08/04/18 2031 08/05/18 0535 08/05/18 1437  BP: 113/80 121/83 120/85 114/71  Pulse: 79 86 78 83  Resp: 18 16 16 18   Temp: 98.5 F (36.9 C) 97.9 F (36.6 C) 98.2 F (36.8 C) 98.9 F (37.2 C)  TempSrc: Oral Oral Oral   SpO2: 98% 97% 100% 99%  Weight:      Height:        Intake/Output Summary (Last 24 hours) at 08/05/2018 1620 Last data filed at 08/05/2018 1500 Gross per 24 hour  Intake 780 ml  Output -  Net 780 ml   Filed Weights   07/28/18 0557 07/28/18 1649 07/29/18 0526  Weight: 85.3 kg 85.4 kg 81.2 kg     Physical Examination:   Physical Exam: Eyes: No icterus, extraocular muscles intact  Mouth: Oral mucosa is moist, no lesions on palate,  Neck: Supple, no deformities, masses, or tenderness Lungs: Normal respiratory effort, bilateral clear to auscultation, no crackles or wheezes.  Heart: Regular rate and rhythm, S1 and S2 normal, no murmurs, rubs  auscultated Abdomen: BS normoactive,soft,nondistended,non-tender to palpation,no organomegaly Extremities: No pretibial edema, no erythema, no cyanosis, no clubbing Neuro : Alert and oriented to  place and person, No focal deficits Skin: No rashes seen on exam    Data Reviewed: I have personally reviewed following labs and imaging studies  CBG: No results for input(s): GLUCAP in the last 168 hours.  CBC: Recent Labs  Lab 07/31/18 0700 08/03/18 0504  WBC 7.0 5.7  HGB 10.7* 11.0*  HCT 36.4* 37.0*  MCV 75.5* 75.7*  PLT 232 178    Basic Metabolic Panel: Recent Labs  Lab 07/31/18 0700  NA 138  K 4.0  CL 106  CO2 23  GLUCOSE 106*  BUN 25*  CREATININE 2.01*  CALCIUM 9.4    No results found for this or any previous visit (from the past 240 hour(s)).   Liver Function Tests: No results for input(s): AST, ALT, ALKPHOS, BILITOT, PROT, ALBUMIN in the last 168 hours. No results for input(s): LIPASE, AMYLASE in the last 168 hours. No results for input(s): AMMONIA in the last 168 hours.  Cardiac Enzymes: No results for input(s): CKTOTAL, CKMB, CKMBINDEX, TROPONINI in the last 168 hours. BNP (last 3 results) No results for input(s): BNP in the last 8760 hours.  ProBNP (last 3 results) No results for input(s): PROBNP in the last 8760 hours.    Studies: No results found.  Scheduled Meds: . amLODipine  10 mg Oral Daily  . darbepoetin (ARANESP) injection - NON-DIALYSIS  100 mcg Subcutaneous Q Mon-1800  . enoxaparin (LOVENOX) injection  40 mg Subcutaneous Q24H  . feeding supplement (ENSURE ENLIVE)  237 mL Oral BID BM  . ferrous sulfate  325 mg Oral Q breakfast  . tamsulosin  0.4 mg Oral Daily  . tuberculin  5 Units Intradermal Once      Time spent: 20 min  Meredeth Ide   [facility] Pager [phone]. If 7PM-7AM, please contact night-coverage at [email or web address], Office  [phone]  password [email or web address]  08/05/2018, 4:20 PM  LOS: 36 days

## 2018-08-06 NOTE — Clinical Social Work Note (Signed)
CS[phone] to see if they are able to accept patient, left a message on voice mail awaiting for call back.  CSW to continue to follow patient's progress throughout discharge planning.  Ervin KnackEric R. Aqsa Sensabaugh, MSW, Theresia MajorsLCSWA [phone]  08/06/2018 11:57 AM

## 2018-08-06 NOTE — Progress Notes (Signed)
[facility]  PROGRESS NOTE  Christopher Dominguez NGE:[id number] DOB: 10-Nov-1961 DOA: 06/30/2018 PCP: Patient, No Pcp Per   Brief HPI:   57 year old male with   no significant medical history but originally from [place] admitted to critical care service at [facility] on 06/30/2018 following a cardiac arrest.  Hospitalization complicated by AKI requiring temporary hemodialysis now recovering renal function and HD cath was removed.  Also complicated by anoxic brain injury with encephalopathy and coria.  Could you resolved.  With memory/attention/orientation slowly improving.  Echo showed cardiomyopathy with EF down to 25% which recovered to.  55% range in 3 days on repeat echo. He was transferred from [facility] to hospitalist service on 07/12/2018.  Patient had a cardiac stress test which was read positive by radiology but cardiology disagreed with radiology interpretation and recommended outpatient follow-up.  Patient is awaiting disposition to skilled facility in [place].    Subjective   Patient seen and examined, denies any complaints.  Awaiting skilled nursing facility placement.   Assessment/Plan:     1. 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  Echo showed EF 25 to 30% on admission which improved to 35 to 60% on follow-up echo without any wall motion abnormalities.  Troponin elevated at time of admission likely from cardiac arrest/CPR but not acute coronary syndrome.  Cardiology saw the patient and recommended that stress test result was nonischemic.  Recommended outpatient follow-up. 2. 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  Speech therapy working with patient for cognitive impairment.  Postarrest coria has resolved.  He was seen by neurology and recommended outpatient neurology follow-up if persistent deficits after rehab. 3. Acute respiratory failure with hypoxia-extubated 07/06/2018.  Status  post course of antibiotics for aspiration pneumonia. 4. Acute kidney injury-due to cardiac arrest/shock.  Required CRRT for of time.  Temporary HD cath removed on 7/23.  Creatinine continues to improve.  Last creatinine was 2.01 from 07/31/2018.  Will check BMP in a.m. 5. Enlarged prostate/gross hematuria-seen by urology.  Given large prostate recommended avoiding Foley unless residuals greater than 500.  Continue Flomax. 6. Transient hematochezia-self-limited, seen by GI.  Supportive care for now.  Heparin was resumed without further bleeding.   7. Shock liver- resolved. 8. Normocytic anemia-stable likely component of iron deficiency and anemia of chronic disease.  Feraheme given on 07/18/2018.  Second dose ordered on 07/21/2018.  Hemoglobin was 11.0 yesterday.  Check CBC periodically.     DVT prophylaxis: Lovenox  Code Status: Full code  Family Communication: No family at bedside  Disposition Plan: likely home when medically ready for discharge   Consultants:  GI  CCM  Neurology  Urology  Procedures:  Right IJ hemodialysis catheter  CRRT  07/04/18 Echo Study Conclusions   Impressions:  - Normal LV function; mildly dilated aortic root; mild RVE.  07/01/18 Echo Study Conclusions    Impressions:  - Technically difficult; definity used; severe LV dysfunction; mild LVH; mild diastolic dysfunction; enlarged right side with moderate to severe RV dysfunction.  7/5 Final Interpretation: Right: There is no evidence of deep vein thrombosis in the lower extremity. No cystic structure found in the popliteal fossa. Left: There is no evidence of deep vein thrombosis in the lower extremity. However, portions of this examination were limited- see technologist comments above. No cystic structure found in the popliteal fossa.    Antibiotics:   Anti-infectives (From admission, onward)   Start     Dose/Rate Route Frequency Ordered Stop  07/06/18 1200   piperacillin-tazobactam (ZOSYN) IVPB 3.375 g    Note to Pharmacy:  Please stop after today   3.375 g 100 mL/hr over 30 Minutes Intravenous Every 6 hours 07/06/18 1015 07/06/18 1932   07/02/18 1800  piperacillin-tazobactam (ZOSYN) IVPB 3.375 g  Status:  Discontinued     3.375 g 12.5 mL/hr over 240 Minutes Intravenous Every 6 hours 07/02/18 1309 07/02/18 1310   07/02/18 1800  piperacillin-tazobactam (ZOSYN) IVPB 3.375 g  Status:  Discontinued     3.375 g 100 mL/hr over 30 Minutes Intravenous Every 6 hours 07/02/18 1311 07/06/18 1015   07/02/18 1200  piperacillin-tazobactam (ZOSYN) IVPB 2.25 g  Status:  Discontinued     2.25 g 100 mL/hr over 30 Minutes Intravenous Every 6 hours 07/02/18 0728 07/02/18 1309   07/01/18 0100  piperacillin-tazobactam (ZOSYN) IVPB 3.375 g  Status:  Discontinued     3.375 g 12.5 mL/hr over 240 Minutes Intravenous Every 8 hours 06/30/18 1812 07/02/18 0728   06/30/18 1815  piperacillin-tazobactam (ZOSYN) IVPB 3.375 g     3.375 g 100 mL/hr over 30 Minutes Intravenous  Once 06/30/18 1804 06/30/18 1945       Objective   Vitals:   08/05/18 1437 08/05/18 2100 08/06/18 0652 08/06/18 1335  BP: 114/71 (!) 103/59 130/78 113/67  Pulse: 83 77 74 80  Resp: 18 18 16 17   Temp: 98.9 F (37.2 C) 97.8 F (36.6 C) 97.6 F (36.4 C) 97.9 F (36.6 C)  TempSrc:  Oral Oral Oral  SpO2: 99% 100% 100% 98%  Weight:      Height:        Intake/Output Summary (Last 24 hours) at 08/06/2018 1422 Last data filed at 08/06/2018 1000 Gross per 24 hour  Intake 1340 ml  Output 900 ml  Net 440 ml   Filed Weights   07/28/18 0557 07/28/18 1649 07/29/18 0526  Weight: 85.3 kg 85.4 kg 81.2 kg     Physical Examination:    Eyes: No icterus, extraocular muscles intact  Mouth: Oral mucosa is moist, no lesions on palate,  Neck: Supple, no deformities, masses, or tenderness Lungs: Normal respiratory effort, bilateral clear to auscultation, no crackles or wheezes.  Heart: Regular rate  and rhythm, S1 and S2 normal, no murmurs, rubs auscultated Abdomen: BS normoactive,soft,nondistended,non-tender to palpation,no organomegaly Extremities: No pretibial edema, no erythema, no cyanosis, no clubbing Neuro : Alert and oriented to  place and person, No focal deficits Skin: No rashes seen on exam    Data Reviewed: I have personally reviewed following labs and imaging studies  CBG: No results for input(s): GLUCAP in the last 168 hours.  CBC: Recent Labs  Lab 07/31/18 0700 08/03/18 0504  WBC 7.0 5.7  HGB 10.7* 11.0*  HCT 36.4* 37.0*  MCV 75.5* 75.7*  PLT 232 178    Basic Metabolic Panel: Recent Labs  Lab 07/31/18 0700  NA 138  K 4.0  CL 106  CO2 23  GLUCOSE 106*  BUN 25*  CREATININE 2.01*  CALCIUM 9.4    No results found for this or any previous visit (from the past 240 hour(s)).   Liver Function Tests: No results for input(s): AST, ALT, ALKPHOS, BILITOT, PROT, ALBUMIN in the last 168 hours. No results for input(s): LIPASE, AMYLASE in the last 168 hours. No results for input(s): AMMONIA in the last 168 hours.  Cardiac Enzymes: No results for input(s): CKTOTAL, CKMB, CKMBINDEX, TROPONINI in the last 168 hours. BNP (last 3 results) No results for input(s):  BNP in the last 8760 hours.  ProBNP (last 3 results) No results for input(s): PROBNP in the last 8760 hours.    Studies: No results found.  Scheduled Meds: . amLODipine  10 mg Oral Daily  . darbepoetin (ARANESP) injection - NON-DIALYSIS  100 mcg Subcutaneous Q Mon-1800  . enoxaparin (LOVENOX) injection  40 mg Subcutaneous Q24H  . feeding supplement (ENSURE ENLIVE)  237 mL Oral BID BM  . ferrous sulfate  325 mg Oral Q breakfast  . tamsulosin  0.4 mg Oral Daily  . tuberculin  5 Units Intradermal Once      Time spent: 20 min  Meredeth IdeGagan S Tashea Othman   [facility] Pager [phone]. If 7PM-7AM, please contact night-coverage at [email or web address], Office  [phone]  password  [email or web address]  08/06/2018, 2:22 PM  LOS: 37 days

## 2018-08-07 LAB — BASIC METABOLIC PANEL
Anion gap: 9 (ref 5–15)
BUN: 20 mg/dL (ref 6–20)
CHLORIDE: 104 mmol/L (ref 98–111)
CO2: 24 mmol/L (ref 22–32)
Calcium: 9.3 mg/dL (ref 8.9–10.3)
Creatinine, Ser: 1.79 mg/dL — ABNORMAL HIGH (ref 0.61–1.24)
GFR calc Af Amer: 46 mL/min — ABNORMAL LOW (ref >60)
GFR calc non Af Amer: 40 mL/min — ABNORMAL LOW (ref >60)
Glucose, Bld: 101 mg/dL — ABNORMAL HIGH (ref 70–99)
POTASSIUM: 4.2 mmol/L (ref 3.5–5.1)
Sodium: 137 mmol/L (ref 135–145)

## 2018-08-07 MED ORDER — BISACODYL 10 MG RE SUPP
10.0000 mg | Freq: Once | RECTAL | Status: AC
  Administered 2018-08-07: 10 mg via RECTAL
  Filled 2018-08-07: qty 1

## 2018-08-07 NOTE — Progress Notes (Signed)
[facility]  PROGRESS NOTE  Christopher KaufmannShelton M Dominguez ZOX:[id number] DOB: Oct 25, 1961 DOA: 06/30/2018 PCP: Patient, No Pcp Per   Brief HPI:   57 year old male with   no significant medical history but originally from [place] admitted to critical care service at [facility] on 06/30/2018 following a cardiac arrest.  Hospitalization complicated by AKI requiring temporary hemodialysis now recovering renal function and HD cath was removed.  Also complicated by anoxic brain injury with encephalopathy and coria.  Could you resolved.  With memory/attention/orientation slowly improving.  Echo showed cardiomyopathy with EF down to 25% which recovered to.  55% range in 3 days on repeat echo. [facility] on [facility].  Patient had a cardiac stress test which was read positive by radiology but cardiology disagreed with radiology interpretation and recommended outpatient follow-up.  Patient [name].    Subjective   Patient seen and examined, awaiting skilled nursing facility placement.   Assessment/Plan:     1. 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  Echo showed EF 25 to 30% on admission which improved to 35 to 60% on follow-up echo without any wall motion abnormalities.  Troponin elevated at time of admission likely from cardiac arrest/CPR but not acute coronary syndrome.  Cardiology saw the patient and recommended that stress test result was nonischemic.  Recommended outpatient follow-up. 2. 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  Speech therapy working with patient for cognitive impairment.  Postarrest coria has resolved.  He was seen by neurology and recommended outpatient neurology follow-up if persistent deficits after rehab. 3. Acute respiratory failure with hypoxia-extubated 07/06/2018.  Status post course of  antibiotics for aspiration pneumonia. 4. Acute kidney injury-due to cardiac arrest/shock.  Required CRRT for of time.  Temporary HD cath removed on 7/23.  Creatinine continues to improve.  Last creatinine was 2.01 from 07/31/2018.  Repeat labs today shows creatinine 1.79. 5. Enlarged prostate/gross hematuria-seen by urology.  Given large prostate recommended avoiding Foley unless residuals greater than 500.  Continue Flomax. 6. Transient hematochezia-self-limited, seen by GI.  Supportive care for now.  Heparin was resumed without further bleeding.   7. Shock liver- resolved. 8. Normocytic anemia-stable likely component of iron deficiency and anemia of chronic disease.  Feraheme given on 07/18/2018.  Second dose ordered on 07/21/2018.  Hemoglobin was 11.0 yesterday.  Check CBC periodically.     DVT prophylaxis: Lovenox  Code Status: Full code  Family Communication: Discussed with patient's sister at bedside.  Disposition Plan: likely home when medically ready for discharge   Consultants:  GI  CCM  Neurology  Urology  Procedures:  Right IJ hemodialysis catheter  CRRT  07/04/18 Echo Study Conclusions   Impressions:  - Normal LV function; mildly dilated aortic root; mild RVE.  07/01/18 Echo Study Conclusions    Impressions:  - Technically difficult; definity used; severe LV dysfunction; mild LVH; mild diastolic dysfunction; enlarged right side with moderate to severe RV dysfunction.  7/5 Final Interpretation: Right: There is no evidence of deep vein thrombosis in the lower extremity. No cystic structure found in the popliteal fossa. Left: There is no evidence of deep vein thrombosis in the lower extremity. However, portions of this examination were limited- see technologist comments above. No cystic structure found in the popliteal fossa.    Antibiotics:   Anti-infectives (From admission, onward)   Start     Dose/Rate Route Frequency Ordered Stop  07/06/18 1200  piperacillin-tazobactam (ZOSYN) IVPB 3.375 g    Note to Pharmacy:  Please stop after today   3.375 g 100 mL/hr over 30 Minutes Intravenous Every 6 hours 07/06/18 1015 07/06/18 1932   07/02/18 1800  piperacillin-tazobactam (ZOSYN) IVPB 3.375 g  Status:  Discontinued     3.375 g 12.5 mL/hr over 240 Minutes Intravenous Every 6 hours 07/02/18 1309 07/02/18 1310   07/02/18 1800  piperacillin-tazobactam (ZOSYN) IVPB 3.375 g  Status:  Discontinued     3.375 g 100 mL/hr over 30 Minutes Intravenous Every 6 hours 07/02/18 1311 07/06/18 1015   07/02/18 1200  piperacillin-tazobactam (ZOSYN) IVPB 2.25 g  Status:  Discontinued     2.25 g 100 mL/hr over 30 Minutes Intravenous Every 6 hours 07/02/18 0728 07/02/18 1309   07/01/18 0100  piperacillin-tazobactam (ZOSYN) IVPB 3.375 g  Status:  Discontinued     3.375 g 12.5 mL/hr over 240 Minutes Intravenous Every 8 hours 06/30/18 1812 07/02/18 0728   06/30/18 1815  piperacillin-tazobactam (ZOSYN) IVPB 3.375 g     3.375 g 100 mL/hr over 30 Minutes Intravenous  Once 06/30/18 1804 06/30/18 1945       Objective   Vitals:   08/06/18 1335 08/06/18 2143 08/07/18 0544 08/07/18 1448  BP: 113/67 113/73 99/73 (!) 136/123  Pulse: 80 82 76 (!) 105  Resp: 17   18  Temp: 97.9 F (36.6 C) 98.4 F (36.9 C) 98.3 F (36.8 C) 98.9 F (37.2 C)  TempSrc: Oral Oral Oral   SpO2: 98% 97% 98% 97%  Weight:      Height:       No intake or output data in the 24 hours ending 08/07/18 1623 Filed Weights   07/28/18 0557 07/28/18 1649 07/29/18 0526  Weight: 85.3 kg 85.4 kg 81.2 kg     Physical Examination:     General: Appears in no acute distress  Cardiovascular: S1-S2, regular no murmurs  Respiratory: Clear to auscultation bilaterally, no wheezing or crackles  Abdomen: Soft, nontender, no organomegaly.  Musculoskeletal: No edema of the lower extremities  Neuro-alert, oriented x2, no focal deficit noted.     Data Reviewed: I have  personally reviewed following labs and imaging studies  CBG: No results for input(s): GLUCAP in the last 168 hours.  CBC: Recent Labs  Lab 08/03/18 0504  WBC 5.7  HGB 11.0*  HCT 37.0*  MCV 75.7*  PLT 178    Basic Metabolic Panel: Recent Labs  Lab 08/07/18 0149  NA 137  K 4.2  CL 104  CO2 24  GLUCOSE 101*  BUN 20  CREATININE 1.79*  CALCIUM 9.3    No results found for this or any previous visit (from the past 240 hour(s)).   Liver Function Tests: No results for input(s): AST, ALT, ALKPHOS, BILITOT, PROT, ALBUMIN in the last 168 hours. No results for input(s): LIPASE, AMYLASE in the last 168 hours. No results for input(s): AMMONIA in the last 168 hours.  Cardiac Enzymes: No results for input(s): CKTOTAL, CKMB, CKMBINDEX, TROPONINI in the last 168 hours. BNP (last 3 results) No results for input(s): BNP in the last 8760 hours.  ProBNP (last 3 results) No results for input(s): PROBNP in the last 8760 hours.    Studies: No results found.  Scheduled Meds: . amLODipine  10 mg Oral Daily  . bisacodyl  10 mg Rectal Once  . darbepoetin (ARANESP) injection - NON-DIALYSIS  100 mcg Subcutaneous Q Mon-1800  . enoxaparin (LOVENOX) injection  40 mg  Subcutaneous Q24H  . feeding supplement (ENSURE ENLIVE)  237 mL Oral BID BM  . ferrous sulfate  325 mg Oral Q breakfast  . tamsulosin  0.4 mg Oral Daily      Time spent: 20 min  Meredeth Ide   [facility] Pager [phone]. If 7PM-7AM, please contact night-coverage at [email or web address], Office  [phone]  password [email or web address]  08/07/2018, 4:23 PM  LOS: 38 days

## 2018-08-08 LAB — CREATININE, SERUM
CREATININE: 1.77 mg/dL — AB (ref 0.61–1.24)
GFR, EST AFRICAN AMERICAN: 47 mL/min — AB (ref >60)
GFR, EST NON AFRICAN AMERICAN: 41 mL/min — AB (ref >60)

## 2018-08-08 LAB — CBC
HCT: 38.4 % — ABNORMAL LOW (ref 39.0–52.0)
HEMOGLOBIN: 11.4 g/dL — AB (ref 13.0–17.0)
MCH: 22.6 pg — AB (ref 26.0–34.0)
MCHC: 29.7 g/dL — AB (ref 30.0–36.0)
MCV: 76 fL — ABNORMAL LOW (ref 78.0–100.0)
PLATELETS: 152 10*3/uL (ref 150–400)
RBC: 5.05 MIL/uL (ref 4.22–5.81)
RDW: 19.9 % — AB (ref 11.5–15.5)
WBC: 5 10*3/uL (ref 4.0–10.5)

## 2018-08-08 NOTE — Clinical Social Work Note (Signed)
Spoke with Shanda BumpsJessica at [facility]. She has reviewed referral and determined patient is too high functioning for their facility.  Charlynn CourtSarah Bradshaw Minihan, CSW [phone]

## 2018-08-08 NOTE — Care Management Note (Addendum)
Case Management Note  Patient Details  Name: Christopher Dominguez MRN: [id number] Date of Birth: 01/26/1961  Subjective/Objective:        Admitted  on 06/30/2018 following a cardiac arrest,toxic cardia myopathy ? due to cocaine. Pt originally from [place].     Hospitalization complicated by AKI requiring temporary hemodialysis now recovering renal function and  anoxic brain injury with encephalopathy and coria.            Action/Plan: Awaiting placement... CSW managing disposition. NCM   Expected Discharge Date:  07/15/18               Expected Discharge Plan:  ALF  In-House Referral:  Clinical Social Work  Discharge planning Services  CM Consult  Post Acute Care Choice:    Choice offered to:     DME Arranged:    DME Agency:     HH Arranged:    HH Agency:     Status of Service:  In process, will continue to follow  If discussed at Long Length of Stay Meetings, dates discussed:    Additional Comments:  Epifanio LeschesCole, [name] Hudson, RN 08/08/2018, 10:56 AM

## 2018-08-08 NOTE — Progress Notes (Signed)
[facility]  PROGRESS NOTE  Christopher KaufmannShelton M Dominguez ZOX:[id number] DOB: Oct 25, 1961 DOA: 06/30/2018 PCP: Patient, No Pcp Per   Brief HPI:   57 year old male with   no significant medical history but originally from [place] admitted to critical care service at [facility] on 06/30/2018 following a cardiac arrest.  Hospitalization complicated by AKI requiring temporary hemodialysis now recovering renal function and HD cath was removed.  Also complicated by anoxic brain injury with encephalopathy and coria.  Could you resolved.  With memory/attention/orientation slowly improving.  Echo showed cardiomyopathy with EF down to 25% which recovered to.  55% range in 3 days on repeat echo. [facility].  Patient had a cardiac stress test which was read positive by radiology but cardiology disagreed with radiology interpretation and recommended outpatient follow-up.  Patient is awaiting disposition to skilled facility in [name].    Subjective   Patient seen and examined, awaiting skilled nursing facility placement.   Assessment/Plan:     1. 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  Echo showed EF 25 to 30% on admission which improved to 35 to 60% on follow-up echo without any wall motion abnormalities.  Troponin elevated at time of admission likely from cardiac arrest/CPR but not acute coronary syndrome.  Cardiology saw the patient and recommended that stress test result was nonischemic.  Recommended outpatient follow-up. 2. 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  Speech therapy working with patient for cognitive impairment.  Postarrest coria has resolved.  He was seen by neurology and recommended outpatient neurology follow-up if persistent deficits after rehab. 3. Acute respiratory failure with hypoxia-extubated 07/06/2018.  Status post course of  antibiotics for aspiration pneumonia. 4. Acute kidney injury-due to cardiac arrest/shock.  Required CRRT for of time.  Temporary HD cath removed on 7/23.  Creatinine continues to improve.  Last creatinine was 2.01 from 07/31/2018.  Repeat labs today shows creatinine 1.79. 5. Enlarged prostate/gross hematuria-seen by urology.  Given large prostate recommended avoiding Foley unless residuals greater than 500.  Continue Flomax. 6. Transient hematochezia-self-limited, seen by GI.  Supportive care for now.  Heparin was resumed without further bleeding.   7. Shock liver- resolved. 8. Normocytic anemia-stable likely component of iron deficiency and anemia of chronic disease.  Feraheme given on 07/18/2018.  Second dose ordered on 07/21/2018.  Hemoglobin was 11.0 yesterday.  Check CBC periodically.     DVT prophylaxis: Lovenox  Code Status: Full code  Family Communication: Discussed with patient's sister at bedside.  Disposition Plan: likely home when medically ready for discharge   Consultants:  GI  CCM  Neurology  Urology  Procedures:  Right IJ hemodialysis catheter  CRRT  07/04/18 Echo Study Conclusions   Impressions:  - Normal LV function; mildly dilated aortic root; mild RVE.  07/01/18 Echo Study Conclusions    Impressions:  - Technically difficult; definity used; severe LV dysfunction; mild LVH; mild diastolic dysfunction; enlarged right side with moderate to severe RV dysfunction.  7/5 Final Interpretation: Right: There is no evidence of deep vein thrombosis in the lower extremity. No cystic structure found in the popliteal fossa. Left: There is no evidence of deep vein thrombosis in the lower extremity. However, portions of this examination were limited- see technologist comments above. No cystic structure found in the popliteal fossa.    Antibiotics:   Anti-infectives (From admission, onward)   Start     Dose/Rate Route Frequency Ordered Stop  07/06/18 1200  piperacillin-tazobactam (ZOSYN) IVPB 3.375 g    Note to Pharmacy:  Please stop after today   3.375 g 100 mL/hr over 30 Minutes Intravenous Every 6 hours 07/06/18 1015 07/06/18 1932   07/02/18 1800  piperacillin-tazobactam (ZOSYN) IVPB 3.375 g  Status:  Discontinued     3.375 g 12.5 mL/hr over 240 Minutes Intravenous Every 6 hours 07/02/18 1309 07/02/18 1310   07/02/18 1800  piperacillin-tazobactam (ZOSYN) IVPB 3.375 g  Status:  Discontinued     3.375 g 100 mL/hr over 30 Minutes Intravenous Every 6 hours 07/02/18 1311 07/06/18 1015   07/02/18 1200  piperacillin-tazobactam (ZOSYN) IVPB 2.25 g  Status:  Discontinued     2.25 g 100 mL/hr over 30 Minutes Intravenous Every 6 hours 07/02/18 0728 07/02/18 1309   07/01/18 0100  piperacillin-tazobactam (ZOSYN) IVPB 3.375 g  Status:  Discontinued     3.375 g 12.5 mL/hr over 240 Minutes Intravenous Every 8 hours 06/30/18 1812 07/02/18 0728   06/30/18 1815  piperacillin-tazobactam (ZOSYN) IVPB 3.375 g     3.375 g 100 mL/hr over 30 Minutes Intravenous  Once 06/30/18 1804 06/30/18 1945       Objective   Vitals:   08/07/18 2056 08/08/18 0436 08/08/18 0932 08/08/18 1442  BP: 105/70 119/79 105/72 117/78  Pulse: 87 75  90  Resp: 18 18  18   Temp: 98.6 F (37 C) 98.3 F (36.8 C)  98.9 F (37.2 C)  TempSrc: Oral Oral    SpO2: 100% 100%  100%  Weight:      Height:        Intake/Output Summary (Last 24 hours) at 08/08/2018 1722 Last data filed at 08/08/2018 1500 Gross per 24 hour  Intake 580 ml  Output -  Net 580 ml   Filed Weights   07/28/18 0557 07/28/18 1649 07/29/18 0526  Weight: 85.3 kg 85.4 kg 81.2 kg     Physical Examination:     Lungs: Normal respiratory effort, bilateral clear to auscultation, no crackles or wheezes.  Heart: Regular rate and rhythm, S1 and S2 normal, no murmurs, rubs auscultated Abdomen: BS normoactive,soft,nondistended,non-tender to palpation,no organomegaly Extremities: No pretibial edema,  no erythema, no cyanosis, no clubbing Neuro : Alert and oriented to  place and person, No focal deficits      Data Reviewed: I have personally reviewed following labs and imaging studies  CBG: No results for input(s): GLUCAP in the last 168 hours.  CBC: Recent Labs  Lab 08/03/18 0504 08/08/18 0433  WBC 5.7 5.0  HGB 11.0* 11.4*  HCT 37.0* 38.4*  MCV 75.7* 76.0*  PLT 178 152    Basic Metabolic Panel: Recent Labs  Lab 08/07/18 0149 08/08/18 0433  NA 137  --   K 4.2  --   CL 104  --   CO2 24  --   GLUCOSE 101*  --   BUN 20  --   CREATININE 1.79* 1.77*  CALCIUM 9.3  --     No results found for this or any previous visit (from the past 240 hour(s)).   Liver Function Tests: No results for input(s): AST, ALT, ALKPHOS, BILITOT, PROT, ALBUMIN in the last 168 hours. No results for input(s): LIPASE, AMYLASE in the last 168 hours. No results for input(s): AMMONIA in the last 168 hours.  Cardiac Enzymes: No results for input(s): CKTOTAL, CKMB, CKMBINDEX, TROPONINI in the last 168 hours. BNP (last 3 results) No results for input(s): BNP in the last 8760 hours.  ProBNP (last  3 results) No results for input(s): PROBNP in the last 8760 hours.    Studies: No results found.  Scheduled Meds: . amLODipine  10 mg Oral Daily  . enoxaparin (LOVENOX) injection  40 mg Subcutaneous Q24H  . feeding supplement (ENSURE ENLIVE)  237 mL Oral BID BM  . ferrous sulfate  325 mg Oral Q breakfast  . tamsulosin  0.4 mg Oral Daily      Time spent: 20 min  Meredeth IdeGagan S Liliann File   [facility] Pager [phone]. If 7PM-7AM, please contact night-coverage at [email or web address], Office  [phone]  password [email or web address]  08/08/2018, 5:22 PM  LOS: 39 days

## 2018-08-09 NOTE — Progress Notes (Signed)
[facility]  PROGRESS NOTE  Christopher Dominguez:[id number] DOB: 1961/05/20 DOA: 06/30/2018 PCP: Patient, No Pcp Per   Brief HPI:   57 year old male with   no significant medical history but originally from [place] admitted to critical care service at [facility] on 06/30/2018 following a cardiac arrest.  Hospitalization complicated by AKI requiring temporary hemodialysis now recovering renal function and HD cath was removed.  Also complicated by anoxic brain injury with encephalopathy and coria.  Could you resolved.  With memory/attention/orientation slowly improving.  Echo showed cardiomyopathy with EF down to 25% which recovered to.  55% range in 3 days on repeat echo. He was transferred from [facility] to hospitalist service on 07/12/2018.  Patient had a cardiac stress test which was read positive by radiology but cardiology disagreed with radiology interpretation and recommended outpatient follow-up.  Patient is awaiting disposition to skilled facility in [place].    Subjective   Patient seen and examined, denies any complaints.   Assessment/Plan:     1. 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  Echo showed EF 25 to 30% on admission which improved to 35 to 60% on follow-up echo without any wall motion abnormalities.  Troponin elevated at time of admission likely from cardiac arrest/CPR but not acute coronary syndrome.  Cardiology saw the patient and recommended that stress test result was nonischemic.  Recommended outpatient follow-up. 2. 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  Speech therapy working with patient for cognitive impairment.  Postarrest chorea has resolved.  He was seen by neurology and recommended outpatient neurology follow-up if persistent deficits after rehab. 3. Acute respiratory failure with hypoxia-extubated 07/06/2018.  Status post course of antibiotics for aspiration  pneumonia. 4. Acute kidney injury-due to cardiac arrest/shock.  Required CRRT for of time.  Temporary HD cath removed on 7/23.  Creatinine continues to improve.  Last creatinine was 2.01 from 07/31/2018.  Repeat labs today shows creatinine 1.79. 5. Enlarged prostate/gross hematuria-seen by urology.  Given large prostate recommended avoiding Foley unless residuals greater than 500.  Continue Flomax. 6. Transient hematochezia-self-limited, seen by GI.  Supportive care for now.  Heparin was resumed without further bleeding.   7. Shock liver- resolved. 8. Hypertension- BP stable, continue amlodipine. 9. Normocytic anemia-stable likely component of iron deficiency and anemia of chronic disease.  Feraheme given on 07/18/2018.  Second dose ordered on 07/21/2018.  Hemoglobin was 11.0 yesterday.  Check CBC periodically.     DVT prophylaxis: Lovenox  Code Status: Full code  Family Communication: Discussed with patient's sister at bedside.  Disposition Plan: likely home when medically ready for discharge   Consultants:  GI  CCM  Neurology  Urology  Procedures:  Right IJ hemodialysis catheter  CRRT  07/04/18 Echo Study Conclusions   Impressions:  - Normal LV function; mildly dilated aortic root; mild RVE.  07/01/18 Echo Study Conclusions    Impressions:  - Technically difficult; definity used; severe LV dysfunction; mild LVH; mild diastolic dysfunction; enlarged right side with moderate to severe RV dysfunction.  7/5 Final Interpretation: Right: There is no evidence of deep vein thrombosis in the lower extremity. No cystic structure found in the popliteal fossa. Left: There is no evidence of deep vein thrombosis in the lower extremity. However, portions of this examination were limited- see technologist comments above. No cystic structure found in the popliteal fossa.    Antibiotics:   Anti-infectives (From admission, onward)   Start     Dose/Rate Route  Frequency Ordered Stop   07/06/18 1200  piperacillin-tazobactam (ZOSYN) IVPB 3.375 g    Note to Pharmacy:  Please stop after today   3.375 g 100 mL/hr over 30 Minutes Intravenous Every 6 hours 07/06/18 1015 07/06/18 1932   07/02/18 1800  piperacillin-tazobactam (ZOSYN) IVPB 3.375 g  Status:  Discontinued     3.375 g 12.5 mL/hr over 240 Minutes Intravenous Every 6 hours 07/02/18 1309 07/02/18 1310   07/02/18 1800  piperacillin-tazobactam (ZOSYN) IVPB 3.375 g  Status:  Discontinued     3.375 g 100 mL/hr over 30 Minutes Intravenous Every 6 hours 07/02/18 1311 07/06/18 1015   07/02/18 1200  piperacillin-tazobactam (ZOSYN) IVPB 2.25 g  Status:  Discontinued     2.25 g 100 mL/hr over 30 Minutes Intravenous Every 6 hours 07/02/18 0728 07/02/18 1309   07/01/18 0100  piperacillin-tazobactam (ZOSYN) IVPB 3.375 g  Status:  Discontinued     3.375 g 12.5 mL/hr over 240 Minutes Intravenous Every 8 hours 06/30/18 1812 07/02/18 0728   06/30/18 1815  piperacillin-tazobactam (ZOSYN) IVPB 3.375 g     3.375 g 100 mL/hr over 30 Minutes Intravenous  Once 06/30/18 1804 06/30/18 1945       Objective   Vitals:   08/08/18 1442 08/08/18 2208 08/09/18 0619 08/09/18 0953  BP: 117/78 104/81 101/70 107/70  Pulse: 90 73 83   Resp: 18 16 16    Temp: 98.9 F (37.2 C)  98.4 F (36.9 C)   TempSrc:   Oral   SpO2: 100% 98% 99%   Weight:      Height:        Intake/Output Summary (Last 24 hours) at 08/09/2018 1616 Last data filed at 08/09/2018 0900 Gross per 24 hour  Intake 350 ml  Output -  Net 350 ml   Filed Weights   07/28/18 0557 07/28/18 1649 07/29/18 0526  Weight: 85.3 kg 85.4 kg 81.2 kg     Physical Examination:    Neck: Supple, no deformities, masses, or tenderness Lungs: Normal respiratory effort, bilateral clear to auscultation, no crackles or wheezes.  Heart: Regular rate and rhythm, S1 and S2 normal, no murmurs, rubs auscultated Abdomen: BS normoactive,soft,nondistended,non-tender to  palpation,no organomegaly Extremities: No pretibial edema, no erythema, no cyanosis, no clubbing Neuro : Alert and oriented to  place and person, No focal deficits Skin: No rashes seen on exam      Data Reviewed: I have personally reviewed following labs and imaging studies  CBG: No results for input(s): GLUCAP in the last 168 hours.  CBC: Recent Labs  Lab 08/03/18 0504 08/08/18 0433  WBC 5.7 5.0  HGB 11.0* 11.4*  HCT 37.0* 38.4*  MCV 75.7* 76.0*  PLT 178 152    Basic Metabolic Panel: Recent Labs  Lab 08/07/18 0149 08/08/18 0433  NA 137  --   K 4.2  --   CL 104  --   CO2 24  --   GLUCOSE 101*  --   BUN 20  --   CREATININE 1.79* 1.77*  CALCIUM 9.3  --        Studies: No results found.  Scheduled Meds: . amLODipine  10 mg Oral Daily  . enoxaparin (LOVENOX) injection  40 mg Subcutaneous Q24H  . feeding supplement (ENSURE ENLIVE)  237 mL Oral BID BM  . ferrous sulfate  325 mg Oral Q breakfast  . tamsulosin  0.4 mg Oral Daily      Time spent: 20 min  Meredeth IdeGagan S Claire Dolores   [facility]  Pager [phone]. If 7PM-7AM, please contact night-coverage at [email or web address], Office  [phone]  password [email or web address]  08/09/2018, 4:16 PM  LOS: 40 days

## 2018-08-09 NOTE — Progress Notes (Signed)
Physical Therapy Treatment Patient Details Name: Christopher Dominguez MRN: [id number] DOB: 01/26/1961 Today's Date: 08/09/2018    History of Present Illness Pt is a 57 y.o. 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 CT 7/4 showed bilateral occipital edema and R frontoparietal edema, question anoxic brain injury or posterior reversible encephalopathy syndrome; no hemorrhage or midline shift. CT 7/5 showed no acute intracranial abnormality. ETT 7/4-7/10. Acute renal failure; CRRT stopped 7/12. PMH includes depression.    PT Comments    Patient seen for activity progression, ambulating in hall with supervision for safety and cognition. Good overall activity tolerance and stability. Per sister, hopeful to discharge home in next few days. Will follow.   Follow Up Recommendations  Supervision/Assistance - 24 hour(due to cognition)     Equipment Recommendations  None recommended by PT    Recommendations for Other Services OT consult;Speech consult;Rehab consult     Precautions / Restrictions Precautions Precautions: Fall Precaution Comments: impulsive Restrictions Weight Bearing Restrictions: No    Mobility  Bed Mobility Overal bed mobility: Needs Assistance Bed Mobility: Supine to Sit     Supine to sit: Supervision Sit to supine: Supervision   General bed mobility comments: supervision for safety  Transfers Overall transfer level: Needs assistance Equipment used: None Transfers: Sit to/from Stand Sit to Stand: Supervision         General transfer comment: supervision for safety, no physical assist  Ambulation/Gait Ambulation/Gait assistance: Supervision Gait Distance (Feet): (12 minutes of hallway ambulation) Assistive device: None Gait Pattern/deviations: WFL(Within Functional Limits)         Stairs             Wheelchair Mobility    Modified Rankin (Stroke Patients Only)        Balance Overall balance assessment: Needs assistance Sitting-balance support: Feet supported Sitting balance-Leahy Scale: Good       Standing balance-Leahy Scale: Good                              Cognition Arousal/Alertness: Awake/alert Behavior During Therapy: Impulsive Overall Cognitive Status: Impaired/Different from baseline Area of Impairment: Orientation;Memory;Following commands;Safety/judgement;Awareness;Problem solving                 Orientation Level: Situation Current Attention Level: Selective Memory: Decreased recall of precautions;Decreased short-term memory   Safety/Judgement: Decreased awareness of deficits Awareness: Emergent          Exercises      General Comments        Pertinent Vitals/Pain Pain Assessment: No/denies pain Faces Pain Scale: No hurt    Home Living                      Prior Function            PT Goals (current goals can now be found in the care plan section) Acute Rehab PT Goals Patient Stated Goal: Sister's goal "for Christopher Dominguez to come home." PT Goal Formulation: With patient/family Time For Goal Achievement: 08/19/18 Potential to Achieve Goals: Good Progress towards PT goals: Progressing toward goals    Frequency    Min 3X/week      PT Plan Current plan remains appropriate    Co-evaluation              AM-PAC PT "6 Clicks" Daily Activity  Outcome Measure  Difficulty turning over in bed (including adjusting bedclothes, sheets  and blankets)?: A Little Difficulty moving from lying on back to sitting on the side of the bed? : A Little Difficulty sitting down on and standing up from a chair with arms (e.g., wheelchair, bedside commode, etc,.)?: Unable Help needed moving to and from a bed to chair (including a wheelchair)?: A Little Help needed walking in hospital room?: A Little Help needed climbing 3-5 steps with a railing? : A Little 6 Click Score: 16    End of Session  Equipment Utilized During Treatment: Gait belt Activity Tolerance: Patient tolerated treatment well Patient left: in bed;with call bell/phone within reach Nurse Communication: Mobility status PT Visit Diagnosis: Unsteadiness on feet (R26.81);Other abnormalities of gait and mobility (R26.89);Difficulty in walking, not elsewhere classified (R26.2)     Time: [id number] PT Time Calculation (min) (ACUTE ONLY): 16 min  Charges:  $Gait Training: 8-22 mins                     Charlotte Crumbevon Sinahi Knights, PT DPT  Board Certified Neurologic Specialist [phone]    Fabio AsaDevon J Koree Schopf 08/09/2018, 4:12 PM

## 2018-08-09 NOTE — Progress Notes (Signed)
Nutrition Follow-up  INTERVENTION:   - Continue Ensure Enlive po BID, each supplement provides 350 kcal and 20 grams of protein  - Continue double protein portions  - Recommend obtaining new weight as last weight charted on 8/2  NUTRITION DIAGNOSIS:   Inadequate oral intake related to lethargy/confusion as evidenced by meal completion < 25%.  Resolved  GOAL:   Patient will meet greater than or equal to 90% of their needs  Met  MONITOR:   Supplement acceptance, Diet advancement, PO intake, I & O's  ASSESSMENT:   Pt with unknown PMH admitted after OOH arrest.   7/6-7/13 - CVVHD 7/8 - vomiting, TF decreased to 20 ml/hr 7/10 - extubated 7/14 - iHD started, per renal no sign of renal recovery 7/18- s/p BSE- advanced to regular diet 7/23 - HD cath removed 7/26 - renal signed off  Noted pt is awaiting SNF placement.  Spoke with pt at bedside who reports having a good appetite and eating well. Noted 95% completed lunch meal tray at time of visit. Pt states he plans to finish what is left on the tray. Pt states he is resting as he is tired from "everyone coming in here."  Noted several snack items in pt's room including chips and chocolate covered raisins.  Last weight charted on 07/29/18. Weight was 81.2 kg. Noted pt has had progressive weight loss since admission. Recommend obtaining new weight and continuing to follow weight trends.  Pt endorses drinking and enjoying the Ensure Enlive oral nutrition supplements.  Meal Completion: 50-100% since 8/9  Medications reviewed and include: Ensure Enlive BID, 325 mg ferrous sulfate daily  Labs reviewed: BUN 1.77 (H), hemoglobin 11.4 (L), HCT 38.4 (L)  Diet Order:   Diet Order            Diet regular Room service appropriate? Yes; Fluid consistency: Thin  Diet effective now              EDUCATION NEEDS:   No education needs have been identified at this time  Skin:  Skin Assessment: Reviewed RN Assessment  Last BM:   08/07/18  Height:   Ht Readings from Last 1 Encounters:  07/28/18 6' 2" (1.88 m)    Weight:   Wt Readings from Last 1 Encounters:  07/29/18 81.2 kg    Ideal Body Weight:  78.1 kg  BMI:  Body mass index is 22.98 kg/m.  Estimated Nutritional Needs:   Kcal:  2200-2400 kcal  Protein:  105-125 grams  Fluid:  2 L/day    Gaynell Face, MS, RD, LDN Pager: [phone] Weekend/After Hours: [phone]

## 2018-08-10 NOTE — Progress Notes (Signed)
Occupational Therapy Treatment Patient Details Name: Christopher KaufmannShelton M Dominguez MRN: [id number] DOB: 1961/03/27 Today's Date: 08/10/2018    History of present illness Pt is a 57 y.o. 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 CT 7/4 showed bilateral occipital edema and R frontoparietal edema, question anoxic brain injury or posterior reversible encephalopathy syndrome; no hemorrhage or midline shift. CT 7/5 showed no acute intracranial abnormality. ETT 7/4-7/10. Acute renal failure; CRRT stopped 7/12. PMH includes depression.   OT comments  Pt demonstrating ability to perform toileting, dressing and standing grooming with supervision and verbal cues for sequencing. Sister participating in session and plans to take pt home with her to [place]. Educated sister in importance of consistent routine and use of calendar and notes to assist pt with orientation and sequencing ADL.   Follow Up Recommendations  Home health OT;Supervision/Assistance - 24 hour(sister plans to take pt home with her in [place])    Equipment Recommendations       Recommendations for Other Services      Precautions / Restrictions Precautions Precautions: Fall Precaution Comments: impulsive       Mobility Bed Mobility Overal bed mobility: Needs Assistance Bed Mobility: Supine to Sit;Sit to Supine     Supine to sit: Supervision Sit to supine: Supervision   General bed mobility comments: supervision for safety  Transfers Overall transfer level: Needs assistance Equipment used: None Transfers: Sit to/from Stand Sit to Stand: Supervision         General transfer comment: supervision for safety, no physical assist    Balance     Sitting balance-Leahy Scale: Good       Standing balance-Leahy Scale: Good                             ADL either performed or assessed with clinical judgement   ADL Overall ADL's : Needs  assistance/impaired Eating/Feeding: Set up;Sitting   Grooming: Wash/dry hands;Oral care;Standing;Supervision/safety           Upper Body Dressing : Supervision/safety;Sitting   Lower Body Dressing: Supervision/safety;Sit to/from stand   Toilet Transfer: Supervision/safety;Ambulation;Regular Toilet   Toileting- ArchitectClothing Manipulation and Hygiene: Supervision/safety;Sit to/from stand       Functional mobility during ADLs: Supervision/safety General ADL Comments: Educated sister in importance of routine and use of post it notes on mirror of bathroom to assist with sequencing.     Vision       Perception     Praxis      Cognition Arousal/Alertness: Awake/alert Behavior During Therapy: Impulsive Overall Cognitive Status: Impaired/Different from baseline Area of Impairment: Orientation;Memory;Following commands;Safety/judgement;Awareness;Problem solving                 Orientation Level: Situation;Time Current Attention Level: Selective Memory: Decreased recall of precautions;Decreased short-term memory Following Commands: Follows one step commands with increased time Safety/Judgement: Decreased awareness of deficits   Problem Solving: Slow processing;Difficulty sequencing;Requires verbal cues;Requires tactile cues General Comments: cues to use compensatory strategies and environmental cues        Exercises     Shoulder Instructions       General Comments      Pertinent Vitals/ Pain       Pain Assessment: No/denies pain  Home Living  Prior Functioning/Environment              Frequency  Min 2X/week        Progress Toward Goals  OT Goals(current goals can now be found in the care plan section)  Progress towards OT goals: Progressing toward goals  Acute Rehab OT Goals Patient Stated Goal: Sister's goal "for Christopher Dominguez to come home." OT Goal Formulation: With family Time For Goal  Achievement: 08/17/18 Potential to Achieve Goals: Good  Plan Discharge plan needs to be updated    Co-evaluation                 AM-PAC PT "6 Clicks" Daily Activity     Outcome Measure   Help from another person eating meals?: None Help from another person taking care of personal grooming?: A Little Help from another person toileting, which includes using toliet, bedpan, or urinal?: A Little Help from another person bathing (including washing, rinsing, drying)?: A Little Help from another person to put on and taking off regular upper body clothing?: None Help from another person to put on and taking off regular lower body clothing?: A Little 6 Click Score: 20    End of Session Equipment Utilized During Treatment: Gait belt  OT Visit Diagnosis: Unsteadiness on feet (R26.81);Other abnormalities of gait and mobility (R26.89);Muscle weakness (generalized) (M62.81);Cognitive communication deficit (R41.841)   Activity Tolerance Patient tolerated treatment well   Patient Left in bed;with call bell/phone within reach;with family/visitor present   Nurse Communication          Time: 1410-1434 OT Time Calculation (min): 24 min  Charges: OT General Charges $OT Visit: 1 Visit OT Treatments $Self Care/Home Management : 23-37 mins  08/10/2018 Martie RoundJulie Lequisha Cammack, OTR/L Pager: [phone] [place], [place] BailiffJulie Lynn 08/10/2018, 2:40 PM

## 2018-08-10 NOTE — Progress Notes (Addendum)
PROGRESS NOTE    AHAAN ZOBRIST  ZOX:[id number] DOB: 04-13-1961 DOA: 06/30/2018 PCP: Patient, No Pcp Per   Brief Narrative:  Patient is a 57 year old male with   no significant medical history but originally from [place] admitted to critical care service at [facility] on 06/30/2018 following a cardiac arrest.  Hospitalization complicated by AKI requiring temporary hemodialysis now recovering renal function and HD cath was removed.  Also complicated by anoxic brain injury with encephalopathy and coria.   Memory/attention/orientation slowly improving.  Echo showed cardiomyopathy with EF down to 25% which recovered to.  55% range in 3 days on repeat echo. He was transferred from [facility] to hospitalist service on 07/12/2018.  Patient had a cardiac stress test which was read positive by radiology but cardiology disagreed with radiology interpretation and recommended outpatient follow-up.    Previously there was plan for him to be discharged to skilled nursing facility but it has been changed to home because it was not possible to place him in long-term care out of state at this time due to having [place] Medicaid.  Current plan is to wait discharge him on  Friday when his niece picks him to go to [place].  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  Echo showed EF 25 to 30% on admission which improved to 35 to 60% on follow-up echo without any wall motion abnormalities.  Troponin elevated at time of admission likely from cardiac arrest/CPR but not acute coronary syndrome.  Cardiology saw the patient and recommended that stress test result was nonischemic.   Recommended outpatient follow-up. 2. 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  Significantly improving. Speech therapy working with patient for cognitive impairment.  Postarrest chorea has resolved.  He was seen by neurology and recommended outpatient neurology follow-up if persistent deficits after rehab. 3. Acute respiratory failure with hypoxia-extubated 07/06/2018.  Status post course of antibiotics for aspiration pneumonia. 4. Acute kidney injury-due to cardiac arrest/shock.  Required CRRT for of time.  Temporary HD cath removed on 7/23.  Creatinine continues to improve.  5. Enlarged prostate/gross hematuria-seen by urology.  Given large prostate recommended avoiding Foley unless residuals greater than 500.  Continue Flomax. 6. Transient hematochezia-self-limited, seen by GI.  Supportive care for now.  Heparin was resumed without further bleeding.   7. Shock liver- resolved. 8. Hypertension- BP stable, continue amlodipine. 9. Normocytic anemia-stable likely component of iron deficiency and anemia of chronic disease.  Feraheme given on 07/18/2018.  Second dose ordered on 07/21/2018.  Hemoglobin currently stable.   DVT prophylaxis: Lovenox Code Status: Full Family Communication: Discussed with the sister Disposition Plan: Home on Friday   Consultants: GI, PCCM, neurology, urology  Procedures: Right-sided IJ hemodialysis catheter, CRRT  Antimicrobials: None  Subjective: Patient seen and examined the bedside this morning.  Was pretty comfortable, alert  and sitting on the chair.  No problem with ambulation.  No complaints/issues.  Objective: Vitals:   08/09/18 2144 08/10/18 0456 08/10/18 0858 08/10/18 1318  BP: 98/61 107/84 103/66 101/83  Pulse: 91 70  92  Resp: 20 16  17   Temp: 98.2 F (  36.8 C) 97.9 F (36.6 C)  98.6 F (37 C)  TempSrc: Oral Oral  Oral  SpO2: 96% 97%  97%  Weight:      Height:        Intake/Output Summary (Last 24  hours) at 08/10/2018 1339 Last data filed at 08/10/2018 1045 Gross per 24 hour  Intake 480 ml  Output -  Net 480 ml   Filed Weights   07/28/18 0557 07/28/18 1649 07/29/18 0526  Weight: 85.3 kg 85.4 kg 81.2 kg    Examination:  General exam: Appears calm and comfortable ,Not in distress,average built HEENT:PERRL,Oral mucosa moist, Ear/Nose normal on gross exam Respiratory system: Bilateral equal air entry, normal vesicular breath sounds, no wheezes or crackles  Cardiovascular system: S1 & S2 heard, RRR. No JVD, murmurs, rubs, gallops or clicks. No pedal edema. Gastrointestinal system: Abdomen is nondistended, soft and nontender. No organomegaly or masses felt. Normal bowel sounds heard. Central nervous system: Alert and oriented. No focal neurological deficits. Extremities: No edema, no clubbing ,no cyanosis, distal peripheral pulses palpable. Skin: No rashes, lesions or ulcers,no icterus ,no pallor MSK: Normal muscle bulk,tone ,power Psychiatry: Judgement and insight appear normal. Mood & affect appropriate.     Data Reviewed: I have personally reviewed following labs and imaging studies  CBC: Recent Labs  Lab 08/08/18 0433  WBC 5.0  HGB 11.4*  HCT 38.4*  MCV 76.0*  PLT 152   Basic Metabolic Panel: Recent Labs  Lab 08/07/18 0149 08/08/18 0433  NA 137  --   K 4.2  --   CL 104  --   CO2 24  --   GLUCOSE 101*  --   BUN 20  --   CREATININE 1.79* 1.77*  CALCIUM 9.3  --    GFR: Estimated Creatinine Clearance: 52.2 mL/min (A) (by C-G formula based on SCr of 1.77 mg/dL (H)). Liver Function Tests: No results for input(s): AST, ALT, ALKPHOS, BILITOT, PROT, ALBUMIN in the last 168 hours. No results for input(s): LIPASE, AMYLASE in the last 168 hours. No results for input(s): AMMONIA in the last 168 hours. Coagulation Profile: No results for input(s): INR, PROTIME in the last 168 hours. Cardiac Enzymes: No results for input(s): CKTOTAL, CKMB, CKMBINDEX, TROPONINI in the  last 168 hours. BNP (last 3 results) No results for input(s): PROBNP in the last 8760 hours. HbA1C: No results for input(s): HGBA1C in the last 72 hours. CBG: No results for input(s): GLUCAP in the last 168 hours. Lipid Profile: No results for input(s): CHOL, HDL, LDLCALC, TRIG, CHOLHDL, LDLDIRECT in the last 72 hours. Thyroid Function Tests: No results for input(s): TSH, T4TOTAL, FREET4, T3FREE, THYROIDAB in the last 72 hours. Anemia Panel: No results for input(s): VITAMINB12, FOLATE, FERRITIN, TIBC, IRON, RETICCTPCT in the last 72 hours. Sepsis Labs: No results for input(s): PROCALCITON, LATICACIDVEN in the last 168 hours.  No results found for this or any previous visit (from the past 240 hour(s)).       Radiology Studies: No results found.      Scheduled Meds: . amLODipine  10 mg Oral Daily  . enoxaparin (LOVENOX) injection  40 mg Subcutaneous Q24H  . feeding supplement (ENSURE ENLIVE)  237 mL Oral BID BM  . ferrous sulfate  325 mg Oral Q breakfast  . tamsulosin  0.4 mg Oral Daily   Continuous Infusions:   LOS: 41 days    Time spent: More than 50% of that time was spent in counseling and/or coordination of care.  Burnadette PopAmrit Nekayla Heider, MD [facility] Pager [phone]  If 7PM-7AM, please contact night-coverage [email or web address] Password [email or web address] 08/10/2018, 1:39 PM

## 2018-08-10 NOTE — Progress Notes (Signed)
  Speech Language Pathology Treatment: Cognitive-Linquistic  Patient Details Name: Christopher Dominguez MRN: [id number] DOB: Nov 17, 1961 Today's Date: 08/10/2018 Time: [id number] SLP Time Calculation (min) (ACUTE ONLY): 24 min  Assessment / Plan / Recommendation Clinical Impression  Pt needs Mod cues for sustained attention and topic maintenance. He still shows language of confusion, confabulation. SLP provided Mod cues for use of external aids to facilitate recall of information. Calendar was adjusted in his room with bolder ink so that he could better see/read it, after which he needed only Min cues to use it to reorient himself to time. Pt continues to need SLP f/u and 24/7 supervision upon discharge.   [name] sister was present and discussed potential d/c plan of going home to [place]. She says that she does not have housing there, but that he has other family members that live in that area too. We stressed the importance of 24/7 supervision, what potential safety concerns could be given his current cognitive/communicative status. She verbalizes her understanding of the information presented.    HPI HPI: 57 y/o male had a cardiac arrest out of hospital and received 7 rounds of epinephrine to regain a pulse. Tox positive for cocaine. Intubated from 7/4-7/10. Puree diet initated with upgrade to dysphagia 2 on 7/15      SLP Plan  Continue with current plan of care       Recommendations                   Follow up Recommendations: Skilled Nursing facility;24 hour supervision/assistance SLP Visit Diagnosis: Cognitive communication deficit (Y86.578(R41.841) Plan: Continue with current plan of care       GO                Maxcine Hamaiewonsky, Ashni Lonzo 08/10/2018, 12:23 PM  Maxcine HamLaura Paiewonsky, M.A. CCC-SLP [phone]

## 2018-08-10 NOTE — Progress Notes (Addendum)
CSW spoke with patients niece, Charlean SanfilippoFantasia Howerton [phone], regarding disposition plans. Niece is currently living in [place] and has been visiting with [place] in [place]. Per niece, patients spouse is also here in [place] with patient at this time. Niece voiced concerns with patient being placed in an assisted living facility due to him "needing physical therapy and having memory problems". Niece stated she would prefer patient to return home with her and patients spouse until placement can be found in [place]. CSW explained that patient was unable to be placed in long-term care out of state at this time due to having [place] Medicaid- niece voiced understanding and stated they would be pursuing [place] Medicaid for long-term care. [place] to transport patient back to [place] to live with her until further arrangements can be made. Niece does not want the social work department to continue seeking placement in [place]. CSW will notify MD and care team to plan to DC Friday 8/16 if medically stable. CSW provided niece with contact information if any further questions arise.   Stacy GardnerErin Aranza Geddes, LCSW Clinical Social Worker  [phone]

## 2018-08-11 NOTE — Progress Notes (Signed)
PROGRESS NOTE    Christopher KaufmannShelton M Dominguez  AVW:[id number] DOB: December 08, 1961 DOA: 06/30/2018 PCP: Patient, No Pcp Per   Brief Narrative:  Patient is a 57 year old male with   no significant medical history but originally from [place] admitted [facility] on 06/[facility]/2018 following a cardiac arrest.  Hospitalization complicated by AKI requiring temporary hemodialysis now recovering renal function and HD cath was removed.  Also complicated by anoxic brain injury with encephalopathy and coria.   Memory/attention/orientation slowly improving.  Echo showed cardiomyopathy with EF down to 25% which recovered to.  55% range in 3 days on repeat echo. [facility].  Patient had a cardiac stress test which was read positive by radiology but cardiology disagreed with radiology interpretation and recommended outpatient follow-up.    Previously there was plan for him to be discharged to skilled nursing facility but it has been changed to home because it was not possible to place him in long-term care out of state at this time due to having [place] Medicaid.  Anticipating discharge tomorrow.  His niece will pick him to [place].  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  Echo showed EF 25 to 30% on admission which improved to 35 to 60% on follow-up echo without any wall motion abnormalities.  Troponin elevated at time of admission likely from cardiac arrest/CPR but not acute coronary syndrome.  Cardiology saw the patient and recommended that stress test result was nonischemic.  Recommended outpatient  follow-up. 2. 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  Significantly improving. Speech therapy working with patient for cognitive impairment.  Postarrest chorea has resolved.  He was seen by neurology and recommended outpatient neurology follow-up if persistent deficits after rehab. 3. Acute respiratory failure with hypoxia-extubated 07/06/2018.  Status post course of antibiotics for aspiration pneumonia. 4. Acute kidney injury-due to cardiac arrest/shock.  Required CRRT for of time.  Temporary HD cath removed on 7/23.  Creatinine continues to improve.  5. Enlarged prostate/gross hematuria-seen by urology.  Given large prostate recommended avoiding Foley unless residuals greater than 500.  Continue Flomax. 6. Transient hematochezia-self-limited, seen by GI.  Supportive care for now.  Heparin was resumed without further bleeding.   7. Shock liver- resolved. 8. Hypertension- BP stable, continue amlodipine. 9. Normocytic anemia-stable likely component of iron deficiency and anemia of chronic disease.  Feraheme given on 07/18/2018.  Second dose ordered on 07/21/2018.  Hemoglobin currently stable.   DVT prophylaxis: Lovenox Code Status: Full Family Communication: Discussed with the sister at bedside Disposition Plan: Home tomorrow   Consultants: GI, PCCM, neurology, urology  Procedures: Right-sided IJ hemodialysis catheter, CRRT  Antimicrobials: None  Subjective: Patient seen and examined the bedside this morning.  No new issues/events.  Stable. Objective: Vitals:   08/10/18 1318 08/10/18 2051 08/11/18 0516 08/11/18 1301  BP: 101/83 106/75 102/64 133/84  Pulse: 92 72 76 70  Resp: 17 18 18 18   Temp: 98.6 F (37 C) 98.3 F (36.8 C) 98.4 F (36.9 C) 97.8 F (36.6 C)  TempSrc: Oral Oral Oral Oral  SpO2: 97% 96% 100% 96%  Weight:      Height:       No intake or output data in the 24 hours ending 08/11/18 1347 Filed Weights   07/28/18 0557  07/28/18 1649 07/29/18 0526  Weight: 85.3 kg 85.4 kg 81.2 kg    Examination:  General exam: Appears calm and comfortable ,Not in distress,average built HEENT:PERRL,Oral mucosa moist, Ear/Nose normal on gross exam Respiratory system: Bilateral equal air entry, normal vesicular breath sounds, no wheezes or crackles  Cardiovascular system: S1 & S2 heard, RRR. No JVD, murmurs, rubs, gallops or clicks. No pedal edema. Gastrointestinal system: Abdomen is nondistended, soft and nontender. No organomegaly or masses felt. Normal bowel sounds heard. Central nervous system: Alert and oriented. No focal neurological deficits. Extremities: No edema, no clubbing ,no cyanosis, distal peripheral pulses palpable. Skin: No rashes, lesions or ulcers,no icterus ,no pallor MSK: Normal muscle bulk,tone ,power Psychiatry: Judgement and insight appear normal. Mood & affect appropriate.     Data Reviewed: I have personally reviewed following labs and imaging studies  CBC: Recent Labs  Lab 08/08/18 0433  WBC 5.0  HGB 11.4*  HCT 38.4*  MCV 76.0*  PLT 152   Basic Metabolic Panel: Recent Labs  Lab 08/07/18 0149 08/08/18 0433  NA 137  --   K 4.2  --   CL 104  --   CO2 24  --   GLUCOSE 101*  --   BUN 20  --   CREATININE 1.79* 1.77*  CALCIUM 9.3  --    GFR: Estimated Creatinine Clearance: 52.2 mL/min (A) (by C-G formula based on SCr of 1.77 mg/dL (H)). Liver Function Tests: No results for input(s): AST, ALT, ALKPHOS, BILITOT, PROT, ALBUMIN in the last 168 hours. No results for input(s): LIPASE, AMYLASE in the last 168 hours. No results for input(s): AMMONIA in the last 168 hours. Coagulation Profile: No results for input(s): INR, PROTIME in the last 168 hours. Cardiac Enzymes: No results for input(s): CKTOTAL, CKMB, CKMBINDEX, TROPONINI in the last 168 hours. BNP (last 3 results) No results for input(s): PROBNP in the last 8760 hours. HbA1C: No results for input(s): HGBA1C in the last 72  hours. CBG: No results for input(s): GLUCAP in the last 168 hours. Lipid Profile: No results for input(s): CHOL, HDL, LDLCALC, TRIG, CHOLHDL, LDLDIRECT in the last 72 hours. Thyroid Function Tests: No results for input(s): TSH, T4TOTAL, FREET4, T3FREE, THYROIDAB in the last 72 hours. Anemia Panel: No results for input(s): VITAMINB12, FOLATE, FERRITIN, TIBC, IRON, RETICCTPCT in the last 72 hours. Sepsis Labs: No results for input(s): PROCALCITON, LATICACIDVEN in the last 168 hours.  No results found for this or any previous visit (from the past 240 hour(s)).       Radiology Studies: No results found.      Scheduled Meds: . amLODipine  10 mg Oral Daily  . enoxaparin (LOVENOX) injection  40 mg Subcutaneous Q24H  . feeding supplement (ENSURE ENLIVE)  237 mL Oral BID BM  . ferrous sulfate  325 mg Oral Q breakfast  . tamsulosin  0.4 mg Oral Daily   Continuous Infusions:   LOS: 42 days    Time spent: More than 50% of that time was spent in counseling and/or coordination of care.      Burnadette PopAmrit Kameshia Madruga, MD [facility] Pager [phone]  If 7PM-7AM, please contact night-coverage [email or web address] Password [email or web address] 08/11/2018, 1:47 PM

## 2018-08-12 MED ORDER — AMLODIPINE BESYLATE 10 MG PO TABS: 10.0000 mg | ORAL_TABLET | Freq: Every day | ORAL | 0 refills | Status: AC

## 2018-08-12 MED ORDER — FERROUS SULFATE 325 (65 FE) MG PO TABS: 325.0000 mg | ORAL_TABLET | Freq: Every day | ORAL | 3 refills | Status: AC

## 2018-08-12 NOTE — Progress Notes (Signed)
Physical Therapy Treatment Patient Details Name: Christopher KaufmannShelton M Dominguez MRN: [id number] DOB: 06-24-1961 Today's Date: 08/12/2018    History of Present Illness Pt is a 57 y.o. 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 CT 7/4 showed bilateral occipital edema and R frontoparietal edema, question anoxic brain injury or posterior reversible encephalopathy syndrome; no hemorrhage or midline shift. CT 7/5 showed no acute intracranial abnormality. ETT 7/4-7/10. Acute renal failure; CRRT stopped 7/12. PMH includes depression.    PT Comments    Christopher Dominguez doing well today. Agreeable to PT. Good hallway ambulation but still with safety concerns. Updated frequency today, but with chart review stating possible d/c with family.   Follow Up Recommendations  Supervision/Assistance - 24 hour     Equipment Recommendations  None recommended by PT    Recommendations for Other Services OT consult;Speech consult;Rehab consult     Precautions / Restrictions Precautions Precautions: Fall Precaution Comments: impulsive Restrictions Weight Bearing Restrictions: No    Mobility  Bed Mobility Overal bed mobility: Needs Assistance Bed Mobility: Supine to Sit;Sit to Supine     Supine to sit: Supervision Sit to supine: Supervision   General bed mobility comments: supervision for safety  Transfers Overall transfer level: Needs assistance Equipment used: None Transfers: Sit to/from Stand Sit to Stand: Supervision         General transfer comment: supervision for safety, no physical assist  Ambulation/Gait Ambulation/Gait assistance: Supervision Gait Distance (Feet): (hallway ambulation for 10 min) Assistive device: None Gait Pattern/deviations: WFL(Within Functional Limits) Gait velocity: decreasd   General Gait Details: cueing for safety with turning   Stairs             Wheelchair Mobility    Modified Rankin  (Stroke Patients Only)       Balance     Sitting balance-Leahy Scale: Good       Standing balance-Leahy Scale: Good                              Cognition Arousal/Alertness: Awake/alert Behavior During Therapy: Impulsive Overall Cognitive Status: Impaired/Different from baseline Area of Impairment: Orientation;Following commands;Safety/judgement;Problem solving                 Orientation Level: Situation;Time     Following Commands: Follows one step commands with increased time Safety/Judgement: Decreased awareness of deficits   Problem Solving: Slow processing;Difficulty sequencing;Requires verbal cues;Requires tactile cues General Comments: cueing for safety and obstacle navigation      Exercises      General Comments        Pertinent Vitals/Pain Pain Assessment: No/denies pain    Home Living                      Prior Function            PT Goals (current goals can now be found in the care plan section) Acute Rehab PT Goals Patient Stated Goal: Sister's goal "for Christopher Dominguez to come home." PT Goal Formulation: With patient/family Time For Goal Achievement: 08/19/18 Potential to Achieve Goals: Good Progress towards PT goals: Progressing toward goals    Frequency    Min 1X/week      PT Plan Frequency needs to be updated;Current plan remains appropriate    Co-evaluation              AM-PAC PT "6 Clicks" Daily Activity  Outcome Measure  Difficulty turning over in bed (including adjusting bedclothes, sheets and blankets)?: None Difficulty moving from lying on back to sitting on the side of the bed? : A Little Difficulty sitting down on and standing up from a chair with arms (e.g., wheelchair, bedside commode, etc,.)?: A Little Help needed moving to and from a bed to chair (including a wheelchair)?: A Little Help needed walking in hospital room?: A Little Help needed climbing 3-5 steps with a railing? : A Little 6  Click Score: 19    End of Session Equipment Utilized During Treatment: Gait belt Activity Tolerance: Patient tolerated treatment well Patient left: in bed;with call bell/phone within reach Nurse Communication: Mobility status PT Visit Diagnosis: Unsteadiness on feet (R26.81);Other abnormalities of gait and mobility (R26.89);Difficulty in walking, not elsewhere classified (R26.2)     Time: [id number] PT Time Calculation (min) (ACUTE ONLY): 17 min  Charges:  $Gait Training: 8-22 mins                    Kipp LaurenceStephanie R Karis Rilling, PT, DPT 08/12/18 1:32 PM Pager: [phone]

## 2018-08-12 NOTE — Progress Notes (Signed)
Pt is still awaiting discharge pending arrival of niece who is driving from [place]. States she is in route.

## 2018-08-12 NOTE — Progress Notes (Signed)
Discharge order in, family is due to arrive this afternoon from [place], provider aware.

## 2018-08-12 NOTE — Discharge Summary (Signed)
Physician Discharge Summary  Georga KaufmannShelton M Awwad RUE:[id number] DOB: [birth year] DOA: 06/30/2018  PCP: Patient, No Pcp Per  Admit date: 06/30/2018 Discharge date: 08/12/2018  Admitted From: Home Disposition:  Home  Discharge Condition:Stable CODE STATUS:FULL Diet recommendation: Heart Healthy  Brief/Interim Summary: Patient is a 57 year old male with no significant medical history but originally from [place] [facility] following a cardiac arrest. Hospitalization complicated by AKI requiring temporary hemodialysis now recovering renal function and HD cath was removed. Also complicated by anoxic brain injury with encephalopathy and coria.  Memory/attention/orientation slowly improving. Echo showed cardiomyopathy with EF down to 25% which recovered to. 55% range in 3 days on repeat echo. [facility]. Patient had a cardiac stress test which was read positive by radiology but cardiology disagreed with radiology interpretation and recommended outpatient follow-up.   Previously there was plan for him to be discharged to skilled nursing facility but it has been changed to home because it was not possible to place him in long-term care out of state at this time due to having [place] Medicaid.  He will be discharged today to home.  Following problems were addressed during his hospitalization:    1. 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 Echo showed EF 25 to 30% on admission which improved to 35 to 60% on follow-up echo without any wall motion abnormalities. Troponin elevated at time of admission likely from cardiac arrest/CPR but not acute coronary syndrome. Cardiology saw the patient and recommended that stress test result was nonischemic. Recommended outpatient follow-up. 2. 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   Significantly improving.Speech therapy working with patient for cognitive impairment. Postarrest choreahas resolved. He was seen by neurology and recommended outpatient neurology follow-up if persistent deficits. 3. Acute respiratory failure with hypoxia-extubated 07/06/2018. Status post course of antibiotics for aspiration pneumonia. 4. Acute kidney injury-due to cardiac arrest/shock. Required CRRT for of time. Temporary HD cath removed on 7/23. Creatinine continues to improve. Check BMP in a week after discharge. 5. Enlarged prostate/gross hematuria-seen by urology.  Continue Flomax. 6. Transient hematochezia-self-limited, seen by GI. Supportive care for now. Heparin was resumed without further bleeding.  7. Shock liver- resolved. 8. Hypertension- BP stable, continue amlodipine. 9. Normocytic anemia-stable likely component of iron deficiency and anemia of chronic disease. Feraheme given on 07/18/2018. Second dose ordered on 07/21/2018. Hemoglobin currently stable.Continue iron supplementation.  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 2) Follow up with neurology and cardiology as an outpatient. 3)Take prescribed medications as instructed.   Increase activity slowly   Complete by:  As directed      Allergies as of 08/12/2018   No Known Allergies     Medication List    STOP taking these medications   diclofenac 75 MG EC tablet Commonly known as:  VOLTAREN   diclofenac sodium 1 %  Gel Commonly known as:  VOLTAREN   docusate sodium 100 MG capsule Commonly known as:  COLACE   naproxen 500 MG tablet Commonly known as:  NAPROSYN     TAKE these medications   acetaminophen 500 MG tablet Commonly known as:  TYLENOL Take 1,000 mg by mouth every 6 (six) hours as needed for moderate pain.   amLODipine 10 MG tablet Commonly known as:  NORVASC Take 1 tablet (10 mg total) by mouth daily. Start taking on:  08/13/2018   ferrous sulfate 325 (65 FE) MG tablet Take 1 tablet (325 mg total) by mouth daily with breakfast. Start taking on:  08/13/2018   ibuprofen 200 MG tablet Commonly known as:  ADVIL,MOTRIN Take 400 mg by mouth as needed for headache or mild pain.   tamsulosin 0.4 MG Caps capsule Commonly known as:  FLOMAX Take 0.4 mg by mouth daily.       No Known Allergies  Consultations:  GI,Cardiology,neurology,nephrology   Procedures/Studies: Dg Chest 2 View  Result Date: 07/15/2018 CLINICAL DATA:  Fever. EXAM: CHEST - 2 VIEW COMPARISON:  07/08/2018 FINDINGS: Right jugular central venous catheter is seen with tip overlying the SVC. No evidence of pneumothorax. Heart size is within normal limits. Both lungs are clear. No evidence of pleural effusion. IMPRESSION: Right jugular central venous catheter in appropriate position. No active cardiopulmonary disease. Electronically Signed   By: Myles Rosenthal M.D.   On: 07/15/2018 15:05   Nm Myocar Multi W/spect W/wall Motion / Ef  Result Date: 07/15/2018 CLINICAL DATA:  CHEST PAIN EXAM: MYOCARDIAL IMAGING WITH SPECT (REST AND PHARMACOLOGIC-STRESS) GATED LEFT VENTRICULAR WALL MOTION STUDY LEFT VENTRICULAR EJECTION FRACTION TECHNIQUE: Standard myocardial SPECT imaging was performed after resting intravenous injection of 10 mCi Tc-75m tetrofosmin. Subsequently, intravenous infusion of Lexiscan was performed under the supervision of the Cardiology staff. At peak effect of the drug, 30 mCi Tc-1m tetrofosmin was injected  intravenously and standard myocardial SPECT imaging was performed. Quantitative gated imaging was also performed to evaluate left ventricular wall motion, and estimate left ventricular ejection fraction. COMPARISON:  None. FINDINGS: Perfusion: decreased activity of the septal and inferior walls on the stress views compared to rest imaging on all three views, compatible with inducible ischemia with pharmacologic stress. Wall Motion: grossly normal wall motion.  no lv chamber dilatation. Left Ventricular Ejection Fraction: 59 % End diastolic volume 144 ml End systolic volume 60 ml IMPRESSION: 1. Positive exam for septal and inferior wall inducible ischemia with pharmacologic stress. 2. Grossly normal wall motion. 3. Left ventricular ejection fraction 59% 4. Non invasive risk stratification*: Intermediate *2012 Appropriate Use Criteria for Coronary Revascularization Focused Update: J Am Coll Cardiol. 2012;59(9):857-881. http://content.dementiazones.com.aspx?articleid=[email or web address] Electronically Signed   By: Judie Petit.  Shick M.D.   On: 07/15/2018 15:19       Subjective: Patient seen and examined the bedside this morning.  Remains comfortable.  Hemodynamically stable.  Stable for discharge to home today.  Discharge Exam: Vitals:   08/12/18 0540 08/12/18 1024  BP: 103/80 116/80  Pulse: 71 84  Resp: 17   Temp: 98 F (36.7 C)   SpO2: 98%    Vitals:   08/11/18 1301 08/11/18 2045 08/12/18 0540 08/12/18 1024  BP: 133/84 118/84 103/80 116/80  Pulse: 70 84 71 84  Resp: 18 17 17    Temp: 97.8 F (36.6 C) 98.6 F (37 C) 98 F (36.7 C)   TempSrc: Oral Oral    SpO2: 96% 98% 98%   Weight:      Height:  General: Pt is alert, awake, not in acute distress Cardiovascular: RRR, S1/S2 +, no rubs, no gallops Respiratory: CTA bilaterally, no wheezing, no rhonchi Abdominal: Soft, NT, ND, bowel sounds + Extremities: no edema, no cyanosis    The results of significant diagnostics from this hospitalization  (including imaging, microbiology, ancillary and laboratory) are listed below for reference.     Microbiology: No results found for this or any previous visit (from the past 240 hour(s)).   Labs: BNP (last 3 results) No results for input(s): BNP in the last 8760 hours. Basic Metabolic Panel: Recent Labs  Lab 08/07/18 0149 08/08/18 0433  NA 137  --   K 4.2  --   CL 104  --   CO2 24  --   GLUCOSE 101*  --   BUN 20  --   CREATININE 1.79* 1.77*  CALCIUM 9.3  --    Liver Function Tests: No results for input(s): AST, ALT, ALKPHOS, BILITOT, PROT, ALBUMIN in the last 168 hours. No results for input(s): LIPASE, AMYLASE in the last 168 hours. No results for input(s): AMMONIA in the last 168 hours. CBC: Recent Labs  Lab 08/08/18 0433  WBC 5.0  HGB 11.4*  HCT 38.4*  MCV 76.0*  PLT 152   Cardiac Enzymes: No results for input(s): CKTOTAL, CKMB, CKMBINDEX, TROPONINI in the last 168 hours. BNP: Invalid input(s): POCBNP CBG: No results for input(s): GLUCAP in the last 168 hours. D-Dimer No results for input(s): DDIMER in the last 72 hours. Hgb A1c No results for input(s): HGBA1C in the last 72 hours. Lipid Profile No results for input(s): CHOL, HDL, LDLCALC, TRIG, CHOLHDL, LDLDIRECT in the last 72 hours. Thyroid function studies No results for input(s): TSH, T4TOTAL, T3FREE, THYROIDAB in the last 72 hours.  Invalid input(s): FREET3 Anemia work up No results for input(s): VITAMINB12, FOLATE, FERRITIN, TIBC, IRON, RETICCTPCT in the last 72 hours. Urinalysis    Component Value Date/Time   COLORURINE STRAW (A) 07/15/2018 1640   APPEARANCEUR CLEAR 07/15/2018 1640   LABSPEC 1.006 07/15/2018 1640   PHURINE 9.0 (H) 07/15/2018 1640   GLUCOSEU NEGATIVE 07/15/2018 1640   HGBUR SMALL (A) 07/15/2018 1640   BILIRUBINUR NEGATIVE 07/15/2018 1640   KETONESUR NEGATIVE 07/15/2018 1640   PROTEINUR 30 (A) 07/15/2018 1640   NITRITE NEGATIVE 07/15/2018 1640   LEUKOCYTESUR TRACE (A)  07/15/2018 1640   Sepsis Labs Invalid input(s): PROCALCITONIN,  WBC,  LACTICIDVEN Microbiology No results found for this or any previous visit (from the past 240 hour(s)).  Please note: You were cared for by a hospitalist during your hospital stay. Once you are discharged, your primary care physician will handle any further medical issues. Please note that NO REFILLS for any discharge medications will be authorized once you are discharged, as it is imperative that you return to your primary care physician (or establish a relationship with a primary care physician if you do not have one) for your post hospital discharge needs so that they can reassess your need for medications and monitor your lab values.    Time coordinating discharge: 40 minutes  SIGNED:   Burnadette PopAmrit Pixie Burgener, MD  [facility] 08/12/2018, 12:29 PM Pager [phone]  If 7PM-7AM, please contact night-coverage [email or web address] Password [email or web address]

## 2018-08-12 NOTE — Progress Notes (Signed)
Occupational Therapy Treatment Patient Details Name: Christopher KaufmannShelton Dominguez Dominguez MRN: [id number] DOB: [birth year] Today's Date: 08/12/2018    History of present illness Pt is a 57 y.o. 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 CT 7/4 showed bilateral occipital edema and R frontoparietal edema, question anoxic brain injury or posterior reversible encephalopathy syndrome; no hemorrhage or midline shift. CT 7/5 showed no acute intracranial abnormality. ETT 7/4-7/10. Acute renal failure; CRRT stopped 7/12. PMH includes depression.   OT comments  Education completed with pt's sister re: assisting pt with ADLs, memory deficits, as well as cognitive and behavioral deficits.  ADL checklist provided.  She reports she has handouts previously provided for memory strategies and caregiver assist.  She reports her daughter will be transporting him to [place] today.     Follow Up Recommendations  Home health OT;Supervision/Assistance - 24 hour    Equipment Recommendations  Tub/shower seat    Recommendations for Other Services      Precautions / Restrictions Precautions Precautions: Fall Precaution Comments: impulsive Restrictions Weight Bearing Restrictions: No       Mobility Bed Mobility Overal bed mobility: Needs Assistance Bed Mobility: Supine to Sit;Sit to Supine     Supine to sit: Supervision Sit to supine: Supervision   General bed mobility comments: supervision for safety  Transfers Overall transfer level: Needs assistance Equipment used: None Transfers: Sit to/from Stand Sit to Stand: Supervision         General transfer comment: supervision for safety, no physical assist    Balance     Sitting balance-Leahy Scale: Good       Standing balance-Leahy Scale: Good                             ADL either performed or assessed with clinical judgement   ADL                                                Vision       Perception     Praxis      Cognition Arousal/Alertness: Awake/alert Behavior During Therapy: Impulsive Overall Cognitive Status: Impaired/Different from baseline Area of Impairment: Orientation;Following commands;Safety/judgement;Problem solving;Memory;Attention;Awareness                 Orientation Level: Situation;Time Current Attention Level: Selective Memory: Decreased short-term memory Following Commands: Follows one step commands consistently Safety/Judgement: Decreased awareness of deficits   Problem Solving: Slow processing;Requires verbal cues;Requires tactile cues General Comments: cueing for safety and obstacle navigation        Exercises     Shoulder Instructions       General Comments Spoke with pt's sister who will be providing care for pt in [place].  She will also be riding in car with him.  She reports she has handouts re: memory strategies and caregiver information that was previously provided by other OT.   Discussed memory strategies for home.  Instructed her to acquire an identification bracelet for pt.  Discussed door alarms to reduce risk of wandering.  instructed her to try to establish a daily schedule and stick to the schedule as closely as possible, and keep his environment consistent and organized.    Provided them with an ADL checklist.      Pertinent Vitals/ Pain  Pain Assessment: No/denies pain  Home Living                                          Prior Functioning/Environment              Frequency  Min 2X/week        Progress Toward Goals  OT Goals(current goals can now be found in the care plan section)  Progress towards OT goals: Progressing toward goals  Acute Rehab OT Goals Patient Stated Goal: Sister's goal "for Christopher GlossShelton to come home."  Plan Discharge plan remains appropriate    Co-evaluation                 AM-PAC PT "6 Clicks" Daily Activity      Outcome Measure   Help from another person eating meals?: None Help from another person taking care of personal grooming?: A Little Help from another person toileting, which includes using toliet, bedpan, or urinal?: A Little Help from another person bathing (including washing, rinsing, drying)?: A Little Help from another person to put on and taking off regular upper body clothing?: None Help from another person to put on and taking off regular lower body clothing?: A Little 6 Click Score: 20    End of Session    OT Visit Diagnosis: Unsteadiness on feet (R26.81);Other abnormalities of gait and mobility (R26.89);Muscle weakness (generalized) (M62.81);Cognitive communication deficit (R41.841)   Activity Tolerance Patient tolerated treatment well   Patient Left in bed;with call bell/phone within reach;with chair alarm set   Nurse Communication          Time: [id number] OT Time Calculation (min): 24 min  Charges: OT General Charges $OT Visit: 1 Visit OT Treatments $Self Care/Home Management : 23-37 mins  Reyn[employer] AmericanWendi Dayvian Dominguez, OTR/L [phone]    Jeani HawkingConarpe, Emaly Boschert Dominguez 08/12/2018, 4:38 PM

## 2018-08-13 NOTE — Progress Notes (Signed)
Patient alert and oriented to self, denies pain with assess.  Pending discharge since 08/12/18, patient and visitors at bedside waiting for transport, due to arrive from [place].  Family member, Charlean SanfilippoFantasia Grondin, arrived at [phone].  AVS summary, provided to Ms. Mittal, with thorough education for newly prescribed medications, inpatient medications received, outpatient follow up and symptoms to seek medical attention. Family member verbalized understanding with all instructions as patients advocate.  Patient stable on discharge.

## 2019-01-28 DEATH — deceased

## 2019-07-06 IMAGING — CT CT ABD-PELV W/O CM
2 of 4 series · 17 of 46 positions shown, 19 images · non-contrast
Comparison: 05/09/2016

CLINICAL DATA: Patient unable to provide history. Out of hospital
cardiac arrest.

EXAM:
CT ABDOMEN AND PELVIS WITHOUT CONTRAST
TECHNIQUE: Multidetector CT imaging of the abdomen and pelvis was performed
following the standard protocol without IV contrast.

[Series 3: a/p w/o 5mm · axial · non-contrast · 0.79mm/px · z∈[+890,+1320]mm · 14 of 100 slices shown, 16 images]
[im 7/100  soft-tissue]
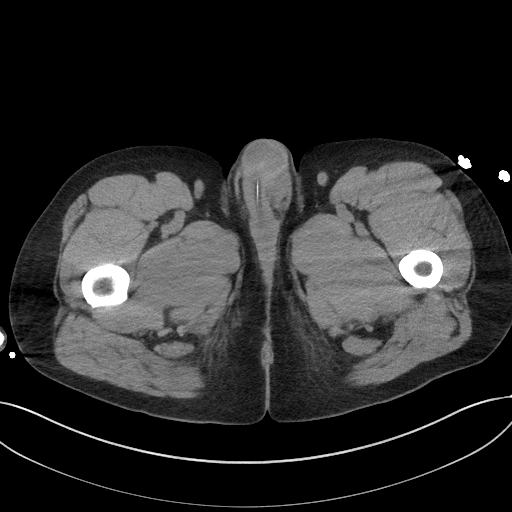
[im 7/100  bone]
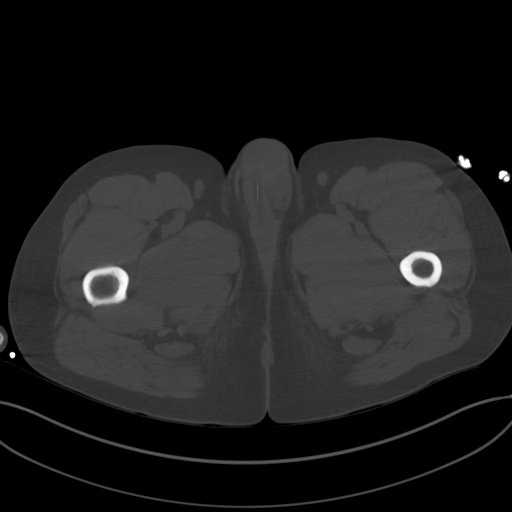
[im 14/100  soft-tissue]
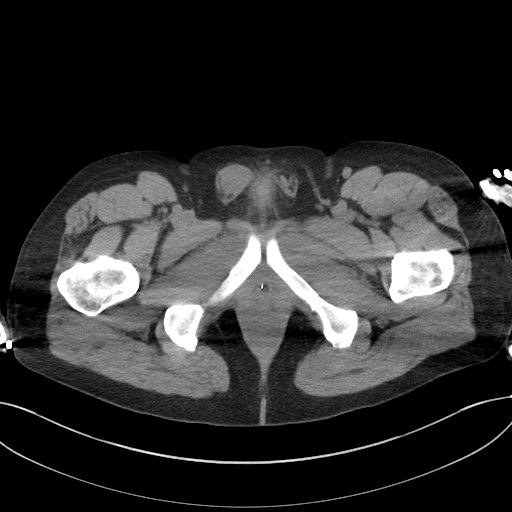
[im 20/100  soft-tissue]
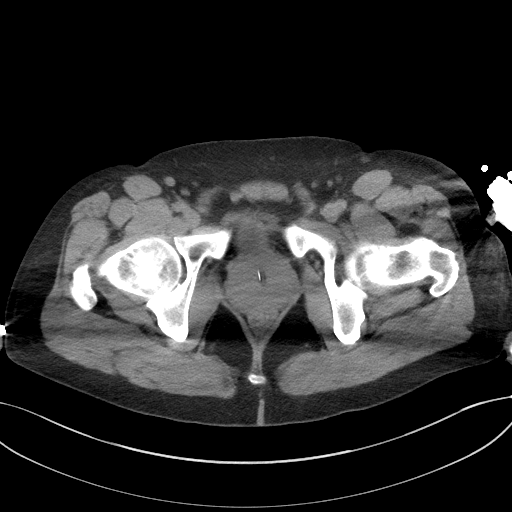
[im 27/100  soft-tissue]
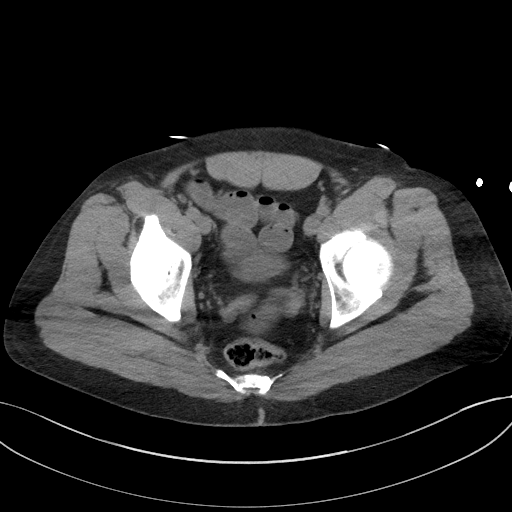
[im 34/100  soft-tissue]
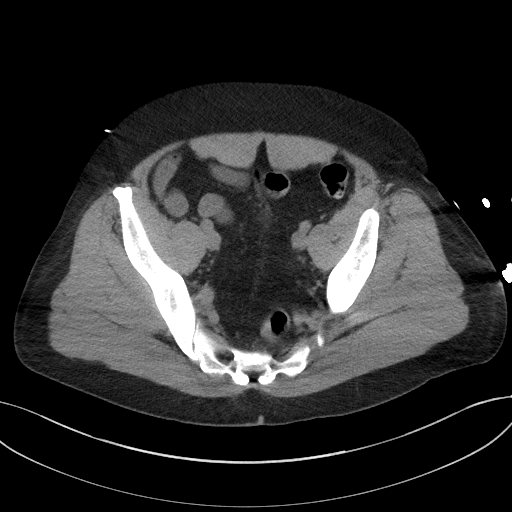
[im 40/100  soft-tissue]
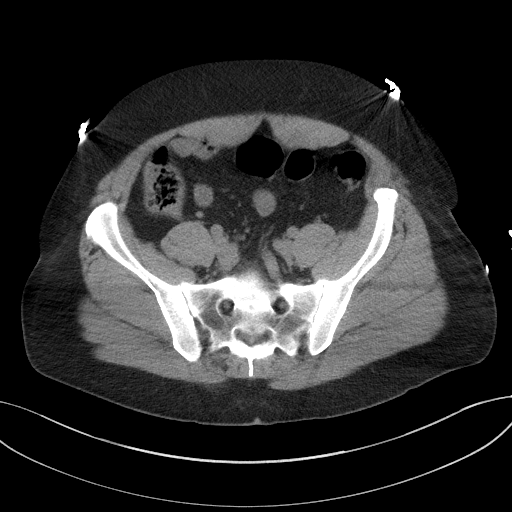
[im 47/100  soft-tissue]
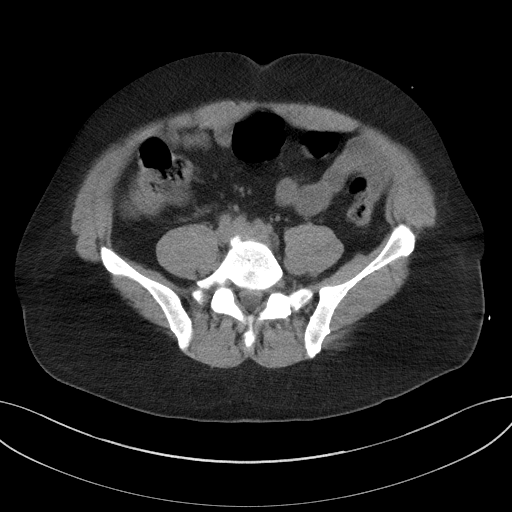
[im 53/100  soft-tissue]
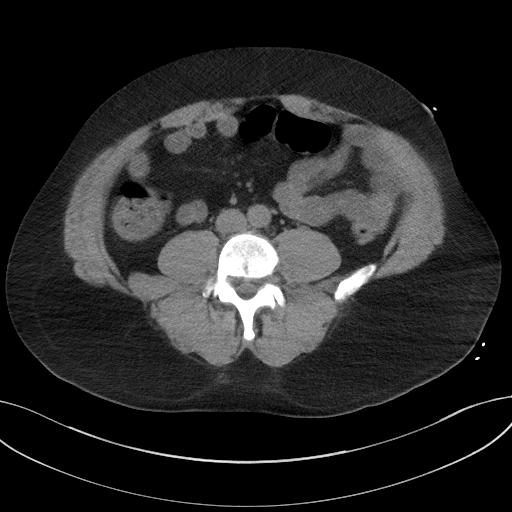
[im 60/100  soft-tissue]
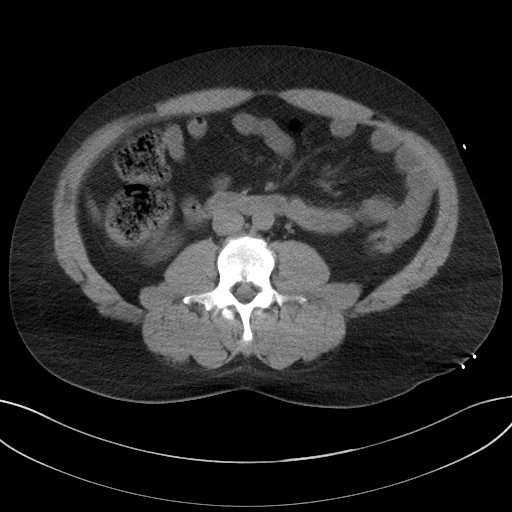
[im 60/100  bone]
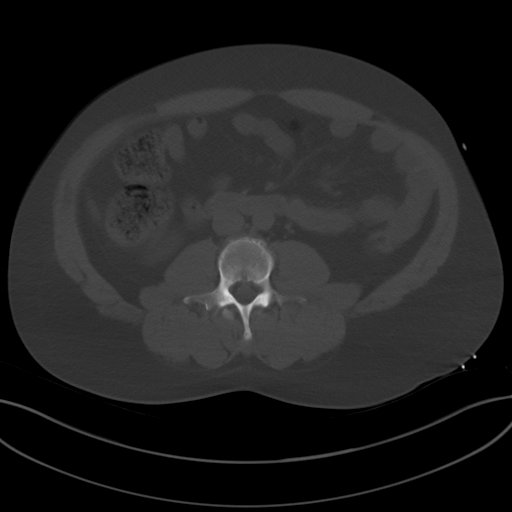
[im 67/100  soft-tissue]
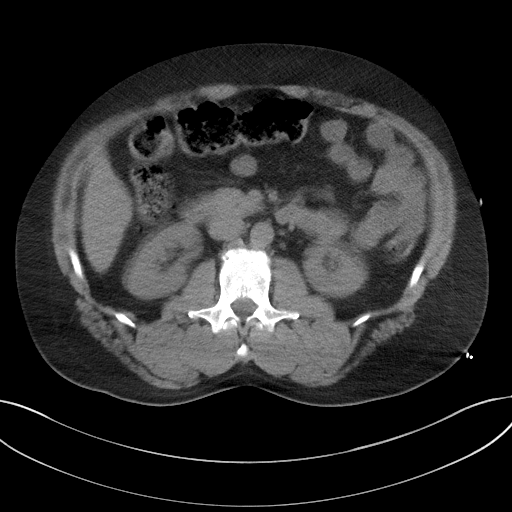
[im 73/100  soft-tissue]
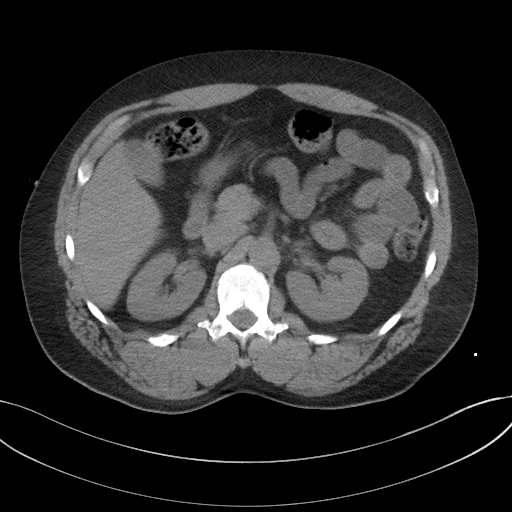
[im 80/100  soft-tissue]
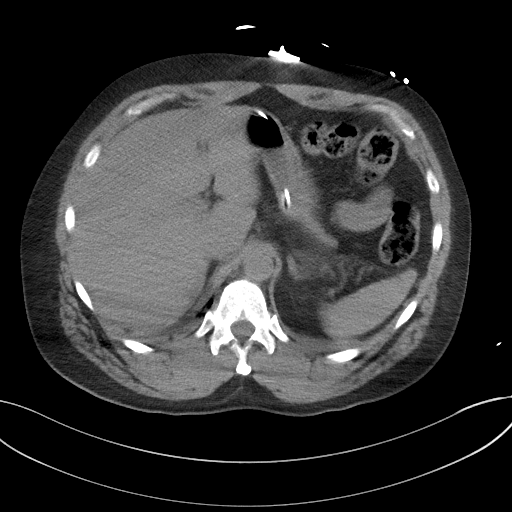
[im 86/100  soft-tissue]
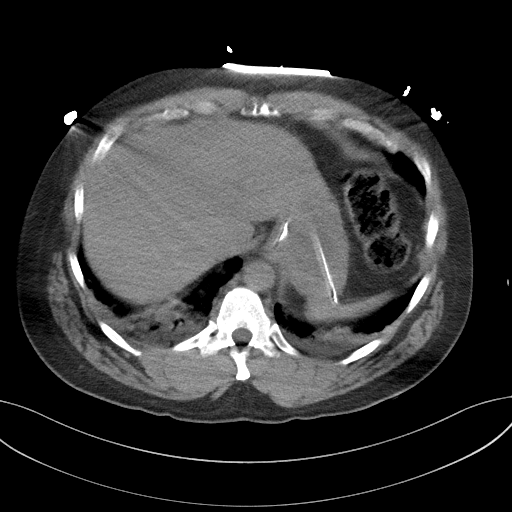
[im 93/100  soft-tissue]
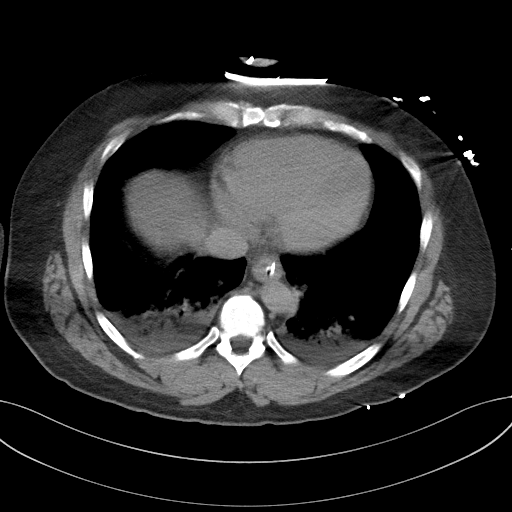

[Series 6: a/p w/o cor · coronal · non-contrast · 0.87mm/px · 3 of 161 slices shown]
[im 54/161  soft-tissue]
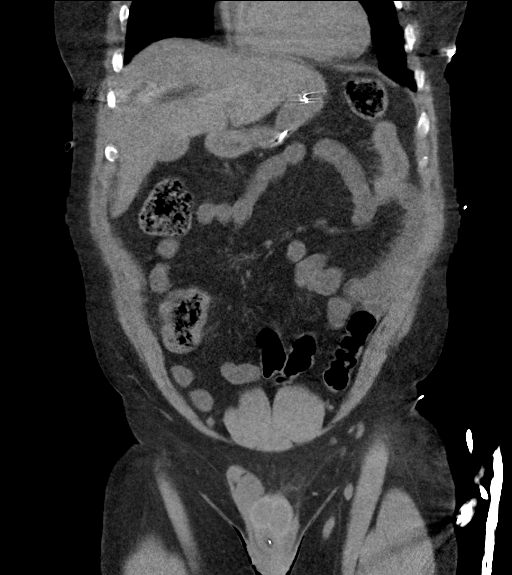
[im 72/161  soft-tissue]
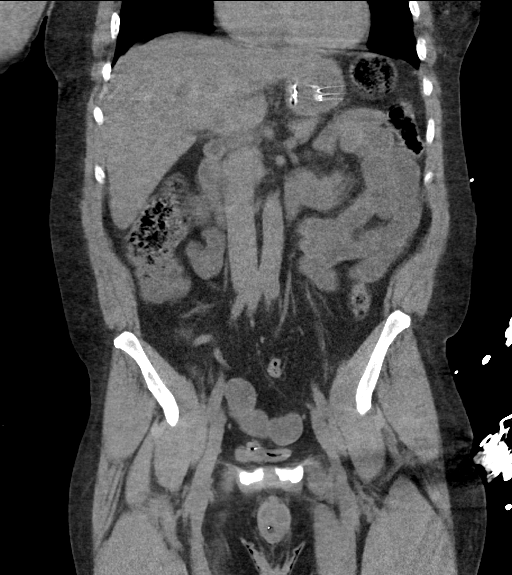
[im 89/161  soft-tissue]
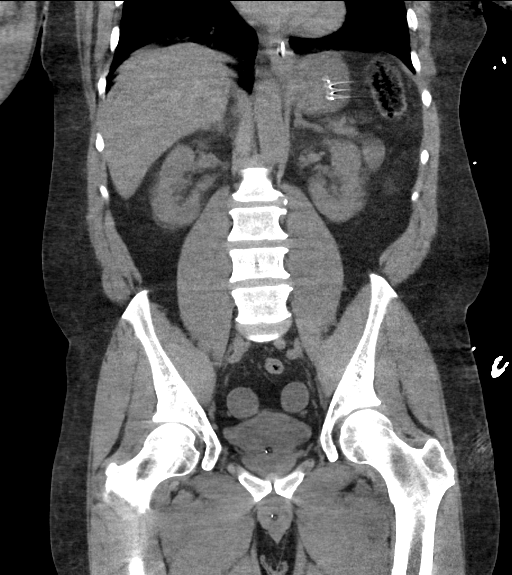

[17 of 46 positions shown; findings below may reference images not displayed]

FINDINGS: Lower chest: Hazy nodule opacification over the right middle lobe.
Posterior bibasilar consolidation likely atelectasis right worse
than left. No effusion.

Hepatobiliary: Within normal.

Pancreas: Normal.

Spleen: Normal.

Adrenals/Urinary Tract: Adrenal glands are normal. Kidneys are
normal in size without hydronephrosis or nephrolithiasis. Ureters
and bladder are unremarkable. Foley catheter is present within the
bladder with a few small foci of air present.

Stomach/Bowel: Nasogastric tube is present which is coiled once in
the stomach with tip pointing posteriorly over the gastric fundus.
Small bowel is normal. Appendix is normal. Colon is normal.

Vascular/Lymphatic: Normal.

Reproductive: Normal.

Other: No free fluid or focal inflammatory change.

Musculoskeletal: Mild spondylosis of the spine.
IMPRESSION: No acute findings in the abdomen/pelvis.

Subtle hazy nodular opacification over the right middle lobe which
may be due to infection or an inflammatory process. Bibasilar
consolidation likely atelectasis right worse than left.

Nasogastric tube coiled once in the stomach with tip over the
gastric fundus directed posteriorly.

## 2019-07-08 IMAGING — DX DG CHEST 1V PORT
1 series · 1 of 1 positions shown · non-contrast
Comparison: 06/30/2018 chest radiograph.

CLINICAL DATA: Central line placement

EXAM:
PORTABLE CHEST 1 VIEW

[chest ap]
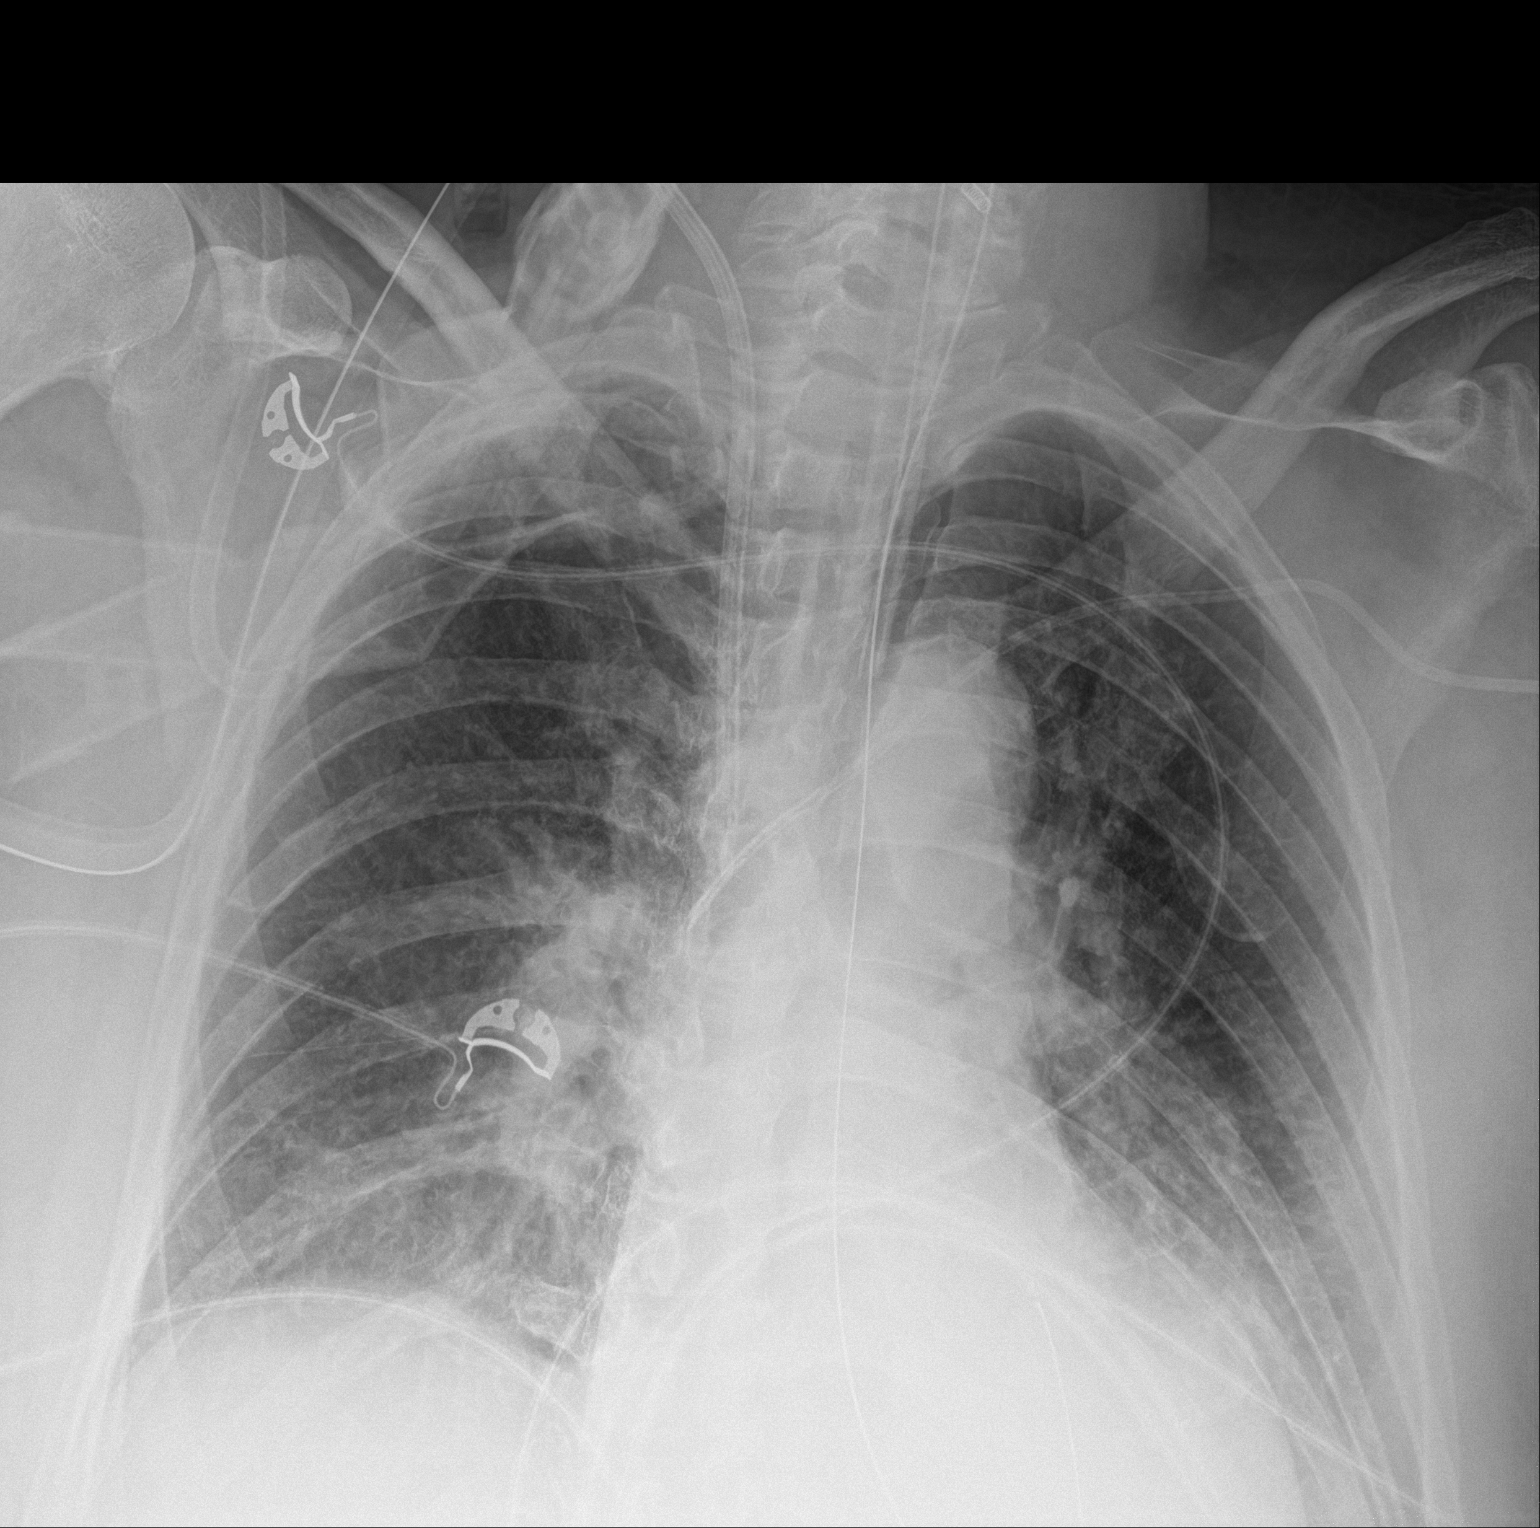

[1 of 1 positions shown; findings below may reference images not displayed]

FINDINGS: Endotracheal tube tip is 3.7 cm above the carina. Enteric tube
terminates in the gastric fundus. Left subclavian central venous
catheter terminates in the middle third of the SVC. Right internal
jugular central venous catheter terminates in the middle third of
the SVC. Stable cardiomediastinal silhouette with top-normal heart
size. No pneumothorax. No large pleural effusions, noting exclusion
of portion of the left costophrenic angle. Hazy parahilar and
bibasilar lung opacities, slightly worsened at the left lung base.
IMPRESSION: 1. No pneumothorax.  Support structures as detailed.
2. Hazy parahilar and bibasilar lung opacities, slightly worsened at
the left lung base, which could represent edema, atelectasis or
pneumonia.

## 2019-07-09 IMAGING — DX DG CHEST 1V PORT
1 series · 1 of 1 positions shown · non-contrast
Comparison: July 02, 2018

CLINICAL DATA: Hypoxia

EXAM:
PORTABLE CHEST 1 VIEW

[chest]
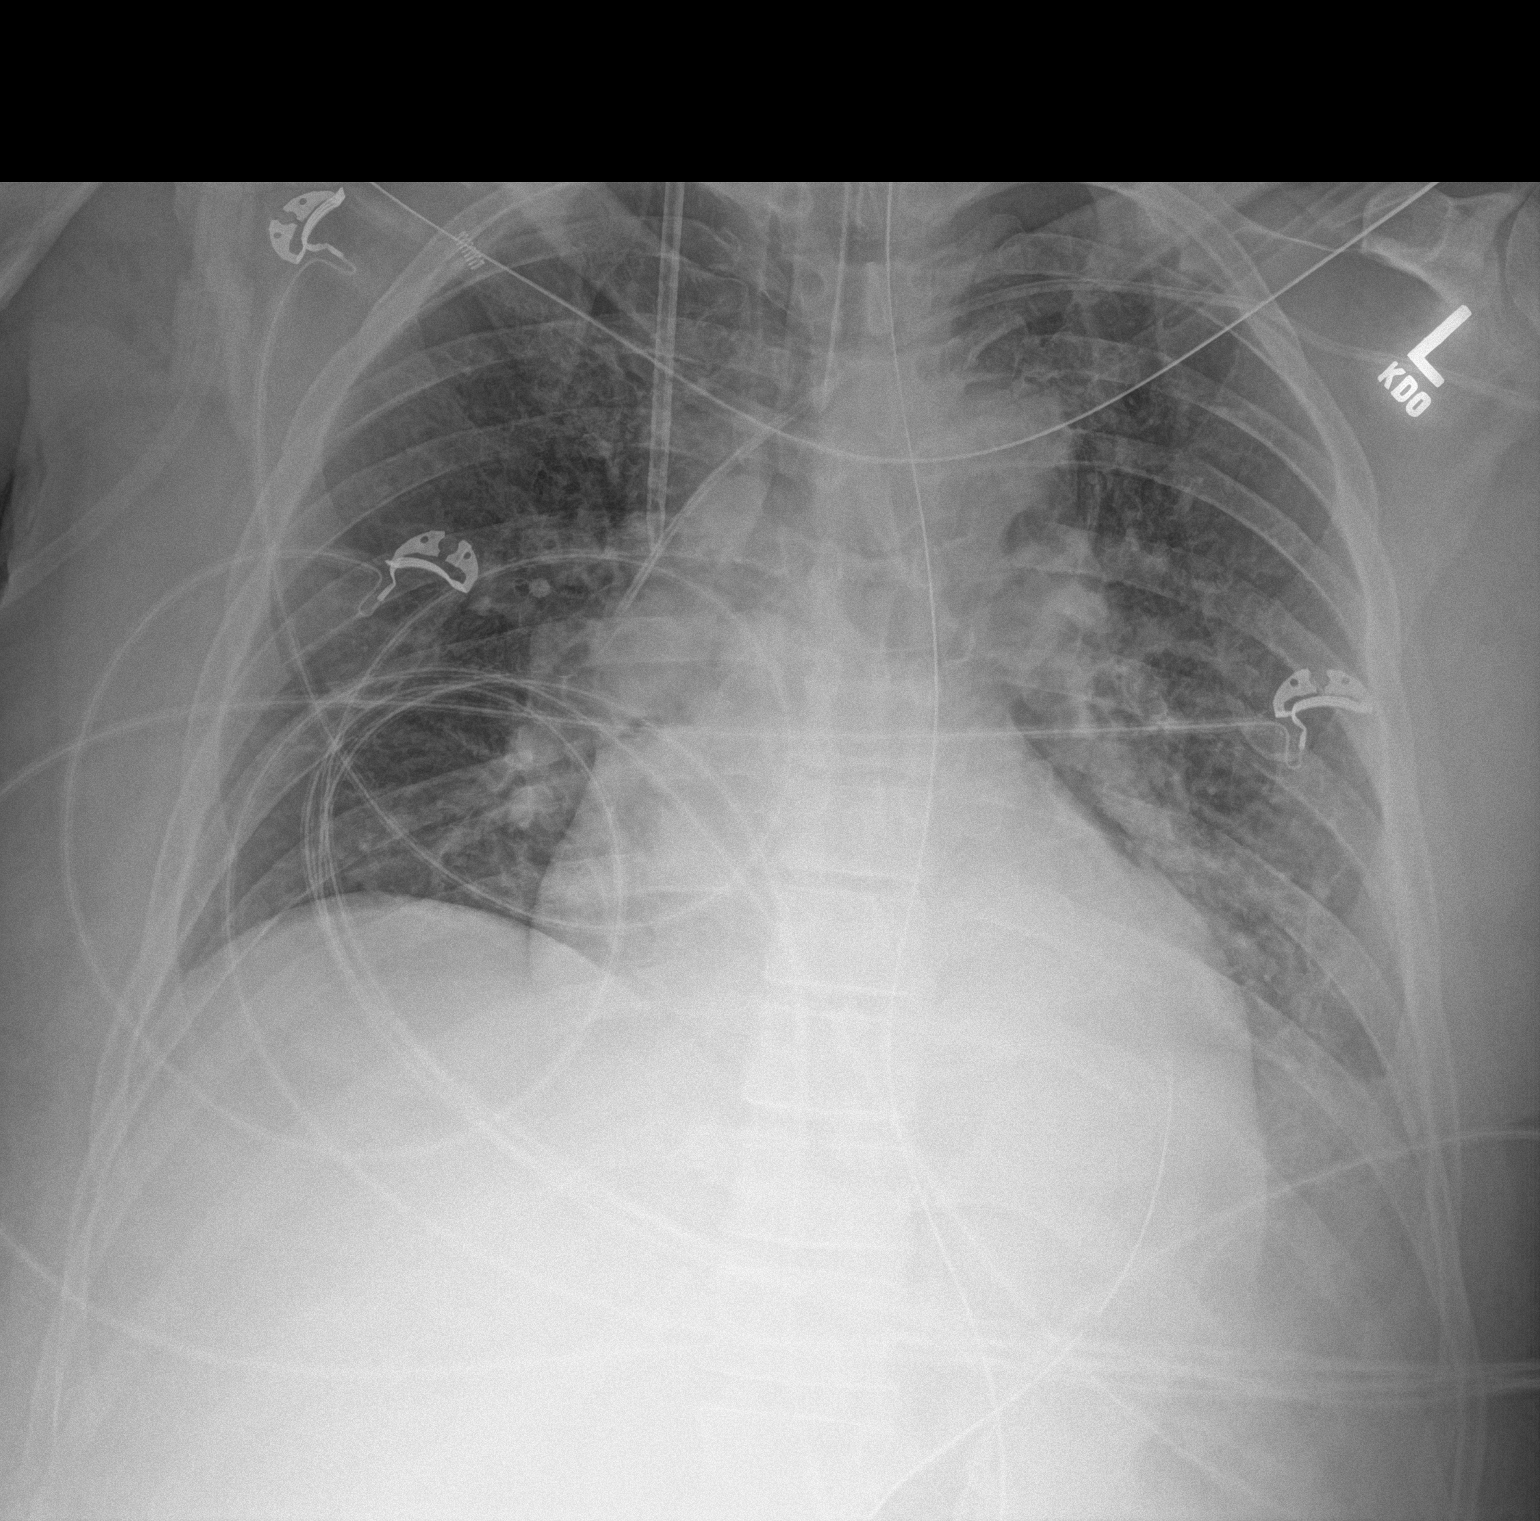

[1 of 1 positions shown; findings below may reference images not displayed]

FINDINGS: Endotracheal tube tip is 3.1 cm above the carina. There is a right
jugular catheter with tip in superior vena cava. There is a left
subclavian catheter with tip in superior vena cava. Nasogastric tube
tip and side port are in the stomach. No pneumothorax. There is
atelectatic change in the left base. The lungs elsewhere are clear.
Heart size is upper normal with pulmonary vascularity normal. No
adenopathy. No bone lesions.
IMPRESSION: Tube and catheter positions as described without pneumothorax. Left
base atelectasis. Lungs elsewhere clear. Stable cardiac prominence.

## 2019-07-10 IMAGING — DX DG CHEST 1V PORT
1 series · 1 of 1 positions shown · non-contrast
Comparison: 07/03/2018

CLINICAL DATA: Acute respiratory failure with hypoxemia

EXAM:
PORTABLE CHEST 1 VIEW

[chest]
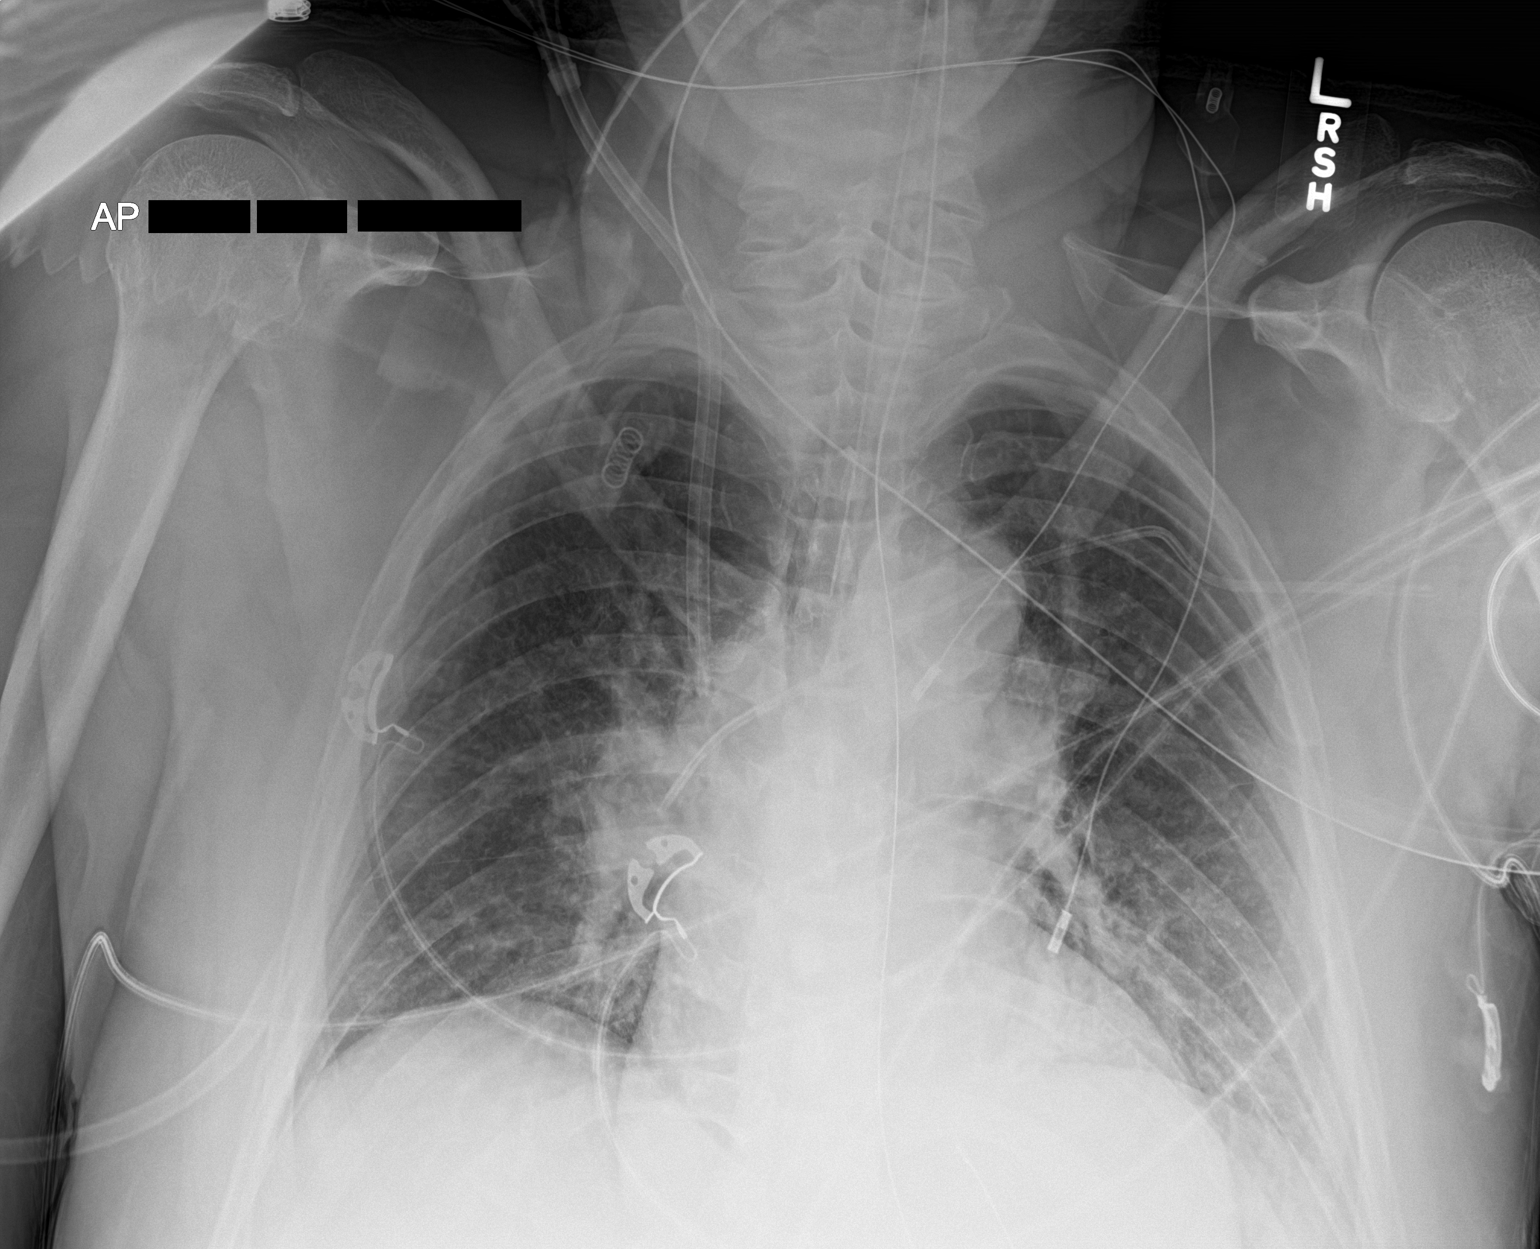

[1 of 1 positions shown; findings below may reference images not displayed]

FINDINGS: Endotracheal tube in good position. Bilateral central venous
catheter tips in the SVC unchanged. NG tube in the stomach

Bibasilar atelectasis/infiltrate unchanged.  No effusion or edema.
IMPRESSION: Support lines remain in good position. Bibasilar
atelectasis/infiltrate with little interval change.
